# Patient Record
Sex: Female | Born: 1940
Health system: Southern US, Community
[De-identification: ages and names within clinical notes are randomized; demographics above are authoritative.]

## PROBLEM LIST (undated history)

## (undated) DIAGNOSIS — M5030 Other cervical disc degeneration, unspecified cervical region: Secondary | ICD-10-CM

## (undated) DIAGNOSIS — J302 Other seasonal allergic rhinitis: Secondary | ICD-10-CM

## (undated) DIAGNOSIS — S82899A Other fracture of unspecified lower leg, initial encounter for closed fracture: Secondary | ICD-10-CM

## (undated) DIAGNOSIS — I10 Essential (primary) hypertension: Secondary | ICD-10-CM

## (undated) DIAGNOSIS — M1990 Unspecified osteoarthritis, unspecified site: Secondary | ICD-10-CM

## (undated) DIAGNOSIS — G43909 Migraine, unspecified, not intractable, without status migrainosus: Secondary | ICD-10-CM

## (undated) DIAGNOSIS — C4492 Squamous cell carcinoma of skin, unspecified: Secondary | ICD-10-CM

## (undated) DIAGNOSIS — R7303 Prediabetes: Secondary | ICD-10-CM

## (undated) DIAGNOSIS — C801 Malignant (primary) neoplasm, unspecified: Secondary | ICD-10-CM

## (undated) DIAGNOSIS — M4800 Spinal stenosis, site unspecified: Secondary | ICD-10-CM

## (undated) DIAGNOSIS — E039 Hypothyroidism, unspecified: Secondary | ICD-10-CM

## (undated) DIAGNOSIS — T7840XA Allergy, unspecified, initial encounter: Secondary | ICD-10-CM

## (undated) DIAGNOSIS — G629 Polyneuropathy, unspecified: Secondary | ICD-10-CM

## (undated) DIAGNOSIS — K5792 Diverticulitis of intestine, part unspecified, without perforation or abscess without bleeding: Secondary | ICD-10-CM

## (undated) DIAGNOSIS — K3580 Unspecified acute appendicitis: Principal | ICD-10-CM

## (undated) DIAGNOSIS — E7800 Pure hypercholesterolemia, unspecified: Secondary | ICD-10-CM

## (undated) DIAGNOSIS — Z8719 Personal history of other diseases of the digestive system: Secondary | ICD-10-CM

## (undated) HISTORY — DX: Squamous cell carcinoma of skin, unspecified: C44.92

## (undated) HISTORY — DX: Diverticulitis of intestine, part unspecified, without perforation or abscess without bleeding: K57.92

## (undated) HISTORY — PX: TONSILLECTOMY: SUR1361

## (undated) HISTORY — DX: Unspecified osteoarthritis, unspecified site: M19.90

## (undated) HISTORY — DX: Allergy, unspecified, initial encounter: T78.40XA

## (undated) HISTORY — PX: CARPAL TUNNEL RELEASE: SHX101

## (undated) HISTORY — PX: OTHER SURGICAL HISTORY: SHX169

## (undated) HISTORY — PX: LAPAROSCOPIC TUBAL LIGATION: SUR803

## (undated) HISTORY — DX: Other seasonal allergic rhinitis: J30.2

## (undated) HISTORY — DX: Polyneuropathy, unspecified: G62.9

## (undated) HISTORY — PX: TUBAL LIGATION: SHX77

## (undated) HISTORY — DX: Spinal stenosis, site unspecified: M48.00

## (undated) HISTORY — PX: GANGLION CYST EXCISION: SHX1691

## (undated) HISTORY — DX: Other cervical disc degeneration, unspecified cervical region: M50.30

## (undated) HISTORY — DX: Unspecified acute appendicitis: K35.80

## (undated) HISTORY — PX: APPENDECTOMY: SHX54

---

## 1976-04-27 HISTORY — PX: THYROIDECTOMY: SHX17

## 1997-11-22 ENCOUNTER — Ambulatory Visit (HOSPITAL_COMMUNITY): Admission: RE | Admit: 1997-11-22 | Discharge: 1997-11-22 | Payer: Self-pay | Admitting: *Deleted

## 1998-08-30 ENCOUNTER — Ambulatory Visit (HOSPITAL_COMMUNITY): Admission: RE | Admit: 1998-08-30 | Discharge: 1998-08-30 | Payer: Self-pay | Admitting: Neurosurgery

## 1998-09-16 ENCOUNTER — Ambulatory Visit (HOSPITAL_COMMUNITY): Admission: RE | Admit: 1998-09-16 | Discharge: 1998-09-16 | Payer: Self-pay | Admitting: Neurosurgery

## 1998-10-04 ENCOUNTER — Ambulatory Visit (HOSPITAL_COMMUNITY): Admission: RE | Admit: 1998-10-04 | Discharge: 1998-10-04 | Payer: Self-pay | Admitting: Neurosurgery

## 1998-11-05 ENCOUNTER — Other Ambulatory Visit: Admission: RE | Admit: 1998-11-05 | Discharge: 1998-11-05 | Payer: Self-pay | Admitting: Neurosurgery

## 1999-08-19 ENCOUNTER — Other Ambulatory Visit: Admission: RE | Admit: 1999-08-19 | Discharge: 1999-08-19 | Payer: Self-pay | Admitting: Family Medicine

## 2004-04-27 HISTORY — PX: ANKLE FUSION: SHX881

## 2004-05-27 ENCOUNTER — Ambulatory Visit (HOSPITAL_BASED_OUTPATIENT_CLINIC_OR_DEPARTMENT_OTHER): Admission: RE | Admit: 2004-05-27 | Discharge: 2004-05-27 | Payer: Self-pay | Admitting: Orthopedic Surgery

## 2004-10-25 DIAGNOSIS — Z8719 Personal history of other diseases of the digestive system: Secondary | ICD-10-CM

## 2004-10-25 HISTORY — DX: Personal history of other diseases of the digestive system: Z87.19

## 2004-11-27 ENCOUNTER — Encounter: Admission: RE | Admit: 2004-11-27 | Discharge: 2004-11-27 | Payer: Self-pay | Admitting: Gastroenterology

## 2007-10-04 ENCOUNTER — Other Ambulatory Visit: Admission: RE | Admit: 2007-10-04 | Discharge: 2007-10-04 | Payer: Self-pay | Admitting: Family Medicine

## 2010-09-12 NOTE — Op Note (Signed)
NAME:  Margaret Guerrero, Margaret Guerrero                   ACCOUNT NO.:  [id number]   MEDICAL RECORD NO.:  [id number]          PATIENT TYPE:  AMB   LOCATION:  DSC                          FACILITY:  [facility]   PHYSICIAN:  Leonides Grills, M.D.     DATE OF BIRTH:  [birth year]   DATE OF PROCEDURE:  05/27/2004  DATE OF DISCHARGE:                                 OPERATIVE REPORT   PREOPERATIVE DIAGNOSES:  1.  Posttraumatic left ankle arthritis.  2.  Left tight Achilles tendon.  3.  Left tibial spurs.  4.  Complication hardware, ankle.  5.  Hardware removal deep, left ankle.   POSTOPERATIVE DIAGNOSES:  1.  Posttraumatic left ankle arthritis.  2.  Left tight Achilles tendon.  3.  Left tibial spurs.  4.  Complication hardware, ankle.  5.  Hardware removal deep, left ankle.   OPERATION:  1.  Left ankle fusion.  2.  Left local bone graft.  3.  Percutaneous left tendo Achillis lengthening.  4.  Stress x-rays of the left ankle.  5.  Excision of left tibial spurs.   ANESTHESIA:  General endotracheal anesthesia.   SURGEON:  Leonides Grills, M.D.   ASSISTANT:  Lianne Cure, P.A.C.   ESTIMATED BLOOD LOSS:  Minimal.   TOURNIQUET TIME:  Approximately 1-1/2 hours.   COMPLICATIONS:  None.   DISPOSITION:  Stable to the PAR.   INDICATIONS:  This is a 70 year old female who had a bimalleolar ankle  fracture who underwent open reduction, internal fixation and postoperatively  she developed advanced arthritis with varus tilt.  She consented to the  above procedure.  All risks which include infection, neurovessel injury,  nonunion, malunion, heart or irritation heart failure, precipitation  worsening, precipitation of arthritis, neighboring joints are all explained.  Questions were encouraged to answer.   PROCEDURE:  The patient was brought to the operating room and placed in the  supine position after adequate general endotracheal anesthesia was  administered as well as Ancef 1 gm IV piggyback.  The left lower  extremity  is then prepped and draped in a sterile manner over proximally placed  tourniquet; limb was gravity exsanguinated, and tourniquet was elevated to  290 mmHg.  A longitudinal incision over the lateral aspect of the ankle  which was previously made and done.  Dissection was carried down directly to  bone.  The plate and screws were then identified as well as the K-wire.  These were all removed. They were small-fragment screws. After the hardware  was removed we then measured about 6 cm proximal to the tip of the lateral  malleolus.  An osteotomy was then made. A block of bone was then resected.   The syndesmotic ligament was then released.  The fibula was then externally  rotated.  The soft tissue was then removed from the lateral aspect of the  tibia and multiple drill holes were then placed. Cartilage was then removed  from within the ankle joint with curette as well as curved 4-inch osteotome  and synovectomy rongeur.  Once this was all removed and denuded of cartilage  multiple 10-mm drill holes were placed on either side of the joint,  respectively.  The anterior distal osteophyte off the tibia was then removed  with the curved 4-inch osteotome and a rongeur that took quite some time.  This was protecting the soft tissues anteriorly as well. Once this was  completely removed we then performed a percutaneous tendo Achillis  lengthening with 2 medial and 1 lateral hemi-sections of the Achilles  tendon.  This had excellent release of the tight Achilles tendon.   After this was removed, we then dorsiflexed the ankle and posteriorly  translated talus and provisionally fixed this with a 2.0-mm K-wire.  This  was found, under C-arm guidance, in the AP planes to be in excellent  position.   A longitudinal incision was made over the medial aspect of the ankle with  nick-and-spread technique was utilized down to the metaphyseal curvature of  the medial aspect of the tibia.  Then  4.5/3.5-mm drill holes were placed.  Then a 16-mm, thread 6.5-mm cancellous screw was then placed.  This had  excellent compression across the arthrodesis site.   We then removed the 2-0 K-wire and replaced this with a 6.5-mm/16-mm  threaded cancellous screws using 4.5/3.2-mm drill holes respectively.  This  had excellent purchase and maintenance of the correction. We then took a  sagittal saw and cut the fibula in a sagittal plane removing the medial  third of the fibula.  This was used as local bone graft as well as the  osteophytes taken from the distal anterior tibia, as well as from drill bits  as well.  We then opposed the laterally malleolus as a biological plate and  placed two, 33-mm/6.5-mm cancellous screws. This had excellent purchase and  compression as a biological plate.  Ines Bloomer was used to create stress-straining  relieving areas and local bone graft was packed into all these areas as  well.  Final stress x-rays were obtained, AP and lateral mortise views.  There was no gross motion across the arthrodesis site.  Fixation was in a  proper position as well.  The area was copiously irrigated with normal  saline.  Tourniquet was deflated, hemostasis was obtained.  Subcu was closed  with 3-0 Vicryl, skin was closed with 4-0 nylon.  Sterile dressing was  applied.  Modified Jones dressing was applied.  The patient was stable to  PAR.      PB/MEDQ  D:  05/27/2004  T:  05/27/2004  Job:  [id number]

## 2012-04-25 ENCOUNTER — Inpatient Hospital Stay: Admit: 2012-04-25 | Payer: Self-pay | Admitting: Orthopedic Surgery

## 2012-04-25 SURGERY — ARTHROPLASTY, KNEE, TOTAL
Anesthesia: Spinal | Laterality: Right

## 2012-04-25 SURGERY — Surgical Case
Anesthesia: *Unknown

## 2012-08-21 ENCOUNTER — Encounter (HOSPITAL_COMMUNITY): Payer: Self-pay | Admitting: Emergency Medicine

## 2012-08-21 ENCOUNTER — Emergency Department (HOSPITAL_COMMUNITY): Payer: Federal, State, Local not specified - PPO

## 2012-08-21 ENCOUNTER — Encounter (HOSPITAL_COMMUNITY)
Admission: EM | Disposition: A | Discharge status: Home / Self Care | Payer: Self-pay | Source: Home / Self Care | Attending: Emergency Medicine

## 2012-08-21 ENCOUNTER — Observation Stay (HOSPITAL_COMMUNITY)
Admission: EM | Admit: 2012-08-21 | Discharge: 2012-08-22 | Disposition: A | Discharge status: Home / Self Care | Payer: Federal, State, Local not specified - PPO | Attending: Surgery | Admitting: Surgery

## 2012-08-21 ENCOUNTER — Emergency Department (HOSPITAL_COMMUNITY): Payer: Federal, State, Local not specified - PPO | Admitting: Anesthesiology

## 2012-08-21 ENCOUNTER — Encounter (HOSPITAL_COMMUNITY): Payer: Self-pay | Admitting: Anesthesiology

## 2012-08-21 DIAGNOSIS — E7800 Pure hypercholesterolemia, unspecified: Secondary | ICD-10-CM | POA: Insufficient documentation

## 2012-08-21 DIAGNOSIS — I10 Essential (primary) hypertension: Secondary | ICD-10-CM | POA: Insufficient documentation

## 2012-08-21 DIAGNOSIS — K3580 Unspecified acute appendicitis: Secondary | ICD-10-CM

## 2012-08-21 DIAGNOSIS — Z79899 Other long term (current) drug therapy: Secondary | ICD-10-CM | POA: Insufficient documentation

## 2012-08-21 DIAGNOSIS — R1031 Right lower quadrant pain: Secondary | ICD-10-CM | POA: Insufficient documentation

## 2012-08-21 HISTORY — PX: LAPAROSCOPIC APPENDECTOMY: SHX408

## 2012-08-21 HISTORY — DX: Other fracture of unspecified lower leg, initial encounter for closed fracture: S82.899A

## 2012-08-21 HISTORY — DX: Essential (primary) hypertension: I10

## 2012-08-21 HISTORY — DX: Unspecified acute appendicitis: K35.80

## 2012-08-21 HISTORY — DX: Hypothyroidism, unspecified: E03.9

## 2012-08-21 HISTORY — DX: Pure hypercholesterolemia, unspecified: E78.00

## 2012-08-21 LAB — URINALYSIS, ROUTINE W REFLEX MICROSCOPIC
Bilirubin Urine: NEGATIVE
Hgb urine dipstick: NEGATIVE
Nitrite: NEGATIVE
Specific Gravity, Urine: 1.019 (ref 1.005–1.030)
pH: 6.5 (ref 5.0–8.0)

## 2012-08-21 LAB — CBC WITH DIFFERENTIAL/PLATELET
Basophils Absolute: 0 10*3/uL (ref 0.0–0.1)
Basophils Relative: 0 % (ref 0–1)
Eosinophils Absolute: 0 10*3/uL (ref 0.0–0.7)
MCH: 28 pg (ref 26.0–34.0)
MCHC: 33.6 g/dL (ref 30.0–36.0)
Neutrophils Relative %: 88 % — ABNORMAL HIGH (ref 43–77)
Platelets: 215 10*3/uL (ref 150–400)
RBC: 4.9 MIL/uL (ref 3.87–5.11)
RDW: 13.7 % (ref 11.5–15.5)

## 2012-08-21 LAB — COMPREHENSIVE METABOLIC PANEL
ALT: 14 U/L (ref 0–35)
Albumin: 4 g/dL (ref 3.5–5.2)
Alkaline Phosphatase: 68 U/L (ref 39–117)
Potassium: 4.1 mEq/L (ref 3.5–5.1)
Sodium: 134 mEq/L — ABNORMAL LOW (ref 135–145)
Total Protein: 7.7 g/dL (ref 6.0–8.3)

## 2012-08-21 LAB — LIPASE, BLOOD: Lipase: 29 U/L (ref 11–59)

## 2012-08-21 SURGERY — APPENDECTOMY, LAPAROSCOPIC
Anesthesia: General | Site: Abdomen | Wound class: Clean Contaminated

## 2012-08-21 MED ORDER — LACTATED RINGERS IR SOLN
Status: DC | PRN
  Administered 2012-08-21: 1000 mL
  Administered 2012-08-21: 3000 mL

## 2012-08-21 MED ORDER — METOPROLOL TARTRATE 12.5 MG HALF TABLET
12.5000 mg | ORAL_TABLET | Freq: Two times a day (BID) | ORAL | Status: DC | PRN
  Filled 2012-08-21: qty 1

## 2012-08-21 MED ORDER — SODIUM CHLORIDE 0.9 % IV SOLN
INTRAVENOUS | Status: DC
  Administered 2012-08-21: 11:00:00 via INTRAVENOUS

## 2012-08-21 MED ORDER — SUCCINYLCHOLINE CHLORIDE 20 MG/ML IJ SOLN
INTRAMUSCULAR | Status: DC | PRN
  Administered 2012-08-21: 60 mg via INTRAVENOUS

## 2012-08-21 MED ORDER — VITAMIN C 250 MG PO TABS
250.0000 mg | ORAL_TABLET | Freq: Every day | ORAL | Status: DC
  Administered 2012-08-21: 250 mg via ORAL
  Filled 2012-08-21 (×2): qty 1

## 2012-08-21 MED ORDER — HYDROMORPHONE HCL PF 1 MG/ML IJ SOLN: 0.5000 mg | INTRAMUSCULAR | Status: DC | PRN

## 2012-08-21 MED ORDER — GENTAMICIN SULFATE 40 MG/ML IJ SOLN
480.0000 mg | INTRAMUSCULAR | Status: DC
  Administered 2012-08-21: 480 mg via INTRAVENOUS
  Filled 2012-08-21 (×2): qty 12

## 2012-08-21 MED ORDER — CISATRACURIUM BESYLATE (PF) 10 MG/5ML IV SOLN
INTRAVENOUS | Status: DC | PRN
  Administered 2012-08-21: 4 mg via INTRAVENOUS

## 2012-08-21 MED ORDER — ONDANSETRON HCL 4 MG/2ML IJ SOLN: 4.0000 mg | Freq: Four times a day (QID) | INTRAMUSCULAR | Status: DC | PRN

## 2012-08-21 MED ORDER — CYCLOBENZAPRINE HCL 10 MG PO TABS: 10.0000 mg | ORAL_TABLET | Freq: Three times a day (TID) | ORAL | Status: DC | PRN

## 2012-08-21 MED ORDER — CLINDAMYCIN PHOSPHATE 600 MG/50ML IV SOLN
600.0000 mg | Freq: Four times a day (QID) | INTRAVENOUS | Status: DC
  Administered 2012-08-21 – 2012-08-22 (×3): 600 mg via INTRAVENOUS
  Filled 2012-08-21 (×5): qty 50

## 2012-08-21 MED ORDER — SODIUM CHLORIDE 0.9 % IJ SOLN: 3.0000 mL | INTRAMUSCULAR | Status: DC | PRN

## 2012-08-21 MED ORDER — NEOSTIGMINE METHYLSULFATE 1 MG/ML IJ SOLN
INTRAMUSCULAR | Status: DC | PRN
  Administered 2012-08-21: 3.5 mg via INTRAVENOUS

## 2012-08-21 MED ORDER — ONDANSETRON 4 MG PO TBDP: 4.0000 mg | ORAL_TABLET | Freq: Once | ORAL | Status: DC

## 2012-08-21 MED ORDER — HYDROMORPHONE HCL PF 1 MG/ML IJ SOLN
INTRAMUSCULAR | Status: DC | PRN
  Administered 2012-08-21 (×2): .4 mg via INTRAVENOUS

## 2012-08-21 MED ORDER — LACTATED RINGERS IV SOLN
INTRAVENOUS | Status: DC | PRN
  Administered 2012-08-21: 16:00:00 via INTRAVENOUS

## 2012-08-21 MED ORDER — ONDANSETRON HCL 4 MG/2ML IJ SOLN
INTRAMUSCULAR | Status: DC | PRN
  Administered 2012-08-21: 4 mg via INTRAVENOUS

## 2012-08-21 MED ORDER — FENTANYL CITRATE 0.05 MG/ML IJ SOLN
25.0000 ug | INTRAMUSCULAR | Status: DC | PRN
Start: 2012-08-21 — End: 2012-08-21

## 2012-08-21 MED ORDER — HYDROMORPHONE HCL PF 1 MG/ML IJ SOLN
0.5000 mg | Freq: Once | INTRAMUSCULAR | Status: AC
  Administered 2012-08-21: 0.5 mg via INTRAVENOUS
  Filled 2012-08-21: qty 1

## 2012-08-21 MED ORDER — GLYCOPYRROLATE 0.2 MG/ML IJ SOLN
INTRAMUSCULAR | Status: DC | PRN
  Administered 2012-08-21: .5 mg via INTRAVENOUS

## 2012-08-21 MED ORDER — IOHEXOL 300 MG/ML  SOLN
50.0000 mL | Freq: Once | INTRAMUSCULAR | Status: AC | PRN
  Administered 2012-08-21: 50 mL via ORAL

## 2012-08-21 MED ORDER — ACETAMINOPHEN 10 MG/ML IV SOLN
INTRAVENOUS | Status: AC
  Filled 2012-08-21: qty 100

## 2012-08-21 MED ORDER — BUPIVACAINE-EPINEPHRINE 0.25% -1:200000 IJ SOLN
INTRAMUSCULAR | Status: AC
  Filled 2012-08-21: qty 1

## 2012-08-21 MED ORDER — CLINDAMYCIN PHOSPHATE 600 MG/50ML IV SOLN: 600.0000 mg | INTRAVENOUS | Status: DC

## 2012-08-21 MED ORDER — FENTANYL CITRATE 0.05 MG/ML IJ SOLN: 25.0000 ug | INTRAMUSCULAR | Status: DC | PRN

## 2012-08-21 MED ORDER — SACCHAROMYCES BOULARDII 250 MG PO CAPS
250.0000 mg | ORAL_CAPSULE | Freq: Two times a day (BID) | ORAL | Status: DC
  Administered 2012-08-21 – 2012-08-22 (×2): 250 mg via ORAL
  Filled 2012-08-21 (×3): qty 1

## 2012-08-21 MED ORDER — GENTAMICIN SULFATE 40 MG/ML IJ SOLN: 2.0000 mg/kg | Freq: Two times a day (BID) | INTRAVENOUS | Status: DC

## 2012-08-21 MED ORDER — CHLORHEXIDINE GLUCONATE 4 % EX LIQD
1.0000 "application " | Freq: Once | CUTANEOUS | Status: DC
  Filled 2012-08-21: qty 15

## 2012-08-21 MED ORDER — CALCIUM CARBONATE 1250 (500 CA) MG PO TABS
1.0000 | ORAL_TABLET | Freq: Every day | ORAL | Status: DC
  Administered 2012-08-22: 500 mg via ORAL
  Filled 2012-08-21: qty 1

## 2012-08-21 MED ORDER — ONDANSETRON HCL 4 MG PO TABS: 4.0000 mg | ORAL_TABLET | Freq: Four times a day (QID) | ORAL | Status: DC | PRN

## 2012-08-21 MED ORDER — OXYCODONE HCL 5 MG PO TABS
5.0000 mg | ORAL_TABLET | ORAL | Status: DC | PRN
  Administered 2012-08-22: 5 mg via ORAL
  Filled 2012-08-21 (×2): qty 1

## 2012-08-21 MED ORDER — FENTANYL CITRATE 0.05 MG/ML IJ SOLN
INTRAMUSCULAR | Status: DC | PRN
  Administered 2012-08-21: 100 ug via INTRAVENOUS

## 2012-08-21 MED ORDER — PROPOFOL 10 MG/ML IV BOLUS
INTRAVENOUS | Status: DC | PRN
  Administered 2012-08-21: 130 mg via INTRAVENOUS

## 2012-08-21 MED ORDER — ACETAMINOPHEN 325 MG PO TABS: 325.0000 mg | ORAL_TABLET | Freq: Four times a day (QID) | ORAL | Status: DC | PRN

## 2012-08-21 MED ORDER — DEXTROSE IN LACTATED RINGERS 5 % IV SOLN: INTRAVENOUS | Status: DC

## 2012-08-21 MED ORDER — METRONIDAZOLE IN NACL 5-0.79 MG/ML-% IV SOLN: 500.0000 mg | Freq: Once | INTRAVENOUS | Status: DC

## 2012-08-21 MED ORDER — MAGNESIUM HYDROXIDE 400 MG/5ML PO SUSP: 30.0000 mL | Freq: Two times a day (BID) | ORAL | Status: DC | PRN

## 2012-08-21 MED ORDER — SODIUM CHLORIDE 0.9 % IV SOLN: 250.0000 mL | INTRAVENOUS | Status: DC | PRN

## 2012-08-21 MED ORDER — ACETAMINOPHEN 10 MG/ML IV SOLN
INTRAVENOUS | Status: DC | PRN
  Administered 2012-08-21: 1000 mg via INTRAVENOUS

## 2012-08-21 MED ORDER — IOHEXOL 300 MG/ML  SOLN
100.0000 mL | Freq: Once | INTRAMUSCULAR | Status: AC | PRN
  Administered 2012-08-21: 100 mL via INTRAVENOUS

## 2012-08-21 MED ORDER — LACTATED RINGERS IV BOLUS (SEPSIS): 1000.0000 mL | Freq: Three times a day (TID) | INTRAVENOUS | Status: DC | PRN

## 2012-08-21 MED ORDER — LIDOCAINE HCL (CARDIAC) 20 MG/ML IV SOLN
INTRAVENOUS | Status: DC | PRN
  Administered 2012-08-21: 100 mg via INTRAVENOUS

## 2012-08-21 MED ORDER — BISACODYL 10 MG RE SUPP: 10.0000 mg | Freq: Two times a day (BID) | RECTAL | Status: DC | PRN

## 2012-08-21 MED ORDER — ALUM & MAG HYDROXIDE-SIMETH 200-200-20 MG/5ML PO SUSP: 30.0000 mL | Freq: Four times a day (QID) | ORAL | Status: DC | PRN

## 2012-08-21 MED ORDER — GENTAMICIN IN SALINE 1-0.9 MG/ML-% IV SOLN
100.0000 mg | INTRAVENOUS | Status: AC
  Administered 2012-08-21: 100 mg via INTRAVENOUS
  Filled 2012-08-21: qty 100

## 2012-08-21 MED ORDER — DEXTROSE 5 % IV SOLN: 1.0000 g | Freq: Once | INTRAVENOUS | Status: DC

## 2012-08-21 MED ORDER — CLINDAMYCIN PHOSPHATE 600 MG/50ML IV SOLN
600.0000 mg | INTRAVENOUS | Status: AC
  Administered 2012-08-21: 600 mg via INTRAVENOUS
  Filled 2012-08-21: qty 50

## 2012-08-21 MED ORDER — LIP MEDEX EX OINT: 1.0000 "application " | TOPICAL_OINTMENT | Freq: Two times a day (BID) | CUTANEOUS | Status: DC

## 2012-08-21 MED ORDER — KCL IN DEXTROSE-NACL 40-5-0.9 MEQ/L-%-% IV SOLN
INTRAVENOUS | Status: DC
  Administered 2012-08-21: 19:00:00 via INTRAVENOUS
  Filled 2012-08-21 (×4): qty 1000

## 2012-08-21 MED ORDER — BUPIVACAINE-EPINEPHRINE 0.25% -1:200000 IJ SOLN
INTRAMUSCULAR | Status: DC | PRN
  Administered 2012-08-21: 50 mL

## 2012-08-21 MED ORDER — IBUPROFEN 800 MG PO TABS
800.0000 mg | ORAL_TABLET | Freq: Four times a day (QID) | ORAL | Status: DC
  Administered 2012-08-21 – 2012-08-22 (×4): 800 mg via ORAL
  Filled 2012-08-21 (×6): qty 1

## 2012-08-21 MED ORDER — ONDANSETRON HCL 4 MG/2ML IJ SOLN
4.0000 mg | Freq: Once | INTRAMUSCULAR | Status: AC
  Administered 2012-08-21: 4 mg via INTRAVENOUS
  Filled 2012-08-21: qty 2

## 2012-08-21 MED ORDER — HEPARIN SODIUM (PORCINE) 5000 UNIT/ML IJ SOLN
5000.0000 [IU] | Freq: Three times a day (TID) | INTRAMUSCULAR | Status: DC
  Administered 2012-08-22: 5000 [IU] via SUBCUTANEOUS
  Filled 2012-08-21 (×4): qty 1

## 2012-08-21 MED ORDER — MAGIC MOUTHWASH
15.0000 mL | Freq: Four times a day (QID) | ORAL | Status: DC | PRN
  Filled 2012-08-21: qty 15

## 2012-08-21 MED ORDER — ADULT MULTIVITAMIN W/MINERALS CH
1.0000 | ORAL_TABLET | Freq: Every day | ORAL | Status: DC
  Administered 2012-08-22: 1 via ORAL
  Filled 2012-08-21: qty 1

## 2012-08-21 MED ORDER — SODIUM CHLORIDE 0.9 % IJ SOLN: 3.0000 mL | Freq: Two times a day (BID) | INTRAMUSCULAR | Status: DC

## 2012-08-21 MED ORDER — ACETAMINOPHEN 650 MG RE SUPP: 650.0000 mg | Freq: Four times a day (QID) | RECTAL | Status: DC | PRN

## 2012-08-21 MED ORDER — PROMETHAZINE HCL 25 MG/ML IJ SOLN: 6.2500 mg | INTRAMUSCULAR | Status: DC | PRN

## 2012-08-21 MED ORDER — KETOROLAC TROMETHAMINE 30 MG/ML IJ SOLN: 15.0000 mg | Freq: Once | INTRAMUSCULAR | Status: DC | PRN

## 2012-08-21 MED ORDER — PROMETHAZINE HCL 25 MG/ML IJ SOLN: 12.5000 mg | Freq: Four times a day (QID) | INTRAMUSCULAR | Status: DC | PRN

## 2012-08-21 MED ORDER — LEVOTHYROXINE SODIUM 50 MCG PO TABS
50.0000 ug | ORAL_TABLET | Freq: Every day | ORAL | Status: DC
  Administered 2012-08-22: 50 ug via ORAL
  Filled 2012-08-21 (×3): qty 1

## 2012-08-21 MED ORDER — LISINOPRIL 10 MG PO TABS
10.0000 mg | ORAL_TABLET | Freq: Every day | ORAL | Status: DC
  Administered 2012-08-22: 10 mg via ORAL
  Filled 2012-08-21: qty 1

## 2012-08-21 MED ORDER — GENTAMICIN IN SALINE 1-0.9 MG/ML-% IV SOLN: 100.0000 mg | INTRAVENOUS | Status: DC

## 2012-08-21 MED ORDER — DIPHENHYDRAMINE HCL 50 MG/ML IJ SOLN: 12.5000 mg | Freq: Four times a day (QID) | INTRAMUSCULAR | Status: DC | PRN

## 2012-08-21 MED ORDER — KETOROLAC TROMETHAMINE 15 MG/ML IJ SOLN
INTRAMUSCULAR | Status: DC | PRN
  Administered 2012-08-21: 15 mg via INTRAVENOUS

## 2012-08-21 SURGICAL SUPPLY — 42 items
APPLIER CLIP 5 13 M/L LIGAMAX5 (MISCELLANEOUS)
APPLIER CLIP ROT 10 11.4 M/L (STAPLE)
CANISTER SUCTION 2500CC (MISCELLANEOUS) ×2 IMPLANT
CLIP APPLIE 5 13 M/L LIGAMAX5 (MISCELLANEOUS) IMPLANT
CLIP APPLIE ROT 10 11.4 M/L (STAPLE) IMPLANT
CLOTH BEACON ORANGE TIMEOUT ST (SAFETY) ×2 IMPLANT
CUTTER FLEX LINEAR 45M (STAPLE) ×2 IMPLANT
DECANTER SPIKE VIAL GLASS SM (MISCELLANEOUS) ×2 IMPLANT
DRAPE LAPAROSCOPIC ABDOMINAL (DRAPES) ×2 IMPLANT
DRAPE UTILITY XL STRL (DRAPES) ×2 IMPLANT
DRSG TEGADERM 2-3/8X2-3/4 SM (GAUZE/BANDAGES/DRESSINGS) ×2 IMPLANT
DRSG TEGADERM 4X4.75 (GAUZE/BANDAGES/DRESSINGS) ×2 IMPLANT
ELECT REM PT RETURN 9FT ADLT (ELECTROSURGICAL) ×2
ELECTRODE REM PT RTRN 9FT ADLT (ELECTROSURGICAL) ×1 IMPLANT
ENDOLOOP SUT PDS II  0 18 (SUTURE)
ENDOLOOP SUT PDS II 0 18 (SUTURE) IMPLANT
GAUZE SPONGE 2X2 8PLY STRL LF (GAUZE/BANDAGES/DRESSINGS) ×1 IMPLANT
GLOVE BIOGEL PI IND STRL 7.0 (GLOVE) ×1 IMPLANT
GLOVE BIOGEL PI INDICATOR 7.0 (GLOVE) ×1
GLOVE ECLIPSE 8.0 STRL XLNG CF (GLOVE) ×2 IMPLANT
GLOVE INDICATOR 8.0 STRL GRN (GLOVE) ×4 IMPLANT
GOWN STRL NON-REIN LRG LVL3 (GOWN DISPOSABLE) ×2 IMPLANT
GOWN STRL REIN XL XLG (GOWN DISPOSABLE) ×6 IMPLANT
KIT BASIN OR (CUSTOM PROCEDURE TRAY) ×2 IMPLANT
NS IRRIG 1000ML POUR BTL (IV SOLUTION) ×2 IMPLANT
PENCIL BUTTON HOLSTER BLD 10FT (ELECTRODE) ×2 IMPLANT
POUCH SPECIMEN RETRIEVAL 10MM (ENDOMECHANICALS) ×2 IMPLANT
RELOAD 45 VASCULAR/THIN (ENDOMECHANICALS) IMPLANT
RELOAD STAPLE TA45 3.5 REG BLU (ENDOMECHANICALS) ×2 IMPLANT
SCALPEL HARMONIC ACE (MISCELLANEOUS) ×2 IMPLANT
SET IRRIG TUBING LAPAROSCOPIC (IRRIGATION / IRRIGATOR) ×2 IMPLANT
SOLUTION ANTI FOG 6CC (MISCELLANEOUS) ×2 IMPLANT
SPONGE GAUZE 2X2 STER 10/PKG (GAUZE/BANDAGES/DRESSINGS) ×1
SUT MNCRL AB 4-0 PS2 18 (SUTURE) ×2 IMPLANT
TOWEL OR 17X26 10 PK STRL BLUE (TOWEL DISPOSABLE) ×2 IMPLANT
TOWEL OR NON WOVEN STRL DISP B (DISPOSABLE) ×2 IMPLANT
TRAY FOLEY CATH 14FRSI W/METER (CATHETERS) ×2 IMPLANT
TRAY LAP CHOLE (CUSTOM PROCEDURE TRAY) ×2 IMPLANT
TROCAR BLADELESS OPT 5 100 (ENDOMECHANICALS) ×2 IMPLANT
TROCAR XCEL 12X100 BLDLESS (ENDOMECHANICALS) ×2 IMPLANT
TROCAR XCEL BLADELESS 5X75MML (TROCAR) ×2 IMPLANT
TUBING INSUFFLATION 10FT LAP (TUBING) ×2 IMPLANT

## 2012-08-21 NOTE — Anesthesia Preprocedure Evaluation (Signed)
Anesthesia Evaluation  Patient identified by MRN, date of birth, ID band Patient awake    Reviewed: Allergy & Precautions, H&P , NPO status , Patient's Chart, lab work & pertinent test results  Airway Mallampati: II TM Distance: >3 FB Neck ROM: Full    Dental no notable dental hx.    Pulmonary neg pulmonary ROS,  breath sounds clear to auscultation  Pulmonary exam normal       Cardiovascular hypertension, Pt. on medications Rhythm:Regular Rate:Normal     Neuro/Psych negative neurological ROS  negative psych ROS   GI/Hepatic negative GI ROS, Neg liver ROS,   Endo/Other  negative endocrine ROS  Renal/GU negative Renal ROS  negative genitourinary   Musculoskeletal negative musculoskeletal ROS (+)   Abdominal   Peds negative pediatric ROS (+)  Hematology negative hematology ROS (+)   Anesthesia Other Findings   Reproductive/Obstetrics negative OB ROS                           Anesthesia Physical Anesthesia Plan  ASA: II and emergent  Anesthesia Plan: General   Post-op Pain Management:    Induction: Intravenous and Rapid sequence  Airway Management Planned: Oral ETT  Additional Equipment:   Intra-op Plan:   Post-operative Plan: Extubation in OR  Informed Consent: I have reviewed the patients History and Physical, chart, labs and discussed the procedure including the risks, benefits and alternatives for the proposed anesthesia with the patient or authorized representative who has indicated his/her understanding and acceptance.   Dental advisory given  Plan Discussed with: CRNA and Surgeon  Anesthesia Plan Comments:         Anesthesia Quick Evaluation

## 2012-08-21 NOTE — H&P (Addendum)
Margaret Guerrero  1941-03-22 [id number]  CARE TEAM:  PCP: Gaye Alken, MD  Outpatient Care Team: Patient Care Team: Gaye Alken, MD as PCP - General (Family Medicine) Barrie Folk, MD as Consulting Physician (Gastroenterology)  Inpatient Treatment Team: Treatment Team: Attending Provider: Ward Givens, MD; Technician: Warren Lacy, NT; Registered Nurse: Concha Pyo Spainhour, RN; Physician Assistant: Jaci Carrel, PA-C; Consulting Physician: Bishop Limbo, MD  This patient is a 72 y.o.female who presents today for surgical evaluation at the request of Jaci Carrel, [place] PA.   Reason for evaluation:  Acute appendicitis  Pleasant elderly woman with only abdominal surgery a tubal ligation.  Began to have diffuse abdominal pain early this morning.  It became more focal to the right lower quadrant.  Decreased appetite and some nausea but no emesis.  Was concerned.   Hesitant to heat.  Came to the emergency room to be evaluated.  No personal nor family history of GI/colon cancer, inflammatory bowel disease, irritable bowel syndrome, allergy such as Celiac Sprue, dietary/dairy problems, colitis, ulcers nor gastritis.  No recent sick contacts/gastroenteritis.  No travel outside the country.  No changes in diet.  No heartburn or reflux.  No hematemesis.  No BRBPR.  No bad diarrhea.  Normally has a bowel movement every day or so     Past Medical History  Diagnosis Date  . Ankle fracture   . Hypertension   . Hypercholesteremia     Past Surgical History  Procedure Laterality Date  . Thyroidectomy  1978  . Abdominal hysterectomy    . Ankle fusion  2006    History   Social History  . Marital Status: Divorced    Spouse Name: N/A    Number of Children: N/A  . Years of Education: N/A   Occupational History  . Not on file.   Social History Main Topics  . Smoking status: Never Smoker   . Smokeless tobacco: Not on file  . Alcohol Use: Yes  . Drug Use: No  . Sexually  Active: Yes   Other Topics Concern  . Not on file   Social History Narrative  . No narrative on file    History reviewed. No pertinent family history.  Current Facility-Administered Medications  Medication Dose Route Frequency Provider Last Rate Last Dose  . 0.9 %  sodium chloride infusion   Intravenous Continuous Lisette Paz, PA-C      . [START ON 08/22/2012] chlorhexidine (HIBICLENS) 4 % liquid 1 application  1 application Topical Once Ardeth Sportsman, MD      . Melene Muller ON 08/22/2012] clindamycin (CLEOCIN) IVPB 600 mg  600 mg Intravenous On Call to OR Ardeth Sportsman, MD      . fentaNYL (SUBLIMAZE) injection 25-50 mcg  25-50 mcg Intravenous Q1H PRN Ardeth Sportsman, MD      . Melene Muller ON 08/22/2012] gentamicin (GARAMYCIN) IVPB 100 mg  100 mg Intravenous On Call to OR Ardeth Sportsman, MD      . HYDROmorphone (DILAUDID) injection 0.5 mg  0.5 mg Intravenous Once Lisette Paz, PA-C      . ondansetron (ZOFRAN) injection 4 mg  4 mg Intravenous Once Lisette Paz, PA-C      . ondansetron (ZOFRAN) injection 4-8 mg  4-8 mg Intravenous Q6H PRN Ardeth Sportsman, MD       Current Outpatient Prescriptions  Medication Sig Dispense Refill  . calcium carbonate (OS-CAL - DOSED IN MG OF ELEMENTAL CALCIUM) 1250 MG tablet Take 1 tablet  by mouth daily.      . cyclobenzaprine (FLEXERIL) 10 MG tablet Take 10 mg by mouth 3 (three) times daily as needed for muscle spasms.      Marland Kitchen ibuprofen (ADVIL,MOTRIN) 200 MG tablet Take 200 mg by mouth every 6 (six) hours as needed for pain.      Marland Kitchen levothyroxine (SYNTHROID, LEVOTHROID) 50 MCG tablet Take 50 mcg by mouth daily before breakfast.      . lisinopril (PRINIVIL,ZESTRIL) 10 MG tablet Take 10 mg by mouth daily.      . Multiple Vitamin (MULTIVITAMIN WITH MINERALS) TABS Take 1 tablet by mouth daily.      . vitamin C (ASCORBIC ACID) 250 MG tablet Take 250 mg by mouth daily.         Allergies  Allergen Reactions  . Penicillins Rash    ROS: Constitutional:  No fevers,  chills, sweats.  Weight stable Eyes:  No vision changes, No discharge HENT:  No sore throats, nasal drainage Lymph: No neck swelling, No bruising easily Pulmonary:  No cough, productive sputum CV: No orthopnea, PND  Patient walks 20 minutes for about 1/2 miles without difficulty.  No exertional chest/neck/shoulder/arm pain. GI: No personal nor family history of GI/colon cancer, inflammatory bowel disease, irritable bowel syndrome, allergy such as Celiac Sprue, dietary/dairy problems, colitis, ulcers nor gastritis.  No recent sick contacts/gastroenteritis.  No travel outside the country.  No changes in diet. Renal: No UTIs, No hematuria Genital:  No drainage, bleeding, masses Musculoskeletal: No severe joint pain.  Good ROM major joints Skin:  No sores or lesions.  No rashes Heme/Lymph:  No easy bleeding.  No swollen lymph nodes Neuro: No focal weakness/numbness.  No seizures Psych: No suicidal ideation.  No hallucinations  BP 132/74  Pulse 92  Temp(Src) 98.3 F (36.8 C) (Oral)  Resp 16  SpO2 100%  Physical Exam: General: Pt awake/alert/oriented x4 in no major acute distress Eyes: PERRL, normal EOM. Sclera nonicteric Neuro: CN II-XII intact w/o focal sensory/motor deficits. Lymph: No head/neck/groin lymphadenopathy Psych:  No delerium/psychosis/paranoia.  Joking HENT: Normocephalic, Mucus membranes moist.  No thrush Neck: Supple, No tracheal deviation Chest: No pain.  Good respiratory excursion. CV:  Pulses intact.  Regular rhythm Abdomen: Soft, Nondistended.  Tender RLQ over McBurney's point.  No diffuse peritonitis.  No incarcerated hernias. Ext:  SCDs BLE.  No significant edema.  No cyanosis Skin: No petechiae / purpurea.  No major sores Musculoskeletal: No severe joint pain.  Good ROM major joints   Results:   Labs: Results for orders placed during the hospital encounter of 08/21/12 (from the past 48 hour(s))  CBC WITH DIFFERENTIAL     Status: Abnormal   Collection Time     08/21/12 10:26 AM      Result Value Range   WBC 13.2 (*) 4.0 - 10.5 K/uL   RBC 4.90  3.87 - 5.11 MIL/uL   Hemoglobin 13.7  12.0 - 15.0 g/dL   HCT 45.4  09.8 - 11.9 %   MCV 83.3  78.0 - 100.0 fL   MCH 28.0  26.0 - 34.0 pg   MCHC 33.6  30.0 - 36.0 g/dL   RDW 14.7  82.9 - 56.2 %   Platelets 215  150 - 400 K/uL   Neutrophils Relative 88 (*) 43 - 77 %   Neutro Abs 11.7 (*) 1.7 - 7.7 K/uL   Lymphocytes Relative 7 (*) 12 - 46 %   Lymphs Abs 0.9  0.7 - 4.0 K/uL  Monocytes Relative 4  3 - 12 %   Monocytes Absolute 0.6  0.1 - 1.0 K/uL   Eosinophils Relative 0  0 - 5 %   Eosinophils Absolute 0.0  0.0 - 0.7 K/uL   Basophils Relative 0  0 - 1 %   Basophils Absolute 0.0  0.0 - 0.1 K/uL  COMPREHENSIVE METABOLIC PANEL     Status: Abnormal   Collection Time    08/21/12 10:26 AM      Result Value Range   Sodium 134 (*) 135 - 145 mEq/L   Potassium 4.1  3.5 - 5.1 mEq/L   Chloride 98  96 - 112 mEq/L   CO2 27  19 - 32 mEq/L   Glucose, Bld 121 (*) 70 - 99 mg/dL   BUN 15  6 - 23 mg/dL   Creatinine, Ser 4.54  0.50 - 1.10 mg/dL   Calcium 9.6  8.4 - 09.8 mg/dL   Total Protein 7.7  6.0 - 8.3 g/dL   Albumin 4.0  3.5 - 5.2 g/dL   AST 17  0 - 37 U/L   ALT 14  0 - 35 U/L   Alkaline Phosphatase 68  39 - 117 U/L   Total Bilirubin 0.8  0.3 - 1.2 mg/dL   GFR calc non Af Amer 85 (*) >90 mL/min   GFR calc Af Amer >90  >90 mL/min   Comment:            The eGFR has been calculated     using the CKD EPI equation.     This calculation has not been     validated in all clinical     situations.     eGFR's persistently     <90 mL/min signify     possible Chronic Kidney Disease.  LIPASE, BLOOD     Status: None   Collection Time    08/21/12 10:26 AM      Result Value Range   Lipase 29  11 - 59 U/L  URINALYSIS, ROUTINE W REFLEX MICROSCOPIC     Status: None   Collection Time    08/21/12 11:53 AM      Result Value Range   Color, Urine YELLOW  YELLOW   APPearance CLEAR  CLEAR   Specific Gravity, Urine  1.019  1.005 - 1.030   pH 6.5  5.0 - 8.0   Glucose, UA NEGATIVE  NEGATIVE mg/dL   Hgb urine dipstick NEGATIVE  NEGATIVE   Bilirubin Urine NEGATIVE  NEGATIVE   Ketones, ur NEGATIVE  NEGATIVE mg/dL   Protein, ur NEGATIVE  NEGATIVE mg/dL   Urobilinogen, UA 0.2  0.0 - 1.0 mg/dL   Nitrite NEGATIVE  NEGATIVE   Leukocytes, UA NEGATIVE  NEGATIVE   Comment: MICROSCOPIC NOT DONE ON URINES WITH NEGATIVE PROTEIN, BLOOD, LEUKOCYTES, NITRITE, OR GLUCOSE <1000 mg/dL.    Imaging / Studies: Ct Abdomen Pelvis W Contrast  08/21/2012  *RADIOLOGY REPORT*  Clinical Data: Right lower quadrant abdominal pain, elevated white blood cell count  CT ABDOMEN AND PELVIS WITH CONTRAST  Technique:  Multidetector CT imaging of the abdomen and pelvis was performed following the standard protocol during bolus administration of intravenous contrast.  Contrast: 50mL OMNIPAQUE IOHEXOL 300 MG/ML  SOLN, OMNIPAQUE IOHEXOL 300 MG/ML  SOLN  Comparison: None.  Findings:  The appendix is enlarged, measuring approximately 1.4 cm in greatest transverse axial dimension (image 53, series 2) and contains a large appendicolith. These findings are associated with a minimal amount of peri  appendiceal stranding.  No definable/drainable fluid collection.  Ingested enteric contrast extends to the level of the distal small bowel.  No evidence of enteric obstruction.  Scattered colonic diverticulosis without evidence of diverticulitis.  No pneumoperitoneum, pneumatosis or portal venous gas.  Normal hepatic contour.  No discrete hepatic lesions.  Normal gallbladder.  No intra or extra panic biliary ductal dilatation. No ascites.  There is symmetric enhancement and excretion of the bilateral kidneys.  Incidental note is made of an exaggerated horizontal line of the right kidney.  There are indeterminate sub centimeter hypoattenuating lesions within the anterior/inferior aspect of the right kidney (image 23 as well as the superior pole left kidney (image  11) which is too small to accurate characterize O favored to represent renal cysts.  No definite renal stones on the post contrast examination.  There is a minimal amount of symmetric likely age related perinephric stranding.  No urinary obstruction.  Normal appearance of the bilateral adrenal glands.  Punctate calcification within the spleen, the sequela of prior granulomatous infection. There is a small fat-containing cleft within the mid aspect of the pancreatic parenchyma (image 25, series 2; coronal image 33, series five) without discrete nodule. No pancreatic ductal dilatation, pancreatic atrophy or peripancreatic stranding.  Scattered minimal atherosclerotic plaque within normal caliber abdominal aorta.  The major branch vessels of the abdominal aorta appear patent on this nine CT examination.  Incidental note is made of a circumaortic left sided renal vein.  No retroperitoneal, mesenteric, pelvic or inguinal lymphadenopathy.  Pelvic organs are normal for age.  No discrete adnexal lesion.  No free fluid in the pelvis.  Limited visualization of the lower thorax demonstrates minimal bibasilar subpleural linear atelectasis.  There is minimal linear subsegmental atelectasis within the right lower lobe.  Calcified granuloma within the left lower lobe, the sequela of prior minimus infection.  No pleural effusion.  Normal heart size.  No pericardial effusion.  No acute or aggressive osseous abnormalities.  There is minimal (approximately 6 mm) of anterolisthesis of L4 upon L5.  Moderate to severe multilevel lumbar spine degenerative change, worse at L5 - S1 and to a lesser extent, L1 - L2 and L2 - L3, with disc space height loss, end plate irregularity and sclerosis.  Degenerative change of the pubic symphysis.  IMPRESSION:  1.  Findings compatible with acute, uncomplicated appendicitis. No evidence of perforation or drainable fluid collection.  2.  Scattered colonic diverticulosis without evidence of  diverticulitis. 3.  Minimal (approximate 6 mm) anterolisthesis of L4 upon L5 without associated pars defects.  Presumably degenerative in etiology.  4.  Moderate to severe multilevel lumbar spine DDD, worse at L5 - S1.   Original Report Authenticated By: Tacey Ruiz, MD     Medications / Allergies: per chart  Antibiotics: Anti-infectives   Start     Dose/Rate Route Frequency Ordered Stop   08/22/12 0600  gentamicin (GARAMYCIN) IVPB 100 mg    Comments:  Pharmacy may adjust dosing strength, schedule, rate of infusion, etc as needed to optimize therapy Send with patient on call to the OR.  Anesthesia to complete antibiotic administration <24min prior to incision per [facility].   100 mg 200 mL/hr over 30 Minutes Intravenous On call to O.R. 08/21/12 1354 08/23/12 0559   08/22/12 0600  clindamycin (CLEOCIN) IVPB 600 mg    Comments:  Pharmacy may adjust dosing strength, interval, or rate of medication as needed for optimal therapy for the patient Send with patient on call to  the OR.  Anesthesia to complete antibiotic administration <45min prior to incision per [facility].   600 mg 100 mL/hr over 30 Minutes Intravenous On call to O.R. 08/21/12 1354 08/23/12 0559   08/21/12 1345  cefTRIAXone (ROCEPHIN) 1 g in dextrose 5 % 50 mL IVPB  Status:  Discontinued     1 g 100 mL/hr over 30 Minutes Intravenous  Once 08/21/12 1334 08/21/12 1354   08/21/12 1345  metroNIDAZOLE (FLAGYL) IVPB 500 mg  Status:  Discontinued     500 mg 100 mL/hr over 60 Minutes Intravenous  Once 08/21/12 1334 08/21/12 1354      Assessment  Rutherford Nail  72 y.o. female  Day of Surgery  Procedure(s): APPENDECTOMY LAPAROSCOPIC  Problem List:  Principal Problem:   Acute appendicitis   Acute appendicitis  Plan:  -IV ABX -surgery.  Lap appy:  The anatomy & physiology of the digestive tract was discussed.  The pathophysiology of appendicitis was discussed.  Natural history risks without surgery was discussed.   I  feel the risks of no intervention will lead to serious problems that outweigh the operative risks; therefore, I recommended diagnostic laparoscopy with removal of appendix to remove the pathology.  Laparoscopic & open techniques were discussed.   I noted a good likelihood this will help address the problem.    Risks such as bleeding, infection, abscess, leak, reoperation, possible ostomy, hernia, heart attack, death, and other risks were discussed.  Goals of post-operative recovery were discussed as well.  We will work to minimize complications.  Questions were answered.  The patient expresses understanding & wishes to proceed with surgery.  -VTE prophylaxis- SCDs, etc -mobilize as tolerated to help recovery    Ardeth Sportsman, M.D., F.A.C.S. Gastrointestinal and Minimally Invasive Surgery [facility] [address], [address] [place], [place] [address] [phone] Main / Paging   08/21/2012

## 2012-08-21 NOTE — Anesthesia Postprocedure Evaluation (Signed)
  Anesthesia Post-op Note  Patient: Margaret Guerrero  Procedure(s) Performed: Procedure(s): APPENDECTOMY LAPAROSCOPIC (N/A)  Patient Location: PACU  Anesthesia Type:General  Level of Consciousness: awake, alert  and oriented  Airway and Oxygen Therapy: Patient Spontanous Breathing  Post-op Pain: none  Post-op Assessment: Post-op Vital signs reviewed  Post-op Vital Signs: Reviewed and stable  Complications: No apparent anesthesia complications

## 2012-08-21 NOTE — Transfer of Care (Signed)
Immediate Anesthesia Transfer of Care Note  Patient: Margaret Guerrero  Procedure(s) Performed: Procedure(s): APPENDECTOMY LAPAROSCOPIC (N/A)  Patient Location: PACU  Anesthesia Type:General  Level of Consciousness: awake, alert  and oriented  Airway & Oxygen Therapy: Patient Spontanous Breathing and Patient connected to face mask oxygen  Post-op Assessment: Report given to PACU RN and Post -op Vital signs reviewed and stable  Post vital signs: Reviewed and stable  Complications: No apparent anesthesia complications

## 2012-08-21 NOTE — Op Note (Signed)
3:59 PM  PATIENT:  Margaret Guerrero  72 y.o. female  Patient Care Team: Gaye Alken, MD as PCP - General (Family Medicine) Barrie Folk, MD as Consulting Physician (Gastroenterology)  PRE-OPERATIVE DIAGNOSIS:  acute appendicitis  POST-OPERATIVE DIAGNOSIS:  acute appendicitis with suppuration & probable early perforation  PROCEDURE:  Procedure(s): APPENDECTOMY LAPAROSCOPIC  SURGEON:  Surgeon(s): Ardeth Sportsman, MD  ANESTHESIA:   local and general  EBL:  Total I/O In: 1000 [I.V.:1000] Out: 100 [Urine:75; Blood:25]  Delay start of Pharmacological VTE agent (>24hrs) due to surgical blood loss or risk of bleeding:  no  DRAINS: none   SPECIMEN:  Source of Specimen:  Appendix  DISPOSITION OF SPECIMEN:  PATHOLOGY  COUNTS:  YES  PLAN OF CARE: Admit for overnight observation  PATIENT DISPOSITION:  PACU - hemodynamically stable.   INDICATIONS: Patient with concerning symptoms & work up suspicious for appendicitis.  Surgery was recommended:  The anatomy & physiology of the digestive tract was discussed.  The pathophysiology of appendicitis was discussed.  Natural history risks without surgery was discussed.   I feel the risks of no intervention will lead to serious problems that outweigh the operative risks; therefore, I recommended diagnostic laparoscopy with removal of appendix to remove the pathology.  Laparoscopic & open techniques were discussed.   I noted a good likelihood this will help address the problem.    Risks such as bleeding, infection, abscess, leak, reoperation, possible ostomy, hernia, heart attack, death, and other risks were discussed.  Goals of post-operative recovery were discussed as well.  We will work to minimize complications.  Questions were answered.  The patient expresses understanding & wishes to proceed with surgery.  OR FINDINGS: Appendix thickened with phlegmon.  Some ischemia.  Suspicious for perforation.  No discrete abscess.  No diffuse  peritonitis.  Rest of the abdomen within normal limits/underwhelming  DESCRIPTION:   The patient was identified & brought into the operating room. The patient was positioned supine with arms tucked. SCDs were active during the entire case. The patient underwent general anesthesia without any difficulty.  The abdomen was prepped and draped in a sterile fashion. A Surgical Timeout confirmed our plan.  Informed consent was confirmed.  The patient underwent general anaesthesia without difficulty.  The patient was positioned appropriately.  VTE prevention in place.  The patient's abdomen was clipped, prepped, & draped in a sterile fashion.  Surgical timeout confirmed our plan.  The patient was positioned in reverse Trendelenburg.  Abdominal entry with a 5mm laparoscopic port was gained using optical entry technique in the right upper abdomen.  Entry was clean.  I induced carbon dioxide insufflation.  Camera inspection revealed no injury.  Extra ports were carefully placed under direct laparoscopic visualization.  I mobilized the terminal ileum to proximal ascending colon in a lateral to medial fashion.  I took care to avoid injuring any retroperitoneal structures.  I freed the appendix off its attachments to the ascending colon and cecal mesentery, rather inflamed.  I elevated the appendix. I skeletonized the mesoappendix. I was able to free off the base of the appendix which was still viable.  I stapled the appendix off the cecum using a laparoscopic stapler. I took a healthy cuff of viable cecum. I ligated the mesoappendix and assured hemostasis in the mesentery.  I placed the appendix inside an EndoCatch bag and removed out the 12 mm port in the suprapubic region.  I did copious irrigation x 4L. Hemostasis was good in the  mesoappendix, colon mesentery, and retroperitoneum. Staple line was intact on the cecum with no bleeding. I washed out the pelvis, retrohepatic space and right paracolic gutter. I washed  out the left side as well.  Hemostasis is good. There was no perforation or injury. Because the area cleaned up well after irrigation, I did not place a drain.  I aspirated the carbon dioxide. I removed the ports. I closed the 12 mm fascia site using a 0 Vicryl stitch. I closed skin using 4-0 monocryl stitch.  Patient was extubated and sent to the recovery room.  I am about to discuss the operative findings with the patient's family. I suspect the patient is going used in the hospital at least overnight and will need antibiotics for 3 days.

## 2012-08-21 NOTE — Preoperative (Signed)
Beta Blockers   Reason not to administer Beta Blockers:Not Applicable 

## 2012-08-21 NOTE — Anesthesia Procedure Notes (Signed)
Procedure Name: Intubation Date/Time: 08/21/2012 3:08 PM Performed by: Leroy Libman L Patient Re-evaluated:Patient Re-evaluated prior to inductionOxygen Delivery Method: Circle system utilized Preoxygenation: Pre-oxygenation with 100% oxygen Intubation Type: IV induction, Cricoid Pressure applied and Rapid sequence Laryngoscope Size: Miller and 2 Grade View: Grade I Tube type: Oral Tube size: 7.5 mm Number of attempts: 1 Airway Equipment and Method: Stylet Placement Confirmation: ETT inserted through vocal cords under direct vision,  breath sounds checked- equal and bilateral and positive ETCO2 Secured at: 21 cm Tube secured with: Tape Dental Injury: Teeth and Oropharynx as per pre-operative assessment

## 2012-08-21 NOTE — ED Notes (Addendum)
Patient presents with RLQ abdominal pain.  Patient denies any dysuria, N/V/D, and fevers.  Patient's last BM was yesterday and was WNL.

## 2012-08-21 NOTE — ED Provider Notes (Signed)
Patient reports last night at 9:30 PM she started having diffuse lower abdominal pain all the way across her abdomen. She states however now the pain is in her right lower quadrant. She states movement makes the pain worse. She denies nausea or vomiting but does have loss of appetite.  On exam patient is alert and pleasant. She does have tenderness in the right lower quadrant there is no guarding or rebound.  Medical screening examination/treatment/procedure(s) were conducted as a shared visit with non-physician practitioner(s) and myself.  I personally evaluated the patient during the encounter  Devoria Albe, MD, Franz Dell, MD 08/21/12 [phone]

## 2012-08-21 NOTE — ED Provider Notes (Signed)
History     CSN: [id number]  Arrival date & time 08/21/12  4098   First MD Initiated Contact with Patient 08/21/12 1107      Chief Complaint  Patient presents with  . Abdominal Pain    (Consider location/radiation/quality/duration/timing/severity/associated sxs/prior treatment) HPI Comments: Margaret Guerrero is a 72 y.o. female who presents emergency department complaining of right lower quadrant abdominal pain.  Onset of symptoms began last evening and have progressively worsened since.  Patient states that she woke this morning and pain has become severe.  She denies associated symptoms including fevers, night sweats, chills, nausea, vomiting, diarrhea or dysuria.  No known alleviating factors.  Pain is exacerbated by movements or palpation. Patient denies a history of abdominal surgeries.  Patient reports associated anorexia with last meal last evening.  The history is provided by the patient.    Past Medical History  Diagnosis Date  . Ankle fracture   . Hypertension   . Hypercholesteremia     Past Surgical History  Procedure Laterality Date  . Thyroidectomy  1978  . Abdominal hysterectomy    . Ankle fusion  2006    History reviewed. No pertinent family history.  History  Substance Use Topics  . Smoking status: Never Smoker   . Smokeless tobacco: Not on file  . Alcohol Use: Yes    OB History   Grav Para Term Preterm Abortions TAB SAB Ect Mult Living                  Review of Systems  All other systems reviewed and are negative.    Allergies  Penicillins  Home Medications   Current Outpatient Rx  Name  Route  Sig  Dispense  Refill  . calcium carbonate (OS-CAL - DOSED IN MG OF ELEMENTAL CALCIUM) 1250 MG tablet   Oral   Take 1 tablet by mouth daily.         . cyclobenzaprine (FLEXERIL) 10 MG tablet   Oral   Take 10 mg by mouth 3 (three) times daily as needed for muscle spasms.         Marland Kitchen ibuprofen (ADVIL,MOTRIN) 200 MG tablet   Oral   Take 200 mg  by mouth every 6 (six) hours as needed for pain.         Marland Kitchen levothyroxine (SYNTHROID, LEVOTHROID) 50 MCG tablet   Oral   Take 50 mcg by mouth daily before breakfast.         . lisinopril (PRINIVIL,ZESTRIL) 10 MG tablet   Oral   Take 10 mg by mouth daily.         . Multiple Vitamin (MULTIVITAMIN WITH MINERALS) TABS   Oral   Take 1 tablet by mouth daily.         . vitamin C (ASCORBIC ACID) 250 MG tablet   Oral   Take 250 mg by mouth daily.           BP 134/88  Pulse 80  Temp(Src) 98.3 F (36.8 C) (Oral)  Resp 16  SpO2 98%  Physical Exam  Nursing note and vitals reviewed. Constitutional: She is oriented to person, place, and time. She appears well-developed and well-nourished. No distress.  HENT:  Head: Normocephalic and atraumatic.  Eyes: Conjunctivae and EOM are normal.  Neck: Normal range of motion.  Cardiovascular:  Regular rate rhythm.  Intact distal pulses.  No pitting edema.  Pulmonary/Chest: Effort normal.  Lungs clear auscultation bilaterally  Abdominal:  Significant right lower quadrant tenderness.  Positive Rovsing sign.  No abdominal distention.  Musculoskeletal: Normal range of motion.  Neurological: She is alert and oriented to person, place, and time.  Skin: Skin is warm and dry. No rash noted. She is not diaphoretic.  Psychiatric: She has a normal mood and affect. Her behavior is normal.    ED Course  Procedures (including critical care time)  Labs Reviewed  CBC WITH DIFFERENTIAL - Abnormal; Notable for the following:    WBC 13.2 (*)    Neutrophils Relative 88 (*)    Neutro Abs 11.7 (*)    Lymphocytes Relative 7 (*)    All other components within normal limits  COMPREHENSIVE METABOLIC PANEL - Abnormal; Notable for the following:    Sodium 134 (*)    Glucose, Bld 121 (*)    GFR calc non Af Amer 85 (*)    All other components within normal limits  LIPASE, BLOOD  URINALYSIS, ROUTINE W REFLEX MICROSCOPIC   Ct Abdomen Pelvis W  Contrast  08/21/2012  *RADIOLOGY REPORT*  Clinical Data: Right lower quadrant abdominal pain, elevated white blood cell count  CT ABDOMEN AND PELVIS WITH CONTRAST  Technique:  Multidetector CT imaging of the abdomen and pelvis was performed following the standard protocol during bolus administration of intravenous contrast.  Contrast: 50mL OMNIPAQUE IOHEXOL 300 MG/ML  SOLN, OMNIPAQUE IOHEXOL 300 MG/ML  SOLN  Comparison: None.  Findings:  The appendix is enlarged, measuring approximately 1.4 cm in greatest transverse axial dimension (image 53, series 2) and contains a large appendicolith. These findings are associated with a minimal amount of peri appendiceal stranding.  No definable/drainable fluid collection.  Ingested enteric contrast extends to the level of the distal small bowel.  No evidence of enteric obstruction.  Scattered colonic diverticulosis without evidence of diverticulitis.  No pneumoperitoneum, pneumatosis or portal venous gas.  Normal hepatic contour.  No discrete hepatic lesions.  Normal gallbladder.  No intra or extra panic biliary ductal dilatation. No ascites.  There is symmetric enhancement and excretion of the bilateral kidneys.  Incidental note is made of an exaggerated horizontal line of the right kidney.  There are indeterminate sub centimeter hypoattenuating lesions within the anterior/inferior aspect of the right kidney (image 23 as well as the superior pole left kidney (image 11) which is too small to accurate characterize O favored to represent renal cysts.  No definite renal stones on the post contrast examination.  There is a minimal amount of symmetric likely age related perinephric stranding.  No urinary obstruction.  Normal appearance of the bilateral adrenal glands.  Punctate calcification within the spleen, the sequela of prior granulomatous infection. There is a small fat-containing cleft within the mid aspect of the pancreatic parenchyma (image 25, series 2; coronal  image 33, series five) without discrete nodule. No pancreatic ductal dilatation, pancreatic atrophy or peripancreatic stranding.  Scattered minimal atherosclerotic plaque within normal caliber abdominal aorta.  The major branch vessels of the abdominal aorta appear patent on this nine CT examination.  Incidental note is made of a circumaortic left sided renal vein.  No retroperitoneal, mesenteric, pelvic or inguinal lymphadenopathy.  Pelvic organs are normal for age.  No discrete adnexal lesion.  No free fluid in the pelvis.  Limited visualization of the lower thorax demonstrates minimal bibasilar subpleural linear atelectasis.  There is minimal linear subsegmental atelectasis within the right lower lobe.  Calcified granuloma within the left lower lobe, the sequela of prior minimus infection.  No pleural effusion.  Normal heart size.  No pericardial effusion.  No acute or aggressive osseous abnormalities.  There is minimal (approximately 6 mm) of anterolisthesis of L4 upon L5.  Moderate to severe multilevel lumbar spine degenerative change, worse at L5 - S1 and to a lesser extent, L1 - L2 and L2 - L3, with disc space height loss, end plate irregularity and sclerosis.  Degenerative change of the pubic symphysis.  IMPRESSION:  1.  Findings compatible with acute, uncomplicated appendicitis. No evidence of perforation or drainable fluid collection.  2.  Scattered colonic diverticulosis without evidence of diverticulitis. 3.  Minimal (approximate 6 mm) anterolisthesis of L4 upon L5 without associated pars defects.  Presumably degenerative in etiology.  4.  Moderate to severe multilevel lumbar spine DDD, worse at L5 - S1.   Original Report Authenticated By: Tacey Ruiz, MD      1. Acute appendicitis   2. Hypertension       MDM  Patient is a 72 year old female who presented to emergency department with right lower quadrant abdominal pain positive Rovsing sign on exam and normal vital signs.  Concern for  appendicitis.  Labs and imaging ordered for rule out.  Pain managed well in the emergency department.  Disposition pending results.  Labs and imaging reviewed indicating acute appendicitis on CT with a leukocytosis.  IV antibiotics started, Rocephin and Flagyl.  Consult to general surgery as above.  Patient kept n.p.o. and discussed results as well as treatment plan.  Patient seen with attending who agrees with plan.The patient appears reasonably stabilized for admission considering the current resources, flow, and capabilities available in the ED at this time, and I doubt any other [facility] requiring further screening and/or treatment in the ED prior to admission.         Jaci Carrel, [place] 08/21/12 1349

## 2012-08-21 NOTE — ED Notes (Signed)
Patient transported to CT 

## 2012-08-21 NOTE — ED Provider Notes (Signed)
See prior note   Ward Givens, MD 08/21/12 1352

## 2012-08-21 NOTE — Progress Notes (Signed)
ANTIBIOTIC CONSULT NOTE - INITIAL  Pharmacy Consult for [facility] Indication: acute appendicitis, probable early perf, s/p lap appendectomy  Allergies  Allergen Reactions  . Penicillins Rash    Patient Measurements:     Vital Signs: Temp: 99.3 F (37.4 C) (04/27 1708) Temp src: Oral (04/27 1000) BP: 120/76 mmHg (04/27 1708) Pulse Rate: 87 (04/27 1708) Intake/Output from previous day:   Intake/Output from this shift: Total I/O In: 1150 [I.V.:1150] Out: 100 [Urine:75; Blood:25]  Labs:  Recent Labs  08/21/12 1026  WBC 13.2*  HGB 13.7  PLT 215  CREATININE 0.69   CrCl is unknown because there is no height on file for the current visit. No results found for this basename: VANCOTROUGH, VANCOPEAK, VANCORANDOM, GENTTROUGH, GENTPEAK, GENTRANDOM, TOBRATROUGH, TOBRAPEAK, TOBRARND, AMIKACINPEAK, AMIKACINTROU, AMIKACIN,  in the last 72 hours   Microbiology: No results found for this or any previous visit (from the past 720 hour(s)).  Medical History: Past Medical History  Diagnosis Date  . Ankle fracture   . Hypertension   . Hypercholesteremia   . Hypothyroidism     Medications:  Anti-infectives   Start     Dose/Rate Route Frequency Ordered Stop   08/22/12 0600  gentamicin (GARAMYCIN) IVPB 100 mg  Status:  Discontinued    Comments:  Pharmacy may adjust dosing strength, schedule, rate of infusion, etc as needed to optimize therapy Send with patient on call to the OR.  Anesthesia to complete antibiotic administration <20min prior to incision per [facility].   100 mg 200 mL/hr over 30 Minutes Intravenous On call to O.R. 08/21/12 1354 08/21/12 1423   08/22/12 0600  clindamycin (CLEOCIN) IVPB 600 mg  Status:  Discontinued    Comments:  Pharmacy may adjust dosing strength, interval, or rate of medication as needed for optimal therapy for the patient Send with patient on call to the OR.  Anesthesia to complete antibiotic administration <61min prior to incision per [facility].   600 mg 100 mL/hr over 30 Minutes Intravenous On call to O.R. 08/21/12 1354 08/21/12 1424   08/21/12 2200  clindamycin (CLEOCIN) IVPB 600 mg     600 mg 100 mL/hr over 30 Minutes Intravenous Every 6 hours 08/21/12 1718     08/21/12 1730  gentamicin (GARAMYCIN) 2 mg/kg in dextrose 5 % 50 mL IVPB  Status:  Discontinued     2 mg/kg 100 mL/hr over 30 Minutes Intravenous Every 12 hours 08/21/12 1718 08/21/12 1730   08/21/12 1430  gentamicin (GARAMYCIN) IVPB 100 mg    Comments:  Pharmacy may adjust dosing strength, schedule, rate of infusion, etc as needed to optimize therapy Send with patient on call to the OR.  Anesthesia to complete antibiotic administration <42min prior to incision per [facility].   100 mg 200 mL/hr over 30 Minutes Intravenous On call to O.R. 08/21/12 1423 08/21/12 1510   08/21/12 1430  clindamycin (CLEOCIN) IVPB 600 mg    Comments:  Pharmacy may adjust dosing strength, interval, or rate of medication as needed for optimal therapy for the patient Send with patient on call to the OR.  Anesthesia to complete antibiotic administration <18min prior to incision per [facility].   600 mg 100 mL/hr over 30 Minutes Intravenous On call to O.R. 08/21/12 1424 08/21/12 1530   08/21/12 1345  cefTRIAXone (ROCEPHIN) 1 g in dextrose 5 % 50 mL IVPB  Status:  Discontinued     1 g 100 mL/hr over 30 Minutes Intravenous  Once 08/21/12 1334 08/21/12 1354  08/21/12 1345  metroNIDAZOLE (FLAGYL) IVPB 500 mg  Status:  Discontinued     500 mg 100 mL/hr over 60 Minutes Intravenous  Once 08/21/12 1334 08/21/12 1354     Assessment: 72 yo f s/p lap appy x prob perf appx. Natasha Bence ordered post op, pharmacy may adjust  Scr wnl, 0.69  Stated ht/wt: 65", 150lbs  CrCl ~ 31ml/min  WBC 13.2   Goal of Therapy:  Appropriate dose of Gent   Plan:  Gentamicin 480mg  IV q24h (7mg /kg), extended interval dosing 10 hour gent level to ensure correct dosing interval  Gwen Her PharmD   [phone] 08/21/2012 5:37 PM

## 2012-08-22 ENCOUNTER — Encounter (HOSPITAL_COMMUNITY): Payer: Self-pay | Admitting: Surgery

## 2012-08-22 LAB — CBC
HCT: 34.1 % — ABNORMAL LOW (ref 36.0–46.0)
Hemoglobin: 11.5 g/dL — ABNORMAL LOW (ref 12.0–15.0)
MCH: 28.1 pg (ref 26.0–34.0)
MCHC: 33.7 g/dL (ref 30.0–36.0)
MCV: 83.4 fL (ref 78.0–100.0)
RBC: 4.09 MIL/uL (ref 3.87–5.11)

## 2012-08-22 LAB — BASIC METABOLIC PANEL
BUN: 11 mg/dL (ref 6–23)
CO2: 25 mEq/L (ref 19–32)
Chloride: 98 mEq/L (ref 96–112)
GFR calc non Af Amer: 84 mL/min — ABNORMAL LOW (ref >90)
Glucose, Bld: 115 mg/dL — ABNORMAL HIGH (ref 70–99)
Potassium: 3.7 mEq/L (ref 3.5–5.1)
Sodium: 131 mEq/L — ABNORMAL LOW (ref 135–145)

## 2012-08-22 LAB — GENTAMICIN LEVEL, RANDOM: Gentamicin Rm: 4 ug/mL

## 2012-08-22 MED ORDER — METRONIDAZOLE 500 MG PO TABS: 500.0000 mg | ORAL_TABLET | Freq: Three times a day (TID) | ORAL | Status: DC

## 2012-08-22 MED ORDER — CIPROFLOXACIN HCL 500 MG PO TABS: 500.0000 mg | ORAL_TABLET | Freq: Two times a day (BID) | ORAL | Status: DC

## 2012-08-22 MED ORDER — SODIUM CHLORIDE 0.9 % IJ SOLN: 3.0000 mL | Freq: Two times a day (BID) | INTRAMUSCULAR | Status: DC

## 2012-08-22 MED ORDER — SODIUM CHLORIDE 0.9 % IJ SOLN: 3.0000 mL | INTRAMUSCULAR | Status: DC | PRN

## 2012-08-22 MED ORDER — OXYCODONE HCL 5 MG PO TABS: 5.0000 mg | ORAL_TABLET | ORAL | Status: DC | PRN

## 2012-08-22 NOTE — Discharge Summary (Signed)
Physician Discharge Summary  Patient ID: Margaret Guerrero MRN: [id number] DOB/AGE: June 07, 1940 72 y.o.  Admit date: 08/21/2012 Discharge date: 08/22/2012  Admission Diagnoses:  Discharge Diagnoses:  Principal Problem:   Acute appendicitis   Discharged Condition: good  Hospital Course: Pt presented with acute appendicitis.  Resection performed laparoscopically.  Pt felt better am after surgery.  Ok for d/c.  Consults: None  Significant Diagnostic Studies: labs: CBC, Chem  Treatments: surgery: lap appy  Discharge Exam: Blood pressure 110/70, pulse 84, temperature 98.5 F (36.9 C), temperature source Oral, resp. rate 18, height 5\' 5"  (1.651 m), weight 150 lb (68.04 kg), SpO2 97.00%. General appearance: alert, cooperative and no distress Resp: clear to auscultation bilaterally Cardio: regular rate and rhythm GI: soft, appropriately tender, inc: clean, dry , intact  Disposition:   Discharge Orders   Future Orders Complete By Expires     Discharge patient  As directed         Medication List    TAKE these medications       calcium carbonate 1250 MG tablet  Commonly known as:  OS-CAL - dosed in mg of elemental calcium  Take 1 tablet by mouth daily.     ciprofloxacin 500 MG tablet  Commonly known as:  CIPRO  Take 1 tablet (500 mg total) by mouth 2 (two) times daily.     cyclobenzaprine 10 MG tablet  Commonly known as:  FLEXERIL  Take 10 mg by mouth 3 (three) times daily as needed for muscle spasms.     ibuprofen 200 MG tablet  Commonly known as:  ADVIL,MOTRIN  Take 200 mg by mouth every 6 (six) hours as needed for pain.     levothyroxine 50 MCG tablet  Commonly known as:  SYNTHROID, LEVOTHROID  Take 50 mcg by mouth daily before breakfast.     lisinopril 10 MG tablet  Commonly known as:  PRINIVIL,ZESTRIL  Take 10 mg by mouth daily.     metroNIDAZOLE 500 MG tablet  Commonly known as:  FLAGYL  Take 1 tablet (500 mg total) by mouth 3 (three) times daily.     multivitamin with minerals Tabs  Take 1 tablet by mouth daily.     oxyCODONE 5 MG immediate release tablet  Commonly known as:  Oxy IR/ROXICODONE  Take 1-2 tablets (5-10 mg total) by mouth every 4 (four) hours as needed.     vitamin C 250 MG tablet  Commonly known as:  ASCORBIC ACID  Take 250 mg by mouth daily.           Follow-up Information   Follow up with [facility]. Schedule an appointment as soon as possible for a visit in 3 weeks.   Contact information:   [address]   [place] [address] [phone]       Signed: Vanita Panda 08/22/2012, 9:20 AM

## 2012-08-24 ENCOUNTER — Telehealth (INDEPENDENT_AMBULATORY_CARE_PROVIDER_SITE_OTHER): Payer: Self-pay | Admitting: General Surgery

## 2012-08-24 ENCOUNTER — Emergency Department (HOSPITAL_COMMUNITY)
Admission: EM | Admit: 2012-08-24 | Discharge: 2012-08-24 | Disposition: A | Discharge status: Home / Self Care | Payer: Federal, State, Local not specified - PPO | Attending: Emergency Medicine | Admitting: Emergency Medicine

## 2012-08-24 ENCOUNTER — Emergency Department (HOSPITAL_COMMUNITY): Payer: Federal, State, Local not specified - PPO

## 2012-08-24 ENCOUNTER — Encounter (HOSPITAL_COMMUNITY): Payer: Self-pay | Admitting: *Deleted

## 2012-08-24 DIAGNOSIS — Z79899 Other long term (current) drug therapy: Secondary | ICD-10-CM | POA: Insufficient documentation

## 2012-08-24 DIAGNOSIS — Z8639 Personal history of other endocrine, nutritional and metabolic disease: Secondary | ICD-10-CM | POA: Insufficient documentation

## 2012-08-24 DIAGNOSIS — Y838 Other surgical procedures as the cause of abnormal reaction of the patient, or of later complication, without mention of misadventure at the time of the procedure: Secondary | ICD-10-CM | POA: Insufficient documentation

## 2012-08-24 DIAGNOSIS — Z862 Personal history of diseases of the blood and blood-forming organs and certain disorders involving the immune mechanism: Secondary | ICD-10-CM | POA: Insufficient documentation

## 2012-08-24 DIAGNOSIS — E7800 Pure hypercholesterolemia, unspecified: Secondary | ICD-10-CM | POA: Insufficient documentation

## 2012-08-24 DIAGNOSIS — K929 Disease of digestive system, unspecified: Secondary | ICD-10-CM | POA: Insufficient documentation

## 2012-08-24 DIAGNOSIS — R109 Unspecified abdominal pain: Secondary | ICD-10-CM | POA: Insufficient documentation

## 2012-08-24 DIAGNOSIS — Z880 Allergy status to penicillin: Secondary | ICD-10-CM | POA: Insufficient documentation

## 2012-08-24 DIAGNOSIS — Z8739 Personal history of other diseases of the musculoskeletal system and connective tissue: Secondary | ICD-10-CM | POA: Insufficient documentation

## 2012-08-24 DIAGNOSIS — K567 Ileus, unspecified: Secondary | ICD-10-CM

## 2012-08-24 DIAGNOSIS — I10 Essential (primary) hypertension: Secondary | ICD-10-CM | POA: Insufficient documentation

## 2012-08-24 LAB — COMPREHENSIVE METABOLIC PANEL
Alkaline Phosphatase: 94 U/L (ref 39–117)
BUN: 12 mg/dL (ref 6–23)
Chloride: 95 mEq/L — ABNORMAL LOW (ref 96–112)
GFR calc Af Amer: >90 mL/min (ref >90)
Glucose, Bld: 89 mg/dL (ref 70–99)
Potassium: 4.3 mEq/L (ref 3.5–5.1)
Total Bilirubin: 0.5 mg/dL (ref 0.3–1.2)

## 2012-08-24 LAB — URINALYSIS, ROUTINE W REFLEX MICROSCOPIC
Bilirubin Urine: NEGATIVE
Glucose, UA: NEGATIVE mg/dL
Ketones, ur: NEGATIVE mg/dL
Leukocytes, UA: NEGATIVE
Nitrite: NEGATIVE
Protein, ur: NEGATIVE mg/dL

## 2012-08-24 LAB — CBC WITH DIFFERENTIAL/PLATELET
Hemoglobin: 17.3 g/dL — ABNORMAL HIGH (ref 12.0–15.0)
Lymphocytes Relative: 14 % (ref 12–46)
Lymphs Abs: 0.8 10*3/uL (ref 0.7–4.0)
MCH: 28.2 pg (ref 26.0–34.0)
Monocytes Relative: 5 % (ref 3–12)
Neutro Abs: 4.3 10*3/uL (ref 1.7–7.7)
Neutrophils Relative %: 77 % (ref 43–77)
RBC: 6.14 MIL/uL — ABNORMAL HIGH (ref 3.87–5.11)
WBC: 5.5 10*3/uL (ref 4.0–10.5)

## 2012-08-24 MED ORDER — ONDANSETRON HCL 4 MG/2ML IJ SOLN
4.0000 mg | Freq: Once | INTRAMUSCULAR | Status: AC
  Administered 2012-08-24: 4 mg via INTRAVENOUS
  Filled 2012-08-24: qty 2

## 2012-08-24 MED ORDER — MORPHINE SULFATE 4 MG/ML IJ SOLN
4.0000 mg | Freq: Once | INTRAMUSCULAR | Status: AC
  Administered 2012-08-24: 4 mg via INTRAVENOUS
  Filled 2012-08-24: qty 1

## 2012-08-24 MED ORDER — DICYCLOMINE HCL 20 MG PO TABS: 20.0000 mg | ORAL_TABLET | Freq: Two times a day (BID) | ORAL | Status: DC

## 2012-08-24 MED ORDER — IOHEXOL 300 MG/ML  SOLN
80.0000 mL | Freq: Once | INTRAMUSCULAR | Status: AC | PRN
  Administered 2012-08-24: 80 mL via INTRAVENOUS

## 2012-08-24 MED ORDER — DICYCLOMINE HCL 10 MG/ML IM SOLN
20.0000 mg | Freq: Once | INTRAMUSCULAR | Status: AC
  Administered 2012-08-24: 20 mg via INTRAMUSCULAR
  Filled 2012-08-24: qty 4

## 2012-08-24 MED ORDER — IOHEXOL 300 MG/ML  SOLN
50.0000 mL | Freq: Once | INTRAMUSCULAR | Status: AC | PRN
  Administered 2012-08-24: 50 mL via ORAL

## 2012-08-24 MED ORDER — TRAMADOL HCL 50 MG PO TABS
50.0000 mg | ORAL_TABLET | Freq: Once | ORAL | Status: AC
  Administered 2012-08-24: 50 mg via ORAL
  Filled 2012-08-24: qty 1

## 2012-08-24 MED ORDER — POLYETHYLENE GLYCOL 3350 17 G PO PACK: 17.0000 g | PACK | Freq: Every day | ORAL | Status: DC

## 2012-08-24 MED ORDER — SODIUM CHLORIDE 0.9 % IV BOLUS (SEPSIS)
500.0000 mL | Freq: Once | INTRAVENOUS | Status: AC
  Administered 2012-08-24: 500 mL via INTRAVENOUS

## 2012-08-24 MED ORDER — TRAMADOL HCL 50 MG PO TABS: 50.0000 mg | ORAL_TABLET | Freq: Four times a day (QID) | ORAL | Status: DC | PRN

## 2012-08-24 MED ORDER — ONDANSETRON 4 MG PO TBDP: ORAL_TABLET | ORAL | Status: DC

## 2012-08-24 NOTE — Telephone Encounter (Signed)
Patient called and stated that she started feeling bad today, running a fever, vomiting, and she stated that her abdomen is swelling. I told her to go to the ED that she just had surgery back on 08-21-12 for appendicitis, I told her that she will need to go now. She said that she will drive to the hospital herself and I told her that she may need to get someone to drive her to the hospital, or she may need to call 911. She stated that she will be fine driving to the hospital

## 2012-08-24 NOTE — ED Notes (Signed)
Pt reports having appendix removed on Sunday. Started feeling nauseated today and vomited x's 2 today.

## 2012-08-24 NOTE — ED Provider Notes (Addendum)
History     CSN: [id number]  Arrival date & time 08/24/12  1652   First MD Initiated Contact with Patient 08/24/12 1718      Chief Complaint  Patient presents with  . Emesis  . Abdominal Pain    (Consider location/radiation/quality/duration/timing/severity/associated sxs/prior treatment) HPI Pt had appendectomy 4/27 without post op complication. D/c on Monday and felt well. Began having lower abd pain, worse in RLQ today with nausea, anorexia and vomiting x 2.  Past Medical History  Diagnosis Date  . Ankle fracture   . Hypertension   . Hypercholesteremia   . Hypothyroidism     Past Surgical History  Procedure Laterality Date  . Thyroidectomy  1978  . Ankle fusion  2006  . Laparoscopic tubal ligation    . Laparoscopic appendectomy N/A 08/21/2012    Procedure: APPENDECTOMY LAPAROSCOPIC;  Surgeon: Ardeth Sportsman, MD;  Location: [facility];  Service: General;  Laterality: N/A;  . Appendectomy      History reviewed. No pertinent family history.  History  Substance Use Topics  . Smoking status: Never Smoker   . Smokeless tobacco: Not on file  . Alcohol Use: Yes    OB History   Grav Para Term Preterm Abortions TAB SAB Ect Mult Living                  Review of Systems  Constitutional: Negative for fever and chills.  Gastrointestinal: Positive for nausea, vomiting, abdominal pain and constipation. Negative for diarrhea.  Genitourinary: Negative for dysuria.  Skin: Negative for rash and wound.  Neurological: Negative for dizziness, weakness, light-headedness and numbness.  All other systems reviewed and are negative.    Allergies  Ciprofloxacin and Penicillins  Home Medications   Current Outpatient Rx  Name  Route  Sig  Dispense  Refill  . calcium carbonate (OS-CAL - DOSED IN MG OF ELEMENTAL CALCIUM) 1250 MG tablet   Oral   Take 1 tablet by mouth daily.         . cholecalciferol (VITAMIN D) 1000 UNITS tablet   Oral   Take 1,000 Units by mouth daily.          . cyclobenzaprine (FLEXERIL) 10 MG tablet   Oral   Take 10 mg by mouth 3 (three) times daily as needed for muscle spasms.         Marland Kitchen ibuprofen (ADVIL,MOTRIN) 200 MG tablet   Oral   Take 200 mg by mouth every 6 (six) hours as needed for pain.         Marland Kitchen levothyroxine (SYNTHROID, LEVOTHROID) 50 MCG tablet   Oral   Take 50 mcg by mouth daily before breakfast.         . lisinopril (PRINIVIL,ZESTRIL) 10 MG tablet   Oral   Take 10 mg by mouth daily.         . magnesium 30 MG tablet   Oral   Take 30 mg by mouth daily.         . Multiple Vitamin (MULTIVITAMIN WITH MINERALS) TABS   Oral   Take 1 tablet by mouth daily.         Marland Kitchen oxyCODONE (OXY IR/ROXICODONE) 5 MG immediate release tablet   Oral   Take 1-2 tablets (5-10 mg total) by mouth every 4 (four) hours as needed.   30 tablet   0   . vitamin C (ASCORBIC ACID) 250 MG tablet   Oral   Take 250 mg by mouth daily.         Marland Kitchen  dicyclomine (BENTYL) 20 MG tablet   Oral   Take 1 tablet (20 mg total) by mouth 2 (two) times daily.   20 tablet   0   . ondansetron (ZOFRAN ODT) 4 MG disintegrating tablet      4mg  ODT q4 hours prn nausea/vomit   8 tablet   0   . polyethylene glycol (MIRALAX / GLYCOLAX) packet   Oral   Take 17 g by mouth daily.   14 each   0   . traMADol (ULTRAM) 50 MG tablet   Oral   Take 1 tablet (50 mg total) by mouth every 6 (six) hours as needed for pain.   15 tablet   0     BP 155/76  Pulse 91  Temp(Src) 99.2 F (37.3 C) (Oral)  Resp 19  SpO2 99%  Physical Exam  Nursing note and vitals reviewed. Constitutional: She is oriented to person, place, and time. She appears well-developed and well-nourished. No distress.  HENT:  Head: Normocephalic and atraumatic.  Mouth/Throat: Oropharynx is clear and moist.  Eyes: EOM are normal. Pupils are equal, round, and reactive to light.  Neck: Normal range of motion. Neck supple.  Cardiovascular: Normal rate and regular rhythm.    Pulmonary/Chest: Effort normal and breath sounds normal. No respiratory distress. She has no wheezes. She has no rales.  Abdominal: Soft. Bowel sounds are normal. She exhibits no distension and no mass. There is tenderness (RLQ TTP). There is no rebound and no guarding.  Surgical wounds with no signs of infection  Musculoskeletal: Normal range of motion. She exhibits no edema and no tenderness.  Neurological: She is alert and oriented to person, place, and time.  Skin: Skin is warm and dry. No rash noted. No erythema.  Psychiatric: She has a normal mood and affect. Her behavior is normal.    ED Course  Procedures (including critical care time)  Labs Reviewed  CBC WITH DIFFERENTIAL - Abnormal; Notable for the following:    RBC 6.14 (*)    Hemoglobin 17.3 (*)    HCT 50.3 (*)    All other components within normal limits  COMPREHENSIVE METABOLIC PANEL - Abnormal; Notable for the following:    Sodium 134 (*)    Chloride 95 (*)    GFR calc non Af Amer 84 (*)    All other components within normal limits  URINALYSIS, ROUTINE W REFLEX MICROSCOPIC - Abnormal; Notable for the following:    Hgb urine dipstick SMALL (*)    All other components within normal limits  URINE MICROSCOPIC-ADD ON   Ct Abdomen Pelvis W Contrast  08/24/2012  *RADIOLOGY REPORT*  Clinical Data: Recent appendectomy.  Nausea and vomiting.  CT ABDOMEN AND PELVIS WITH CONTRAST  Technique:  Multidetector CT imaging of the abdomen and pelvis was performed following the standard protocol during bolus administration of intravenous contrast.  Contrast: 50mL OMNIPAQUE IOHEXOL 300 MG/ML  SOLN, 80mL OMNIPAQUE IOHEXOL 300 MG/ML  SOLN  Comparison: 08/21/2012.  Findings: Lung Bases: Old granulomatous disease is present in the chest with calcified left hilar and mediastinal lymph nodes. Coronary artery atherosclerosis is present. If office based assessment of coronary risk factors has not been performed, it is now recommended.  Small  bilateral pleural effusions.  Dependent atelectasis.  No focal consolidation.  Calcified granuloma in the left lower lobe.  Liver:  Normal.  Spleen:  Old granulomatous disease.  Gallbladder:  Distended, likely secondary to fasting state.  Common bile duct:  Normal.  Pancreas:  Normal.  Adrenal glands:  Tiny left adrenal nodule is unchanged compared to prior, statistically likely representing a small adenoma.  Kidneys:  Abnormal right renal axis.  Tiny of a left upper pole renal cyst appears similar to prior.  Normal renal enhancement and delayed excretion of contrast.  Both ureters appear within normal limits.  Stomach:  Decompressed.  Small bowel:  Proximal small bowel appears normal.  There is no evidence of small bowel obstruction.  In the anatomic pelvis, small bowel becomes fluid-filled and distended.  Mild stranding is present in the right lower quadrant, likely postoperative.  Stool distends the terminal ileum and extends into the ileocecal valve. Dense contrast from prior CT is present within the colon, outlining stool at the ileocecal valve.  Colon:   Appendectomy.  Large amount of contrast is present within the colon from prior CT.  There is peripheral gas surrounding stool and high density contrast.  The pattern is atypical and it is probably related to ileus and gaseous distention.  The appearance is likely represent pneumatosis based on the configuration.  There are no inflammatory changes of the distal colon.  The descending colon and sigmoid colon are decompressed.  Pelvic Genitourinary:  Small amount of free fluid.  Urinary bladder normal.  Uterus appears normal.  No inguinal adenopathy.  No abscess.  Bones:  Lumbar spondylosis.  No aggressive osseous lesions.  Vasculature: Minimal atherosclerosis, much less than expected for age.  No acute vascular abnormality.  IMPRESSION: 1.  Appendectomy. No abscess. Tiny amount of free fluid in the anatomic pelvis likely postoperative or reactive.  Mild  inflammatory changes of the right lower quadrant compatible with recent operation. 2.  Fluid distended small bowel distally with gaseous distention of the colon.  The overall findings are most compatible with ileus. Obstruction is unlikely. 3.  Old granulomatous disease. 4.  Small bilateral pleural effusions with dependent atelectasis in the lungs.   Original Report Authenticated By: Andreas Newport, M.D.      1. Postoperative ileus       MDM  Discussed with Dr Luisa Hart. Advised D/c home with miralax and call tomorrow to f/u in office. Pt is well appearing. Ambulating in ED. Mild improvement of symptoms with ultram and bentyl. Return precautions given        Loren Racer, MD 08/25/12 1610  Loren Racer, MD 09/06/12 [phone]

## 2012-08-29 ENCOUNTER — Telehealth (INDEPENDENT_AMBULATORY_CARE_PROVIDER_SITE_OTHER): Payer: Self-pay | Admitting: General Surgery

## 2012-08-29 NOTE — Telephone Encounter (Signed)
Called patient to check on her after she was evaluated in the ER for pain status post appendectomy. She is doing well now. No pain other than soreness in the left lower quadrant when she moves to standing. She has had no nausea or vomiting. She is now moving her bowels regularly. She states she is tired, but did go to a wedding this weekend and thinks she is worn out from this. She will contact us with any problems and keep her follow up appt with Dr Michaell Cowing on 09/08/2012.

## 2012-09-07 ENCOUNTER — Ambulatory Visit (INDEPENDENT_AMBULATORY_CARE_PROVIDER_SITE_OTHER): Payer: Federal, State, Local not specified - PPO | Admitting: Surgery

## 2012-09-07 ENCOUNTER — Encounter (INDEPENDENT_AMBULATORY_CARE_PROVIDER_SITE_OTHER): Payer: Self-pay | Admitting: Surgery

## 2012-09-07 VITALS — BP 130/66 | HR 72 | Resp 16 | Ht 65.5 in | Wt 144.0 lb

## 2012-09-07 DIAGNOSIS — M5430 Sciatica, unspecified side: Secondary | ICD-10-CM

## 2012-09-07 DIAGNOSIS — K3580 Unspecified acute appendicitis: Secondary | ICD-10-CM

## 2012-09-07 NOTE — Patient Instructions (Addendum)
LAPAROSCOPIC SURGERY: POST OP INSTRUCTIONS  1. DIET: Follow a light bland diet the first 24 hours after arrival home, such as soup, liquids, crackers, etc.  Be sure to include lots of fluids daily.  Avoid fast food or heavy meals as your are more likely to get nauseated.  Eat a low fat the next few days after surgery.   2. Take your usually prescribed home medications unless otherwise directed. 3. PAIN CONTROL: a. Pain is best controlled by a usual combination of three different methods TOGETHER: i. Ice/Heat ii. Over the counter pain medication iii. Prescription pain medication b. Most patients will experience some swelling and bruising around the incisions.  Ice packs or heating pads (30-60 minutes up to 6 times a day) will help. Use ice for the first few days to help decrease swelling and bruising, then switch to heat to help relax tight/sore spots and speed recovery.  Some people prefer to use ice alone, heat alone, alternating between ice & heat.  Experiment to what works for you.  Swelling and bruising can take several weeks to resolve.   c. It is helpful to take an over-the-counter pain medication regularly for the first few weeks.  Choose one of the following that works best for you: i. Naproxen (Aleve, etc)  Two 236m tabs twice a day ii. Ibuprofen (Advil, etc) Three 2060mtabs four times a day (every meal & bedtime) iii. Acetaminophen (Tylenol, etc) 500-65025mour times a day (every meal & bedtime) d. A  prescription for pain medication (such as oxycodone, hydrocodone, etc) should be given to you upon discharge.  Take your pain medication as prescribed.  i. If you are having problems/concerns with the prescription medicine (does not control pain, nausea, vomiting, rash, itching, etc), please call us [phone] see if we need to switch you to a different pain medicine that will work better for you and/or control your side effect better. ii. If you need a refill on your pain medication,  please contact your pharmacy.  They will contact our office to request authorization. Prescriptions will not be filled after 5 pm or on week-ends. 4. Avoid getting constipated.  Between the surgery and the pain medications, it is common to experience some constipation.  Increasing fluid intake and taking a fiber supplement (such as Metamucil, Citrucel, FiberCon, MiraLax, etc) 1-2 times a day regularly will usually help prevent this problem from occurring.  A mild laxative (prune juice, Milk of Magnesia, MiraLax, etc) should be taken according to package directions if there are no bowel movements after 48 hours.   5. Watch out for diarrhea.  If you have many loose bowel movements, simplify your diet to bland foods & liquids for a few days.  Stop any stool softeners and decrease your fiber supplement.  Switching to mild anti-diarrheal medications (Kayopectate, Pepto Bismol) can help.  If this worsens or does not improve, please call us.[place]. Wash / shower every day.  You may shower over the dressings as they are waterproof.  Continue to shower over incision(s) after the dressing is off. 7. Remove your waterproof bandages 5 days after surgery.  You may leave the incision open to air.  You may replace a dressing/Band-Aid to cover the incision for comfort if you wish.  8. ACTIVITIES as tolerated:   a. You may resume regular (light) daily activities beginning the next day-such as daily self-care, walking, climbing stairs-gradually increasing activities as tolerated.  If you can walk 30 minutes without difficulty, it  is safe to try more intense activity such as jogging, treadmill, bicycling, low-impact aerobics, swimming, etc. b. Save the most intensive and strenuous activity for last such as sit-ups, heavy lifting, contact sports, etc  Refrain from any heavy lifting or straining until you are off narcotics for pain control.   c. DO NOT PUSH THROUGH PAIN.  Let pain be your guide: If it hurts to do something, don't do  it.  Pain is your body warning you to avoid that activity for another week until the pain goes down. d. You may drive when you are no longer taking prescription pain medication, you can comfortably wear a seatbelt, and you can safely maneuver your car and apply brakes. e. You may have sexual intercourse when it is comfortable.  9. FOLLOW UP in our office a. Please call [facility] at [phone] to set up an appointment to see your surgeon in the office for a follow-up appointment approximately 2-3 weeks after your surgery. b. Make sure that you call for this appointment the day you arrive home to insure a convenient appointment time. 10. IF YOU HAVE DISABILITY OR FAMILY LEAVE FORMS, BRING THEM TO THE OFFICE FOR PROCESSING.  DO NOT GIVE THEM TO YOUR DOCTOR.   WHEN TO CALL US [phone]: 1. Poor pain control 2. Reactions / problems with new medications (rash/itching, nausea, etc)  3. Fever over 101.5 F (38.5 C) 4. Inability to urinate 5. Nausea and/or vomiting 6. Worsening swelling or bruising 7. Continued bleeding from incision. 8. Increased pain, redness, or drainage from the incision   The clinic staff is available to answer your questions during regular business hours (8:30am-5pm).  Please don't hesitate to call and ask to speak to one of our nurses for clinical concerns.   If you have a medical emergency, go to the nearest emergency room or call 911.  A surgeon from [facility] is always on call at the hospitals   [facility] [address], [address], [place], [place]  [address] ? MAIN: [phone] ? TOLL FREE: [phone] ?  FAX [phone] [email or web address]  GETTING TO GOOD BOWEL HEALTH. Irregular bowel habits such as constipation and diarrhea can lead to many problems over time.  Having one soft bowel movement a day is the most important way to prevent further problems.  The anorectal canal is designed to handle stretching  and feces to safely manage our ability to get rid of solid waste (feces, poop, stool) out of our body.  BUT, hard constipated stools can act like ripping concrete bricks and diarrhea can be a burning fire to this very sensitive area of our body, causing inflamed hemorrhoids, anal fissures, increasing risk is perirectal abscesses, abdominal pain/bloating, an making irritable bowel worse.     The goal: ONE SOFT BOWEL MOVEMENT A DAY!  To have soft, regular bowel movements:    Drink at least 8 tall glasses of water a day.     Take plenty of fiber.  Fiber is the undigested part of plant food that passes into the colon, acting s "natures broom" to encourage bowel motility and movement.  Fiber can absorb and hold large amounts of water. This results in a larger, bulkier stool, which is soft and easier to pass. Work gradually over several weeks up to 6 servings a day of fiber (25g a day even more if needed) in the form of: o Vegetables -- Root (potatoes, carrots, turnips), leafy green (lettuce, salad greens, celery,   spinach), or cooked high residue (cabbage, broccoli, etc) o Fruit -- Fresh (unpeeled skin & pulp), Dried (prunes, apricots, cherries, etc ),  or stewed ( applesauce)  o Whole grain breads, pasta, etc (whole wheat)  o Bran cereals    Bulking Agents -- This type of water-retaining fiber generally is easily obtained each day by one of the following:  o Psyllium bran -- The psyllium plant is remarkable because its ground seeds can retain so much water. This product is available as Metamucil, Konsyl, Effersyllium, Per Diem Fiber, or the less expensive generic preparation in drug and health food stores. Although labeled a laxative, it really is not a laxative.  o Methylcellulose -- This is another fiber derived from wood which also retains water. It is available as Citrucel. o Polyethylene Glycol - and "artificial" fiber commonly called Miralax or Glycolax.  It is helpful for people with gassy or bloated  feelings with regular fiber o Flax Seed - a less gassy fiber than psyllium   No reading or other relaxing activity while on the toilet. If bowel movements take longer than 5 minutes, you are too constipated   AVOID CONSTIPATION.  High fiber and water intake usually takes care of this.  Sometimes a laxative is needed to stimulate more frequent bowel movements, but    Laxatives are not a good long-term solution as it can wear the colon out. o Osmotics (Milk of Magnesia, Fleets phosphosoda, Magnesium citrate, MiraLax, GoLytely) are safer than  o Stimulants (Senokot, Castor Oil, Dulcolax, Ex Lax)    o Do not take laxatives for more than 7days in a row.    IF SEVERELY CONSTIPATED, try a Bowel Retraining Program: o Do not use laxatives.  o Eat a diet high in roughage, such as bran cereals and leafy vegetables.  o Drink six (6) ounces of prune or apricot juice each morning.  o Eat two (2) large servings of stewed fruit each day.  o Take one (1) heaping tablespoon of a psyllium-based bulking agent twice a day. Use sugar-free sweetener when possible to avoid excessive calories.  o Eat a normal breakfast.  o Set aside 15 minutes after breakfast to sit on the toilet, but do not strain to have a bowel movement.  o If you do not have a bowel movement by the third day, use an enema and repeat the above steps.    Controlling diarrhea o Switch to liquids and simpler foods for a few days to avoid stressing your intestines further. o Avoid dairy products (especially milk & ice cream) for a short time.  The intestines often can lose the ability to digest lactose when stressed. o Avoid foods that cause gassiness or bloating.  Typical foods include beans and other legumes, cabbage, broccoli, and dairy foods.  Every person has some sensitivity to other foods, so listen to our body and avoid those foods that trigger problems for you. o Adding fiber (Citrucel, Metamucil, psyllium, Miralax) gradually can help thicken  stools by absorbing excess fluid and retrain the intestines to act more normally.  Slowly increase the dose over a few weeks.  Too much fiber too soon can backfire and cause cramping & bloating. o Probiotics (such as active yogurt, Align, etc) may help repopulate the intestines and colon with normal bacteria and calm down a sensitive digestive tract.  Most studies show it to be of mild help, though, and such products can be costly. o Medicines:   Bismuth subsalicylate (ex. Kayopectate, [place]) every  30 minutes for up to 6 doses can help control diarrhea.  Avoid if pregnant.   Loperamide (Immodium) can slow down diarrhea.  Start with two tablets (4mg  total) first and then try one tablet every 6 hours.  Avoid if you are having fevers or severe pain.  If you are not better or start feeling worse, stop all medicines and call your doctor for advice o Call your doctor if you are getting worse or not better.  Sometimes further testing (cultures, endoscopy, X-ray studies, bloodwork, etc) may be needed to help diagnose and treat the cause of the diarrhea.  Managing Pain  Pain after surgery or related to activity is often due to strain/injury to muscle, tendon, nerves and/or incisions.  This pain is usually short-term and will improve in a few months.   Many people find it helpful to do the following things TOGETHER to help speed the process of healing and to get back to regular activity more quickly:  1. Avoid heavy physical activity a.  no lifting greater than 20 pounds b. Do not "push through" the pain.  Listen to your body and avoid positions and maneuvers than reproduce the pain c. Walking is okay as tolerated, but go slowly and stop when getting sore.  d. Remember: If it hurts to do it, then don't do it! 2. Take Anti-inflammatory medication  a. Take with food/snack around the clock for 1-2 weeks i. This helps the muscle and nerve tissues become less irritable and calm down faster b. Choose ONE  of the following over-the-counter medications: i. Naproxen 220mg  tabs (ex. Aleve) 1-2 pills twice a day  ii. Ibuprofen 200mg  tabs (ex. Advil, Motrin) 3-4 pills with every meal and just before bedtime iii. Acetaminophen 500mg  tabs (Tylenol) 1-2 pills with every meal and just before bedtime 3. Use a Heating pad or Ice/Cold Pack a. 4-6 times a day b. May use warm bath/hottub  or showers 4. Try Gentle Massage and/or Stretching  a. at the area of pain many times a day b. stop if you feel pain - do not overdo it  Try these steps together to help you body heal faster and avoid making things get worse.  Doing just one of these things may not be enough.    If you are not getting better after two weeks or are noticing you are getting worse, contact our office for further advice; we may need to re-evaluate you & see what other things we can do to help.  Appendicitis Appendicitis is when the appendix is swollen (inflamed). The inflammation can lead to developing a hole (perforation) and a collection of pus (abscess). CAUSES  There is not always an obvious cause of appendicitis. Sometimes it is caused by an obstruction in the appendix. The obstruction can be caused by:  A small, hard, pea-sized ball of stool (fecalith).  Enlarged lymph glands in the appendix. SYMPTOMS   Pain around your belly button (navel) that moves toward your lower right belly (abdomen). The pain can become more severe and sharp as time passes.  Tenderness in the lower right abdomen. Pain gets worse if you cough or make a sudden movement.  Feeling sick to your stomach (nauseous).  Throwing up (vomiting).  Loss of appetite.  Fever.  Constipation.  Diarrhea.  Generally not feeling well. DIAGNOSIS   Physical exam.  Blood tests.  Urine test.  X-rays or a CT scan may confirm the diagnosis. TREATMENT  Once the diagnosis of appendicitis is made, the most  common treatment is to remove the appendix as soon as  possible. This procedure is called appendectomy. In an open appendectomy, a cut (incision) is made in the lower right abdomen and the appendix is removed. In a laparoscopic appendectomy, usually 3 small incisions are made. Long, thin instruments and a camera tube are used to remove the appendix. Most patients go home in 24 to 48 hours after appendectomy. In some situations, the appendix may have already perforated and an abscess may have formed. The abscess may have a "wall" around it as seen on a CT scan. In this case, a drain may be placed into the abscess to remove fluid, and you may be treated with antibiotic medicines that kill germs. The medicine is given through a tube in your vein (IV). Once the abscess has resolved, it may or may not be necessary to have an appendectomy. You may need to stay in the hospital longer than 48 hours. Document Released: 04/13/2005 Document Revised: 10/13/2011 Document Reviewed: 07/09/2009 [facility] Patient Information 2013 [place], [place].

## 2012-09-07 NOTE — Progress Notes (Signed)
Subjective:     Patient ID: Margaret Guerrero, female   DOB: 07-21-1940, 72 y.o.   MRN: [id number]  HPI  Margaret Guerrero  1940-07-08 [id number]  Patient Care Team: Gaye Alken, MD as PCP - General (Family Medicine) Barrie Folk, MD as Consulting Physician (Gastroenterology)  This patient is a 72 y.o.female who presents today for surgical evaluation   POST-OPERATIVE DIAGNOSIS: acute appendicitis with suppuration & probable early perforation  PROCEDURE: Procedure(s):  APPENDECTOMY LAPAROSCOPIC  SURGEON: Surgeon(s):  Ardeth Sportsman, MD  Diagnosis Appendix, Other than Incidental - TRANSMURAL ACUTE APPENDICITIS AND PERIAPPENDICITIS.  The patient comes in today feeling okay.  Not back to normal.  Some loose bowels but improving.  Appetite not normal but gradually returning.  Energy level daily.  Stool.  Does at least a half hour if not an hour a day.  Sore at the lower incision but getting better.  Not bad enough to take any medicine for it.  She went to a wedding reception one week postop.  Wore her out with worsening soreness.  Feeling better this week.  No nausea or vomiting.  No bleeding.  She is hoping to get back to water aerobics soon.  Her hip sciatica not noticeable perioperatively.  However, she feels the right side is coming back.  She wants to get back to doing physical therapy for that as well  Patient Active Problem List   Diagnosis Date Noted  . Acute appendicitis 08/21/2012  . Hypertension     Past Medical History  Diagnosis Date  . Ankle fracture   . Hypertension   . Hypercholesteremia   . Hypothyroidism     Past Surgical History  Procedure Laterality Date  . Thyroidectomy  1978  . Ankle fusion  2006  . Laparoscopic tubal ligation    . Laparoscopic appendectomy N/A 08/21/2012    Procedure: APPENDECTOMY LAPAROSCOPIC;  Surgeon: Ardeth Sportsman, MD;  Location: [facility];  Service: General;  Laterality: N/A;  . Appendectomy      History   Social History  .  Marital Status: Married    Spouse Name: N/A    Number of Children: N/A  . Years of Education: N/A   Occupational History  . Not on file.   Social History Main Topics  . Smoking status: Never Smoker   . Smokeless tobacco: Never Used  . Alcohol Use: Yes  . Drug Use: No  . Sexually Active: Yes   Other Topics Concern  . Not on file   Social History Narrative  . No narrative on file    Family History  Problem Relation Age of Onset  . Diabetes Mother   . Stroke Mother   . Alzheimer's disease Father   . Cancer Maternal Aunt     bone  . Cancer Paternal Aunt     lung    Current Outpatient Prescriptions  Medication Sig Dispense Refill  . calcium carbonate (OS-CAL - DOSED IN MG OF ELEMENTAL CALCIUM) 1250 MG tablet Take 1 tablet by mouth daily.      . cholecalciferol (VITAMIN D) 1000 UNITS tablet Take 1,000 Units by mouth daily.      . cyclobenzaprine (FLEXERIL) 10 MG tablet Take 10 mg by mouth 3 (three) times daily as needed for muscle spasms.      Marland Kitchen dicyclomine (BENTYL) 20 MG tablet Take 1 tablet (20 mg total) by mouth 2 (two) times daily.  20 tablet  0  . ibuprofen (ADVIL,MOTRIN) 200 MG tablet Take  200 mg by mouth every 6 (six) hours as needed for pain.      Marland Kitchen levothyroxine (SYNTHROID, LEVOTHROID) 50 MCG tablet Take 50 mcg by mouth daily before breakfast.      . lisinopril (PRINIVIL,ZESTRIL) 10 MG tablet Take 10 mg by mouth daily.      . magnesium 30 MG tablet Take 30 mg by mouth daily.      . Multiple Vitamin (MULTIVITAMIN WITH MINERALS) TABS Take 1 tablet by mouth daily.      . ondansetron (ZOFRAN ODT) 4 MG disintegrating tablet 4mg  ODT q4 hours prn nausea/vomit  8 tablet  0  . vitamin C (ASCORBIC ACID) 250 MG tablet Take 250 mg by mouth daily.       No current facility-administered medications for this visit.     Allergies  Allergen Reactions  . Ciprofloxacin Itching  . Penicillins Rash    BP 130/66  Pulse 72  Resp 16  Ht 5' 5.5" (1.664 m)  Wt 144 lb (65.318 kg)   BMI 23.59 kg/m2  Ct Abdomen Pelvis W Contrast  08/24/2012   *RADIOLOGY REPORT*  Clinical Data: Recent appendectomy.  Nausea and vomiting.  CT ABDOMEN AND PELVIS WITH CONTRAST  Technique:  Multidetector CT imaging of the abdomen and pelvis was performed following the standard protocol during bolus administration of intravenous contrast.  Contrast: 50mL OMNIPAQUE IOHEXOL 300 MG/ML  SOLN, 80mL OMNIPAQUE IOHEXOL 300 MG/ML  SOLN  Comparison: 08/21/2012.  Findings: Lung Bases: Old granulomatous disease is present in the chest with calcified left hilar and mediastinal lymph nodes. Coronary artery atherosclerosis is present. If office based assessment of coronary risk factors has not been performed, it is now recommended.  Small bilateral pleural effusions.  Dependent atelectasis.  No focal consolidation.  Calcified granuloma in the left lower lobe.  Liver:  Normal.  Spleen:  Old granulomatous disease.  Gallbladder:  Distended, likely secondary to fasting state.  Common bile duct:  Normal.  Pancreas:  Normal.  Adrenal glands:  Tiny left adrenal nodule is unchanged compared to prior, statistically likely representing a small adenoma.  Kidneys:  Abnormal right renal axis.  Tiny of a left upper pole renal cyst appears similar to prior.  Normal renal enhancement and delayed excretion of contrast.  Both ureters appear within normal limits.  Stomach:  Decompressed.  Small bowel:  Proximal small bowel appears normal.  There is no evidence of small bowel obstruction.  In the anatomic pelvis, small bowel becomes fluid-filled and distended.  Mild stranding is present in the right lower quadrant, likely postoperative.  Stool distends the terminal ileum and extends into the ileocecal valve. Dense contrast from prior CT is present within the colon, outlining stool at the ileocecal valve.  Colon:   Appendectomy.  Large amount of contrast is present within the colon from prior CT.  There is peripheral gas surrounding stool and high  density contrast.  The pattern is atypical and it is probably related to ileus and gaseous distention.  The appearance is likely represent pneumatosis based on the configuration.  There are no inflammatory changes of the distal colon.  The descending colon and sigmoid colon are decompressed.  Pelvic Genitourinary:  Small amount of free fluid.  Urinary bladder normal.  Uterus appears normal.  No inguinal adenopathy.  No abscess.  Bones:  Lumbar spondylosis.  No aggressive osseous lesions.  Vasculature: Minimal atherosclerosis, much less than expected for age.  No acute vascular abnormality.  IMPRESSION: 1.  Appendectomy. No abscess. Tiny amount  of free fluid in the anatomic pelvis likely postoperative or reactive.  Mild inflammatory changes of the right lower quadrant compatible with recent operation. 2.  Fluid distended small bowel distally with gaseous distention of the colon.  The overall findings are most compatible with ileus. Obstruction is unlikely. 3.  Old granulomatous disease. 4.  Small bilateral pleural effusions with dependent atelectasis in the lungs.   Original Report Authenticated By: Andreas Newport, M.D.   Ct Abdomen Pelvis W Contrast  08/21/2012   *RADIOLOGY REPORT*  Clinical Data: Right lower quadrant abdominal pain, elevated white blood cell count  CT ABDOMEN AND PELVIS WITH CONTRAST  Technique:  Multidetector CT imaging of the abdomen and pelvis was performed following the standard protocol during bolus administration of intravenous contrast.  Contrast: 50mL OMNIPAQUE IOHEXOL 300 MG/ML  SOLN, OMNIPAQUE IOHEXOL 300 MG/ML  SOLN  Comparison: None.  Findings:  The appendix is enlarged, measuring approximately 1.4 cm in greatest transverse axial dimension (image 53, series 2) and contains a large appendicolith. These findings are associated with a minimal amount of peri appendiceal stranding.  No definable/drainable fluid collection.  Ingested enteric contrast extends to the level of the  distal small bowel.  No evidence of enteric obstruction.  Scattered colonic diverticulosis without evidence of diverticulitis.  No pneumoperitoneum, pneumatosis or portal venous gas.  Normal hepatic contour.  No discrete hepatic lesions.  Normal gallbladder.  No intra or extra panic biliary ductal dilatation. No ascites.  There is symmetric enhancement and excretion of the bilateral kidneys.  Incidental note is made of an exaggerated horizontal line of the right kidney.  There are indeterminate sub centimeter hypoattenuating lesions within the anterior/inferior aspect of the right kidney (image 23 as well as the superior pole left kidney (image 11) which is too small to accurate characterize O favored to represent renal cysts.  No definite renal stones on the post contrast examination.  There is a minimal amount of symmetric likely age related perinephric stranding.  No urinary obstruction.  Normal appearance of the bilateral adrenal glands.  Punctate calcification within the spleen, the sequela of prior granulomatous infection. There is a small fat-containing cleft within the mid aspect of the pancreatic parenchyma (image 25, series 2; coronal image 33, series five) without discrete nodule. No pancreatic ductal dilatation, pancreatic atrophy or peripancreatic stranding.  Scattered minimal atherosclerotic plaque within normal caliber abdominal aorta.  The major branch vessels of the abdominal aorta appear patent on this nine CT examination.  Incidental note is made of a circumaortic left sided renal vein.  No retroperitoneal, mesenteric, pelvic or inguinal lymphadenopathy.  Pelvic organs are normal for age.  No discrete adnexal lesion.  No free fluid in the pelvis.  Limited visualization of the lower thorax demonstrates minimal bibasilar subpleural linear atelectasis.  There is minimal linear subsegmental atelectasis within the right lower lobe.  Calcified granuloma within the left lower lobe, the sequela of prior  minimus infection.  No pleural effusion.  Normal heart size.  No pericardial effusion.  No acute or aggressive osseous abnormalities.  There is minimal (approximately 6 mm) of anterolisthesis of L4 upon L5.  Moderate to severe multilevel lumbar spine degenerative change, worse at L5 - S1 and to a lesser extent, L1 - L2 and L2 - L3, with disc space height loss, end plate irregularity and sclerosis.  Degenerative change of the pubic symphysis.  IMPRESSION:  1.  Findings compatible with acute, uncomplicated appendicitis. No evidence of perforation or drainable fluid collection.  2.  Scattered colonic diverticulosis without evidence of diverticulitis. 3.  Minimal (approximate 6 mm) anterolisthesis of L4 upon L5 without associated pars defects.  Presumably degenerative in etiology.  4.  Moderate to severe multilevel lumbar spine DDD, worse at L5 - S1.   Original Report Authenticated By: Tacey Ruiz, MD     Review of Systems  Constitutional: Negative for fever, chills and diaphoresis.  HENT: Negative for ear pain, sore throat and trouble swallowing.   Eyes: Negative for photophobia and visual disturbance.  Respiratory: Negative for cough and choking.   Cardiovascular: Negative for chest pain and palpitations.  Gastrointestinal: Positive for abdominal pain and diarrhea. Negative for nausea, vomiting, constipation, abdominal distention, anal bleeding and rectal pain.  Endocrine: Negative for cold intolerance, heat intolerance, polyphagia and polyuria.  Genitourinary: Negative for dysuria, urgency, frequency, vaginal bleeding, vaginal discharge, difficulty urinating and pelvic pain.  Musculoskeletal: Positive for myalgias and arthralgias. Negative for joint swelling and gait problem.  Skin: Negative for color change, pallor and rash.  Allergic/Immunologic: Positive for environmental allergies. Negative for food allergies and immunocompromised state.  Neurological: Negative for dizziness, speech difficulty,  weakness and numbness.  Hematological: Negative for adenopathy.  Psychiatric/Behavioral: Negative for confusion and agitation. The patient is not nervous/anxious.        Objective:   Physical Exam  Constitutional: She is oriented to person, place, and time. She appears well-developed and well-nourished. She is active.  Non-toxic appearance. She does not have a sickly appearance. She does not appear ill. No distress.  HENT:  Head: Normocephalic.  Mouth/Throat: Oropharynx is clear and moist. No oropharyngeal exudate.  Eyes: Conjunctivae and EOM are normal. Pupils are equal, round, and reactive to light. No scleral icterus.  Neck: Normal range of motion. No tracheal deviation present.  Cardiovascular: Normal rate and intact distal pulses.   Pulmonary/Chest: Effort normal. No respiratory distress. She exhibits no tenderness.  Abdominal: Soft. She exhibits no distension. There is no tenderness. Hernia confirmed negative in the right inguinal area and confirmed negative in the left inguinal area.  Incisions clean with normal healing ridges.  Mild soreness at left suprapubic incision (extraction site).  No hernias  Genitourinary: No vaginal discharge found.  Musculoskeletal: Normal range of motion. She exhibits no tenderness.  Lymphadenopathy:       Right: No inguinal adenopathy present.       Left: No inguinal adenopathy present.  Neurological: She is alert and oriented to person, place, and time. No cranial nerve deficit. She exhibits normal muscle tone. Coordination normal.  Skin: Skin is warm and dry. No rash noted. She is not diaphoretic.  Psychiatric: She has a normal mood and affect. Her behavior is normal. Judgment normal. Her mood appears not anxious. Cognition and memory are normal. She does not exhibit a depressed mood.  Alert.  Calm       Assessment:     2-1/2 weeks status post laparoscopic appendectomy for appendicitis.  Recovering.     Plan:     Increase activity as  tolerated to regular activity.  Low impact exercise such as walking an hour a day at least ideal.  Okay to start water aerobics in physical therapy as tolerated.  Gradually increase over a few weeks.  Do not push through pain.  Diet as tolerated.  Low fat high fiber diet ideal.  Bowel regimen with 30 g fiber a day and fiber supplement as needed to avoid problems.  Return to clinic as needed.   I offered to see her  again.  She felt comfortable in calling me if she does not get better.  Instructions discussed.  Followup with primary care physician for other health issues as would normally be done.  Questions answered.  The patient expressed understanding and appreciation

## 2014-02-20 ENCOUNTER — Other Ambulatory Visit: Payer: Self-pay | Admitting: Dermatology

## 2014-03-28 ENCOUNTER — Other Ambulatory Visit: Payer: Self-pay | Admitting: Neurological Surgery

## 2014-05-02 ENCOUNTER — Encounter (HOSPITAL_COMMUNITY)
Admission: RE | Admit: 2014-05-02 | Discharge: 2014-05-02 | Disposition: A | Discharge status: Home / Self Care | Payer: Federal, State, Local not specified - PPO | Source: Ambulatory Visit | Attending: Neurological Surgery | Admitting: Neurological Surgery

## 2014-05-02 ENCOUNTER — Ambulatory Visit (HOSPITAL_COMMUNITY)
Admission: RE | Admit: 2014-05-02 | Discharge: 2014-05-02 | Disposition: A | Discharge status: Home / Self Care | Payer: Federal, State, Local not specified - PPO | Source: Ambulatory Visit | Attending: Neurological Surgery | Admitting: Neurological Surgery

## 2014-05-02 ENCOUNTER — Encounter (HOSPITAL_COMMUNITY): Payer: Self-pay

## 2014-05-02 DIAGNOSIS — M4310 Spondylolisthesis, site unspecified: Secondary | ICD-10-CM | POA: Diagnosis not present

## 2014-05-02 DIAGNOSIS — Z01818 Encounter for other preprocedural examination: Secondary | ICD-10-CM | POA: Insufficient documentation

## 2014-05-02 DIAGNOSIS — E780 Pure hypercholesterolemia: Secondary | ICD-10-CM | POA: Diagnosis not present

## 2014-05-02 DIAGNOSIS — M47894 Other spondylosis, thoracic region: Secondary | ICD-10-CM | POA: Insufficient documentation

## 2014-05-02 DIAGNOSIS — J8410 Pulmonary fibrosis, unspecified: Secondary | ICD-10-CM | POA: Diagnosis not present

## 2014-05-02 DIAGNOSIS — Z859 Personal history of malignant neoplasm, unspecified: Secondary | ICD-10-CM | POA: Insufficient documentation

## 2014-05-02 DIAGNOSIS — I77810 Thoracic aortic ectasia: Secondary | ICD-10-CM | POA: Diagnosis not present

## 2014-05-02 DIAGNOSIS — M1990 Unspecified osteoarthritis, unspecified site: Secondary | ICD-10-CM | POA: Insufficient documentation

## 2014-05-02 HISTORY — DX: Unspecified osteoarthritis, unspecified site: M19.90

## 2014-05-02 HISTORY — DX: Malignant (primary) neoplasm, unspecified: C80.1

## 2014-05-02 HISTORY — DX: Personal history of other diseases of the digestive system: Z87.19

## 2014-05-02 LAB — BASIC METABOLIC PANEL
ANION GAP: 9 (ref 5–15)
BUN: 18 mg/dL (ref 6–23)
CALCIUM: 9.6 mg/dL (ref 8.4–10.5)
CO2: 26 mmol/L (ref 19–32)
Chloride: 103 mEq/L (ref 96–112)
Creatinine, Ser: 0.7 mg/dL (ref 0.50–1.10)
GFR calc non Af Amer: 83 mL/min — ABNORMAL LOW (ref >90)
GLUCOSE: 82 mg/dL (ref 70–99)
Potassium: 4.7 mmol/L (ref 3.5–5.1)
SODIUM: 138 mmol/L (ref 135–145)

## 2014-05-02 LAB — PROTIME-INR
INR: 1.12 (ref 0.00–1.49)
Prothrombin Time: 14.5 seconds (ref 11.6–15.2)

## 2014-05-02 LAB — CBC WITH DIFFERENTIAL/PLATELET
Basophils Absolute: 0.1 10*3/uL (ref 0.0–0.1)
Basophils Relative: 1 % (ref 0–1)
Eosinophils Absolute: 0.2 10*3/uL (ref 0.0–0.7)
Eosinophils Relative: 3 % (ref 0–5)
HEMATOCRIT: 40.7 % (ref 36.0–46.0)
HEMOGLOBIN: 13.2 g/dL (ref 12.0–15.0)
LYMPHS PCT: 21 % (ref 12–46)
Lymphs Abs: 1.2 10*3/uL (ref 0.7–4.0)
MCH: 27.7 pg (ref 26.0–34.0)
MCHC: 32.4 g/dL (ref 30.0–36.0)
MCV: 85.5 fL (ref 78.0–100.0)
Monocytes Absolute: 0.4 10*3/uL (ref 0.1–1.0)
Monocytes Relative: 7 % (ref 3–12)
NEUTROS ABS: 4 10*3/uL (ref 1.7–7.7)
NEUTROS PCT: 68 % (ref 43–77)
Platelets: 234 10*3/uL (ref 150–400)
RBC: 4.76 MIL/uL (ref 3.87–5.11)
RDW: 13.4 % (ref 11.5–15.5)
WBC: 5.8 10*3/uL (ref 4.0–10.5)

## 2014-05-02 LAB — TYPE AND SCREEN
ABO/RH(D): B POS
Antibody Screen: NEGATIVE

## 2014-05-02 LAB — SURGICAL PCR SCREEN
MRSA, PCR: NEGATIVE
STAPHYLOCOCCUS AUREUS: POSITIVE — AB

## 2014-05-02 LAB — ABO/RH: ABO/RH(D): B POS

## 2014-05-02 NOTE — Pre-Procedure Instructions (Signed)
Margaret Guerrero  05/02/2014   Your procedure is scheduled on:  Friday, Jan. 15th   Report to [facility] Admitting at  5:30 AM.   Call this number if you have problems the morning of surgery: [phone]   Remember:   Do not eat food or drink liquids after midnight Thursday.   Take these medicines the morning of surgery with A SIP OF WATER: Levothyroxine, Zofran - if needed.             STOP TAKING any aspirin, anti-inflammatories, herbal medications 4-5 days prior to surgery.   Do not wear jewelry, make-up or nail polish.  Do not wear lotions, powders, or perfumes. You may NOT wear deodorant the day of surgery.  Do not shave underarms & legs 48 hours prior to surgery.    Do not bring valuables to the hospital.  [facility] is not responsible for any belongings or valuables.               Contacts, dentures or bridgework may not be worn into surgery.  Leave suitcase in the car. After surgery it may be brought to your room.  For patients admitted to the hospital, discharge time is determined by your treatment team.                 Name and phone number of your driver:    Special Instructions: "Preparing for Surgery" instruction sheet.   Please read over the following fact sheets that you were given: Pain Booklet, Coughing and Deep Breathing, Blood Transfusion Information, MRSA Information and Surgical Site Infection Prevention

## 2014-05-02 NOTE — Progress Notes (Signed)
Pt stated that back 20 yrs ago, she had some chest discomfort, they ran test and it came out to be a hiatal hernia.  No heart discomfort since then, and doesn't see cardio.   DA

## 2014-05-10 MED ORDER — VANCOMYCIN HCL IN DEXTROSE 1-5 GM/200ML-% IV SOLN
1000.0000 mg | INTRAVENOUS | Status: AC
  Administered 2014-05-11: 1000 mg via INTRAVENOUS
  Filled 2014-05-10: qty 200

## 2014-05-10 MED ORDER — DEXAMETHASONE SODIUM PHOSPHATE 10 MG/ML IJ SOLN
10.0000 mg | INTRAMUSCULAR | Status: AC
Start: 2014-05-11 — End: 2014-05-11
  Administered 2014-05-11: 10 mg via INTRAVENOUS
  Filled 2014-05-10: qty 1

## 2014-05-11 ENCOUNTER — Inpatient Hospital Stay (HOSPITAL_COMMUNITY)
Admission: RE | Admit: 2014-05-11 | Discharge: 2014-05-13 | DRG: 460 | Disposition: A | Discharge status: Home / Self Care | Payer: Federal, State, Local not specified - PPO | Source: Ambulatory Visit | Attending: Neurological Surgery | Admitting: Neurological Surgery

## 2014-05-11 ENCOUNTER — Inpatient Hospital Stay (HOSPITAL_COMMUNITY): Payer: Federal, State, Local not specified - PPO

## 2014-05-11 ENCOUNTER — Encounter (HOSPITAL_COMMUNITY): Payer: Self-pay | Admitting: *Deleted

## 2014-05-11 ENCOUNTER — Inpatient Hospital Stay (HOSPITAL_COMMUNITY): Payer: Federal, State, Local not specified - PPO | Admitting: Anesthesiology

## 2014-05-11 ENCOUNTER — Encounter (HOSPITAL_COMMUNITY)
Admission: RE | Disposition: A | Discharge status: Home / Self Care | Payer: Self-pay | Source: Ambulatory Visit | Attending: Neurological Surgery

## 2014-05-11 DIAGNOSIS — Z981 Arthrodesis status: Secondary | ICD-10-CM | POA: Diagnosis not present

## 2014-05-11 DIAGNOSIS — Z85828 Personal history of other malignant neoplasm of skin: Secondary | ICD-10-CM | POA: Diagnosis not present

## 2014-05-11 DIAGNOSIS — M1990 Unspecified osteoarthritis, unspecified site: Secondary | ICD-10-CM | POA: Diagnosis present

## 2014-05-11 DIAGNOSIS — Z888 Allergy status to other drugs, medicaments and biological substances status: Secondary | ICD-10-CM | POA: Diagnosis not present

## 2014-05-11 DIAGNOSIS — M5136 Other intervertebral disc degeneration, lumbar region: Secondary | ICD-10-CM | POA: Diagnosis present

## 2014-05-11 DIAGNOSIS — M4806 Spinal stenosis, lumbar region: Secondary | ICD-10-CM | POA: Diagnosis not present

## 2014-05-11 DIAGNOSIS — Z880 Allergy status to penicillin: Secondary | ICD-10-CM | POA: Diagnosis not present

## 2014-05-11 DIAGNOSIS — R112 Nausea with vomiting, unspecified: Secondary | ICD-10-CM | POA: Diagnosis not present

## 2014-05-11 DIAGNOSIS — E039 Hypothyroidism, unspecified: Secondary | ICD-10-CM | POA: Diagnosis present

## 2014-05-11 DIAGNOSIS — M479 Spondylosis, unspecified: Secondary | ICD-10-CM

## 2014-05-11 DIAGNOSIS — Z79899 Other long term (current) drug therapy: Secondary | ICD-10-CM

## 2014-05-11 DIAGNOSIS — I10 Essential (primary) hypertension: Secondary | ICD-10-CM | POA: Diagnosis present

## 2014-05-11 DIAGNOSIS — Z881 Allergy status to other antibiotic agents status: Secondary | ICD-10-CM

## 2014-05-11 DIAGNOSIS — E780 Pure hypercholesterolemia: Secondary | ICD-10-CM | POA: Diagnosis present

## 2014-05-11 DIAGNOSIS — M4316 Spondylolisthesis, lumbar region: Secondary | ICD-10-CM | POA: Diagnosis present

## 2014-05-11 HISTORY — PX: MAXIMUM ACCESS (MAS)POSTERIOR LUMBAR INTERBODY FUSION (PLIF) 1 LEVEL: SHX6368

## 2014-05-11 SURGERY — FOR MAXIMUM ACCESS (MAS) POSTERIOR LUMBAR INTERBODY FUSION (PLIF) 1 LEVEL
Anesthesia: General | Site: Back

## 2014-05-11 MED ORDER — POTASSIUM CHLORIDE IN NACL 20-0.9 MEQ/L-% IV SOLN
INTRAVENOUS | Status: DC
  Administered 2014-05-11: 14:00:00 via INTRAVENOUS
  Filled 2014-05-11: qty 1000

## 2014-05-11 MED ORDER — ONDANSETRON HCL 4 MG/2ML IJ SOLN
4.0000 mg | INTRAMUSCULAR | Status: DC | PRN
Start: 2014-05-11 — End: 2014-05-13
  Administered 2014-05-11 – 2014-05-12 (×2): 4 mg via INTRAVENOUS
  Filled 2014-05-11 (×2): qty 2

## 2014-05-11 MED ORDER — SODIUM CHLORIDE 0.9 % IJ SOLN
INTRAMUSCULAR | Status: AC
  Filled 2014-05-11: qty 10

## 2014-05-11 MED ORDER — LIDOCAINE HCL (CARDIAC) 20 MG/ML IV SOLN
INTRAVENOUS | Status: DC | PRN
  Administered 2014-05-11: 60 mg via INTRAVENOUS

## 2014-05-11 MED ORDER — HYDROMORPHONE HCL 1 MG/ML IJ SOLN: 0.2500 mg | INTRAMUSCULAR | Status: DC | PRN

## 2014-05-11 MED ORDER — ACETAMINOPHEN 10 MG/ML IV SOLN
1000.0000 mg | Freq: Four times a day (QID) | INTRAVENOUS | Status: DC
  Administered 2014-05-11: 1000 mg via INTRAVENOUS

## 2014-05-11 MED ORDER — MORPHINE SULFATE 2 MG/ML IJ SOLN
1.0000 mg | INTRAMUSCULAR | Status: DC | PRN
  Administered 2014-05-11: 1 mg via INTRAVENOUS
  Filled 2014-05-11: qty 1

## 2014-05-11 MED ORDER — FENTANYL CITRATE 0.05 MG/ML IJ SOLN
INTRAMUSCULAR | Status: AC
  Filled 2014-05-11: qty 5

## 2014-05-11 MED ORDER — HYDROMORPHONE HCL 1 MG/ML IJ SOLN
INTRAMUSCULAR | Status: AC
  Administered 2014-05-11: 0.5 mg via INTRAVENOUS
  Filled 2014-05-11: qty 1

## 2014-05-11 MED ORDER — PHENYLEPHRINE HCL 10 MG/ML IJ SOLN
10.0000 mg | INTRAVENOUS | Status: DC | PRN
  Administered 2014-05-11: 30 ug/min via INTRAVENOUS

## 2014-05-11 MED ORDER — EPHEDRINE SULFATE 50 MG/ML IJ SOLN
INTRAMUSCULAR | Status: DC | PRN
  Administered 2014-05-11: 10 mg via INTRAVENOUS
  Administered 2014-05-11: 15 mg via INTRAVENOUS
  Administered 2014-05-11: 10 mg via INTRAVENOUS
  Administered 2014-05-11: 15 mg via INTRAVENOUS

## 2014-05-11 MED ORDER — ACETAMINOPHEN 325 MG PO TABS: 650.0000 mg | ORAL_TABLET | ORAL | Status: DC | PRN

## 2014-05-11 MED ORDER — ROCURONIUM BROMIDE 50 MG/5ML IV SOLN
INTRAVENOUS | Status: AC
  Filled 2014-05-11: qty 1

## 2014-05-11 MED ORDER — ALBUMIN HUMAN 5 % IV SOLN
INTRAVENOUS | Status: DC | PRN
  Administered 2014-05-11: 09:00:00 via INTRAVENOUS

## 2014-05-11 MED ORDER — LISINOPRIL 10 MG PO TABS
10.0000 mg | ORAL_TABLET | Freq: Every day | ORAL | Status: DC
  Administered 2014-05-11 – 2014-05-13 (×3): 10 mg via ORAL
  Filled 2014-05-11 (×3): qty 1

## 2014-05-11 MED ORDER — 0.9 % SODIUM CHLORIDE (POUR BTL) OPTIME
TOPICAL | Status: DC | PRN
  Administered 2014-05-11: 1000 mL

## 2014-05-11 MED ORDER — SUCCINYLCHOLINE CHLORIDE 20 MG/ML IJ SOLN
INTRAMUSCULAR | Status: AC
  Filled 2014-05-11: qty 1

## 2014-05-11 MED ORDER — PHENOL 1.4 % MT LIQD: 1.0000 | OROMUCOSAL | Status: DC | PRN

## 2014-05-11 MED ORDER — DEXAMETHASONE 4 MG PO TABS
4.0000 mg | ORAL_TABLET | Freq: Four times a day (QID) | ORAL | Status: DC
  Administered 2014-05-11 – 2014-05-13 (×9): 4 mg via ORAL
  Filled 2014-05-11 (×9): qty 1

## 2014-05-11 MED ORDER — HYDROMORPHONE HCL 1 MG/ML IJ SOLN
0.2500 mg | INTRAMUSCULAR | Status: DC | PRN
  Administered 2014-05-11 (×4): 0.5 mg via INTRAVENOUS

## 2014-05-11 MED ORDER — CYCLOBENZAPRINE HCL 10 MG PO TABS
ORAL_TABLET | ORAL | Status: AC
  Administered 2014-05-11: 10 mg via ORAL
  Filled 2014-05-11: qty 1

## 2014-05-11 MED ORDER — DEXAMETHASONE SODIUM PHOSPHATE 4 MG/ML IJ SOLN: 4.0000 mg | Freq: Four times a day (QID) | INTRAMUSCULAR | Status: DC

## 2014-05-11 MED ORDER — GELATIN ABSORBABLE MT POWD
OROMUCOSAL | Status: DC | PRN
  Administered 2014-05-11: 09:00:00 via TOPICAL

## 2014-05-11 MED ORDER — OXYCODONE HCL 5 MG PO TABS
ORAL_TABLET | ORAL | Status: AC
  Administered 2014-05-11: 5 mg via ORAL
  Filled 2014-05-11: qty 1

## 2014-05-11 MED ORDER — CELECOXIB 200 MG PO CAPS
200.0000 mg | ORAL_CAPSULE | Freq: Two times a day (BID) | ORAL | Status: DC
  Administered 2014-05-11 – 2014-05-13 (×5): 200 mg via ORAL
  Filled 2014-05-11 (×5): qty 1

## 2014-05-11 MED ORDER — CYCLOBENZAPRINE HCL 10 MG PO TABS
10.0000 mg | ORAL_TABLET | Freq: Three times a day (TID) | ORAL | Status: DC | PRN
Start: 2014-05-11 — End: 2014-05-13
  Administered 2014-05-11: 10 mg via ORAL

## 2014-05-11 MED ORDER — LEVOTHYROXINE SODIUM 50 MCG PO TABS
50.0000 ug | ORAL_TABLET | Freq: Every day | ORAL | Status: DC
  Administered 2014-05-12 – 2014-05-13 (×2): 50 ug via ORAL
  Filled 2014-05-11 (×2): qty 1

## 2014-05-11 MED ORDER — HYDROMORPHONE HCL 1 MG/ML IJ SOLN
INTRAMUSCULAR | Status: AC
  Filled 2014-05-11: qty 1

## 2014-05-11 MED ORDER — PHENYLEPHRINE 40 MCG/ML (10ML) SYRINGE FOR IV PUSH (FOR BLOOD PRESSURE SUPPORT)
PREFILLED_SYRINGE | INTRAVENOUS | Status: AC
Start: 2014-05-11 — End: 2014-05-11
  Filled 2014-05-11: qty 10

## 2014-05-11 MED ORDER — ACETAMINOPHEN 650 MG RE SUPP: 650.0000 mg | RECTAL | Status: DC | PRN

## 2014-05-11 MED ORDER — SODIUM CHLORIDE 0.9 % IV SOLN: 250.0000 mL | INTRAVENOUS | Status: DC

## 2014-05-11 MED ORDER — DICYCLOMINE HCL 20 MG PO TABS
20.0000 mg | ORAL_TABLET | Freq: Two times a day (BID) | ORAL | Status: DC
  Administered 2014-05-11: 20 mg via ORAL
  Filled 2014-05-11 (×7): qty 1

## 2014-05-11 MED ORDER — SODIUM CHLORIDE 0.9 % IJ SOLN
3.0000 mL | Freq: Two times a day (BID) | INTRAMUSCULAR | Status: DC
  Administered 2014-05-12: 3 mL via INTRAVENOUS

## 2014-05-11 MED ORDER — BUPIVACAINE HCL (PF) 0.25 % IJ SOLN
INTRAMUSCULAR | Status: DC | PRN
  Administered 2014-05-11: 4 mL

## 2014-05-11 MED ORDER — PROPOFOL 10 MG/ML IV BOLUS
INTRAVENOUS | Status: AC
Start: 2014-05-11 — End: 2014-05-11
  Filled 2014-05-11: qty 20

## 2014-05-11 MED ORDER — OXYCODONE-ACETAMINOPHEN 5-325 MG PO TABS
1.0000 | ORAL_TABLET | ORAL | Status: DC | PRN
  Administered 2014-05-11 – 2014-05-12 (×3): 2 via ORAL
  Filled 2014-05-11 (×3): qty 2

## 2014-05-11 MED ORDER — LACTATED RINGERS IV SOLN
INTRAVENOUS | Status: DC | PRN
  Administered 2014-05-11 (×2): via INTRAVENOUS

## 2014-05-11 MED ORDER — OXYCODONE HCL 5 MG/5ML PO SOLN: 5.0000 mg | Freq: Once | ORAL | Status: AC | PRN

## 2014-05-11 MED ORDER — ACETAMINOPHEN 10 MG/ML IV SOLN
INTRAVENOUS | Status: AC
  Administered 2014-05-11: 1000 mg via INTRAVENOUS
  Filled 2014-05-11: qty 100

## 2014-05-11 MED ORDER — SODIUM CHLORIDE 0.9 % IJ SOLN: 3.0000 mL | INTRAMUSCULAR | Status: DC | PRN

## 2014-05-11 MED ORDER — PROPOFOL 10 MG/ML IV BOLUS
INTRAVENOUS | Status: DC | PRN
  Administered 2014-05-11: 160 mg via INTRAVENOUS

## 2014-05-11 MED ORDER — FENTANYL CITRATE 0.05 MG/ML IJ SOLN
INTRAMUSCULAR | Status: DC | PRN
  Administered 2014-05-11 (×2): 100 ug via INTRAVENOUS
  Administered 2014-05-11: 50 ug via INTRAVENOUS

## 2014-05-11 MED ORDER — EPHEDRINE SULFATE 50 MG/ML IJ SOLN
INTRAMUSCULAR | Status: AC
  Filled 2014-05-11: qty 1

## 2014-05-11 MED ORDER — SODIUM CHLORIDE 0.9 % IR SOLN
Status: DC | PRN
  Administered 2014-05-11: 09:00:00

## 2014-05-11 MED ORDER — MIDAZOLAM HCL 5 MG/5ML IJ SOLN
INTRAMUSCULAR | Status: DC | PRN
  Administered 2014-05-11: 2 mg via INTRAVENOUS

## 2014-05-11 MED ORDER — LIDOCAINE HCL (CARDIAC) 20 MG/ML IV SOLN
INTRAVENOUS | Status: AC
  Filled 2014-05-11: qty 5

## 2014-05-11 MED ORDER — MENTHOL 3 MG MT LOZG: 1.0000 | LOZENGE | OROMUCOSAL | Status: DC | PRN

## 2014-05-11 MED ORDER — SODIUM CHLORIDE 0.9 % IV SOLN
1250.0000 mg | INTRAVENOUS | Status: DC
  Administered 2014-05-12 – 2014-05-13 (×2): 1250 mg via INTRAVENOUS
  Filled 2014-05-11 (×4): qty 1250

## 2014-05-11 MED ORDER — PHENYLEPHRINE HCL 10 MG/ML IJ SOLN
INTRAMUSCULAR | Status: DC | PRN
  Administered 2014-05-11 (×3): 80 ug via INTRAVENOUS

## 2014-05-11 MED ORDER — MIDAZOLAM HCL 2 MG/2ML IJ SOLN
INTRAMUSCULAR | Status: AC
Start: 2014-05-11 — End: 2014-05-11
  Filled 2014-05-11: qty 2

## 2014-05-11 MED ORDER — SUCCINYLCHOLINE CHLORIDE 20 MG/ML IJ SOLN
INTRAMUSCULAR | Status: DC | PRN
  Administered 2014-05-11: 50 mg via INTRAVENOUS

## 2014-05-11 MED ORDER — THROMBIN 20000 UNITS EX SOLR
CUTANEOUS | Status: DC | PRN
  Administered 2014-05-11: 09:00:00 via TOPICAL

## 2014-05-11 MED ORDER — OXYCODONE HCL 5 MG PO TABS
5.0000 mg | ORAL_TABLET | Freq: Once | ORAL | Status: AC | PRN
  Administered 2014-05-11: 5 mg via ORAL

## 2014-05-11 SURGICAL SUPPLY — 69 items
BAG DECANTER FOR FLEXI CONT (MISCELLANEOUS) ×3 IMPLANT
BENZOIN TINCTURE PRP APPL 2/3 (GAUZE/BANDAGES/DRESSINGS) ×3 IMPLANT
BLADE CLIPPER SURG (BLADE) IMPLANT
BONE MATRIX OSTEOCEL PRO SM (Bone Implant) ×3 IMPLANT
BUR MATCHSTICK NEURO 3.0 LAGG (BURR) ×3 IMPLANT
CAGE COROENT MP 8X23 (Cage) ×6 IMPLANT
CANISTER SUCT 3000ML (MISCELLANEOUS) ×3 IMPLANT
CLIP NEUROVISION LG (CLIP) ×3 IMPLANT
CLOSURE WOUND 1/2 X4 (GAUZE/BANDAGES/DRESSINGS) ×1
CONT SPEC 4OZ CLIKSEAL STRL BL (MISCELLANEOUS) ×6 IMPLANT
COVER BACK TABLE 24X17X13 BIG (DRAPES) IMPLANT
COVER BACK TABLE 60X90IN (DRAPES) ×3 IMPLANT
DRAPE C-ARM 42X72 X-RAY (DRAPES) ×3 IMPLANT
DRAPE C-ARMOR (DRAPES) ×3 IMPLANT
DRAPE LAPAROTOMY 100X72X124 (DRAPES) ×3 IMPLANT
DRAPE POUCH INSTRU U-SHP 10X18 (DRAPES) ×3 IMPLANT
DRAPE SURG 17X23 STRL (DRAPES) ×3 IMPLANT
DRSG OPSITE 4X5.5 SM (GAUZE/BANDAGES/DRESSINGS) ×3 IMPLANT
DRSG OPSITE POSTOP 4X6 (GAUZE/BANDAGES/DRESSINGS) ×3 IMPLANT
DRSG TELFA 3X8 NADH (GAUZE/BANDAGES/DRESSINGS) IMPLANT
DURAPREP 26ML APPLICATOR (WOUND CARE) ×3 IMPLANT
ELECT REM PT RETURN 9FT ADLT (ELECTROSURGICAL) ×3
ELECTRODE REM PT RTRN 9FT ADLT (ELECTROSURGICAL) ×1 IMPLANT
EVACUATOR 1/8 PVC DRAIN (DRAIN) ×3 IMPLANT
GAUZE SPONGE 4X4 16PLY XRAY LF (GAUZE/BANDAGES/DRESSINGS) IMPLANT
GLOVE BIO SURGEON STRL SZ8 (GLOVE) ×6 IMPLANT
GLOVE BIOGEL M 8.0 STRL (GLOVE) ×3 IMPLANT
GLOVE BIOGEL PI IND STRL 7.5 (GLOVE) ×1 IMPLANT
GLOVE BIOGEL PI IND STRL 8.5 (GLOVE) ×2 IMPLANT
GLOVE BIOGEL PI INDICATOR 7.5 (GLOVE) ×2
GLOVE BIOGEL PI INDICATOR 8.5 (GLOVE) ×4
GLOVE SURG SS PI 7.0 STRL IVOR (GLOVE) ×9 IMPLANT
GOWN STRL REUS W/ TWL LRG LVL3 (GOWN DISPOSABLE) ×2 IMPLANT
GOWN STRL REUS W/ TWL XL LVL3 (GOWN DISPOSABLE) ×2 IMPLANT
GOWN STRL REUS W/TWL 2XL LVL3 (GOWN DISPOSABLE) ×6 IMPLANT
GOWN STRL REUS W/TWL LRG LVL3 (GOWN DISPOSABLE) ×4
GOWN STRL REUS W/TWL XL LVL3 (GOWN DISPOSABLE) ×4
HEMOSTAT POWDER KIT SURGIFOAM (HEMOSTASIS) ×3 IMPLANT
KIT BASIN OR (CUSTOM PROCEDURE TRAY) ×3 IMPLANT
KIT NEEDLE NVM5 EMG ELECT (KITS) ×1 IMPLANT
KIT NEEDLE NVM5 EMG ELECTRODE (KITS) ×2
KIT ROOM TURNOVER OR (KITS) ×3 IMPLANT
MILL MEDIUM DISP (BLADE) ×3 IMPLANT
NEEDLE HYPO 25X1 1.5 SAFETY (NEEDLE) ×3 IMPLANT
NS IRRIG 1000ML POUR BTL (IV SOLUTION) ×3 IMPLANT
PACK LAMINECTOMY NEURO (CUSTOM PROCEDURE TRAY) ×3 IMPLANT
PACK VITOSS BIOACTIVE 10CC (Neuro Prosthesis/Implant) ×3 IMPLANT
PAD ARMBOARD 7.5X6 YLW CONV (MISCELLANEOUS) ×9 IMPLANT
ROD 5.5X40MM (Rod) ×3 IMPLANT
ROD PREBENT 45MM LUMBAR (Rod) ×3 IMPLANT
SCREW LOCK (Screw) ×8 IMPLANT
SCREW LOCK FXNS SPNE MAS PL (Screw) ×4 IMPLANT
SCREW MAS PLIF 5.5X30 (Screw) ×4 IMPLANT
SCREW PAS PLIF 5X30 (Screw) ×2 IMPLANT
SCREW SHANK 5.0X35 (Screw) ×6 IMPLANT
SCREW TULIP 5.5 (Screw) ×6 IMPLANT
SPONGE LAP 4X18 X RAY DECT (DISPOSABLE) IMPLANT
SPONGE SURGIFOAM ABS GEL 100 (HEMOSTASIS) ×3 IMPLANT
STRIP CLOSURE SKIN 1/2X4 (GAUZE/BANDAGES/DRESSINGS) ×2 IMPLANT
SUT VIC AB 0 CT1 18XCR BRD8 (SUTURE) ×2 IMPLANT
SUT VIC AB 0 CT1 8-18 (SUTURE) ×4
SUT VIC AB 2-0 CP2 18 (SUTURE) ×6 IMPLANT
SUT VIC AB 3-0 SH 8-18 (SUTURE) ×6 IMPLANT
SYR 20ML ECCENTRIC (SYRINGE) ×3 IMPLANT
SYR 3ML LL SCALE MARK (SYRINGE) IMPLANT
TOWEL OR 17X24 6PK STRL BLUE (TOWEL DISPOSABLE) ×3 IMPLANT
TOWEL OR 17X26 10 PK STRL BLUE (TOWEL DISPOSABLE) ×3 IMPLANT
TRAY FOLEY CATH 14FRSI W/METER (CATHETERS) ×3 IMPLANT
WATER STERILE IRR 1000ML POUR (IV SOLUTION) ×3 IMPLANT

## 2014-05-11 NOTE — H&P (Signed)
Subjective: Patient is a 74 y.o. female admitted for PLIF. Onset of symptoms was many months ago, progressively worse since that time.  The pain is rated severe and is located at the low back and radiates to legs. The pain is described as aching and occurs constantly. The symptoms have been progressive. Symptoms are exacerbated by activity. MRI or CT showed spondylolisthesis/ stenosis.  Past Medical History  Diagnosis Date  . Ankle fracture   . Hypertension   . Hypercholesteremia   . Hypothyroidism   . Acute appendicitis s/p lap appy 08/21/2012 08/21/2012    Diagnosis Appendix, Other than Incidental - TRANSMURAL ACUTE APPENDICITIS AND PERIAPPENDICITIS.   Marland Kitchen History of hiatal hernia   . Arthritis   . Cancer     skin cancers removed    Past Surgical History  Procedure Laterality Date  . Thyroidectomy  1978  . Ankle fusion  2006  . Laparoscopic tubal ligation    . Laparoscopic appendectomy N/A 08/21/2012    Procedure: APPENDECTOMY LAPAROSCOPIC;  Surgeon: Adin Hector, MD;  Location: [facility];  Service: General;  Laterality: N/A;  . Appendectomy    . Tubal ligation    . Tonsillectomy    . Carpal tunnel release      right wrist  . Ganglion cyst excision      on the left  1964  . Skin cancers      removed    Prior to Admission medications   Medication Sig Start Date End Date Taking? Authorizing Provider  calcium carbonate (OS-CAL - DOSED IN MG OF ELEMENTAL CALCIUM) 1250 MG tablet Take 1 tablet by mouth daily.   Yes Historical Provider, MD  cholecalciferol (VITAMIN D) 1000 UNITS tablet Take 1,000 Units by mouth daily.   Yes Historical Provider, MD  cyclobenzaprine (FLEXERIL) 10 MG tablet Take 10 mg by mouth 3 (three) times daily as needed for muscle spasms.   Yes Historical Provider, MD  glucosamine-chondroitin 500-400 MG tablet Take 1 tablet by mouth daily.   Yes Historical Provider, MD  levothyroxine (SYNTHROID, LEVOTHROID) 50 MCG tablet Take 50 mcg by mouth daily before breakfast.    Yes Historical Provider, MD  lisinopril (PRINIVIL,ZESTRIL) 10 MG tablet Take 10 mg by mouth daily.   Yes Historical Provider, MD  magnesium 30 MG tablet Take 30 mg by mouth daily.   Yes Historical Provider, MD  meloxicam (MOBIC) 7.5 MG tablet Take 7.5 mg by mouth daily.   Yes Historical Provider, MD  Multiple Vitamin (MULTIVITAMIN WITH MINERALS) TABS Take 1 tablet by mouth daily.   Yes Historical Provider, MD  Omega-3 Fatty Acids (FISH OIL PO) Take 1 capsule by mouth 2 (two) times daily.   Yes Historical Provider, MD  Probiotic Product (PROBIOTIC DAILY PO) Take by mouth.   Yes Historical Provider, MD  vitamin C (ASCORBIC ACID) 250 MG tablet Take 250 mg by mouth daily.   Yes Historical Provider, MD  dicyclomine (BENTYL) 20 MG tablet Take 1 tablet (20 mg total) by mouth 2 (two) times daily. Patient not taking: Reported on 05/02/2014 08/24/12   Julianne Rice, MD  ondansetron [facility] ODT) 4 MG disintegrating tablet 4mg  ODT q4 hours prn nausea/vomit Patient not taking: Reported on 05/02/2014 08/24/12   Julianne Rice, MD   Allergies  Allergen Reactions  . Ciprofloxacin Itching  . Isoflurane     Her father had alzheimers and she's read that this drug will promote this disease process  . Penicillins Rash    History  Substance Use Topics  .  Smoking status: Never Smoker   . Smokeless tobacco: Never Used  . Alcohol Use: Yes     Comment: occasionally    Family History  Problem Relation Age of Onset  . Diabetes Mother   . Stroke Mother   . Alzheimer's disease Father   . Cancer Maternal Aunt     bone  . Cancer Paternal Aunt     lung  . 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  Appendectomy as a teenager.  No metastatic disease     Review of Systems  Positive ROS: neg  All other systems have been reviewed and were otherwise negative with the exception of those mentioned in the HPI and as above.  Objective: Vital signs in last 24 hours: Temp:  [98.7 F (37.1  C)] 98.7 F (37.1 C) (01/15 0604) Pulse Rate:  [72] 72 (01/15 0604) Resp:  [20] 20 (01/15 0604) BP: (149)/(72) 149/72 mmHg (01/15 0604) SpO2:  [99 %] 99 % (01/15 0604) Weight:  [149 lb (67.586 kg)] 149 lb (67.586 kg) (01/15 0604)  General Appearance: Alert, cooperative, no distress, appears stated age Head: Normocephalic, without obvious abnormality, atraumatic Eyes: PERRL, conjunctiva/corneas clear, EOM's intact    Neck: Supple, symmetrical, trachea midline Back: Symmetric, no curvature, ROM normal, no CVA tenderness Lungs:  respirations unlabored Heart: Regular rate and rhythm Abdomen: Soft, non-tender Extremities: Extremities normal, atraumatic, no cyanosis or edema Pulses: 2+ and symmetric all extremities Skin: Skin color, texture, turgor normal, no rashes or lesions  NEUROLOGIC:   Mental status: Alert and oriented x4,  no aphasia, good attention span, fund of knowledge, and memory Motor Exam - grossly normal Sensory Exam - grossly normal Reflexes: 1+ Coordination - grossly normal Gait - grossly normal Balance - grossly normal Cranial Nerves: I: smell Not tested  II: visual acuity  OS: nl    OD: nl  II: visual fields Full to confrontation  II: pupils Equal, round, reactive to light  III,VII: ptosis None  III,IV,VI: extraocular muscles  Full ROM  V: mastication Normal  V: facial light touch sensation  Normal  V,VII: corneal reflex  Present  VII: facial muscle function - upper  Normal  VII: facial muscle function - lower Normal  VIII: hearing Not tested  IX: soft palate elevation  Normal  IX,X: gag reflex Present  XI: trapezius strength  5/5  XI: sternocleidomastoid strength 5/5  XI: neck flexion strength  5/5  XII: tongue strength  Normal    Data Review Lab Results  Component Value Date   WBC 5.8 05/02/2014   HGB 13.2 05/02/2014   HCT 40.7 05/02/2014   MCV 85.5 05/02/2014   PLT 234 05/02/2014   Lab Results  Component Value Date   NA 138 05/02/2014   K  4.7 05/02/2014   CL 103 05/02/2014   CO2 26 05/02/2014   BUN 18 05/02/2014   CREATININE 0.70 05/02/2014   GLUCOSE 82 05/02/2014   Lab Results  Component Value Date   INR 1.12 05/02/2014    Assessment/Plan: Patient admitted for laminectomy L3-4, L4-5 with PLIF L4-5. Patient has failed a reasonable attempt at conservative therapy.  I explained the condition and procedure to the patient and answered any questions.  Patient wishes to proceed with procedure as planned. Understands risks/ benefits and typical outcomes of procedure.   Jaley Yan S 05/11/2014 6:27 AM

## 2014-05-11 NOTE — Progress Notes (Signed)
ANTIBIOTIC CONSULT NOTE - INITIAL  Pharmacy Consult for vancomycin Indication: surgical prophylaxis  Allergies  Allergen Reactions  . Ciprofloxacin Itching  . Isoflurane     Her father had alzheimers and she's read that this drug will promote this disease process  . Penicillins Rash    Patient Measurements: Weight: 149 lb (67.586 kg) Adjusted Body Weight:   Vital Signs: Temp: 97.8 F (36.6 C) (01/15 1255) Temp Source: Oral (01/15 0604) BP: 134/57 mmHg (01/15 1255) Pulse Rate: 69 (01/15 1255) Intake/Output from previous day:   Intake/Output from this shift: Total I/O In: 2050 [I.V.:1800; IV Piggyback:250] Out: 605 [Urine:305; Drains:100; Blood:200]  Labs: No results for input(s): WBC, HGB, PLT, LABCREA, CREATININE in the last 72 hours. Estimated Creatinine Clearance: 57.8 mL/min (by C-G formula based on Cr of 0.7). No results for input(s): VANCOTROUGH, VANCOPEAK, VANCORANDOM, GENTTROUGH, GENTPEAK, GENTRANDOM, TOBRATROUGH, TOBRAPEAK, TOBRARND, AMIKACINPEAK, AMIKACINTROU, AMIKACIN in the last 72 hours.   Microbiology: Recent Results (from the past 720 hour(s))  Surgical pcr screen     Status: Abnormal   Collection Time: 05/02/14 10:50 AM  Result Value Ref Range Status   MRSA, PCR NEGATIVE NEGATIVE Final   Staphylococcus aureus POSITIVE (A) NEGATIVE Final    Comment:        The Xpert SA Assay (FDA approved for NASAL specimens in patients over 75 years of age), is one component of a comprehensive surveillance program.  Test performance has been validated by [employer] for patients greater than or equal to 75 year old. It is not intended to diagnose infection nor to guide or monitor treatment.     Medical History: Past Medical History  Diagnosis Date  . Ankle fracture   . Hypertension   . Hypercholesteremia   . Hypothyroidism   . Acute appendicitis s/p lap appy 08/21/2012 08/21/2012    Diagnosis Appendix, Other than Incidental - TRANSMURAL ACUTE APPENDICITIS  AND PERIAPPENDICITIS.   Marland Kitchen History of hiatal hernia   . Arthritis   . Cancer     skin cancers removed    Medications:  Anti-infectives    Start     Dose/Rate Route Frequency Ordered Stop   05/12/14 0730  vancomycin (VANCOCIN) 1,250 mg in sodium chloride 0.9 % 250 mL IVPB     1,250 mg166.7 mL/hr over 90 Minutes Intravenous Every 24 hours 05/11/14 1330     05/11/14 0849  bacitracin 50,000 Units in sodium chloride irrigation 0.9 % 500 mL irrigation  Status:  Discontinued       As needed 05/11/14 0851 05/11/14 1104   05/11/14 0600  vancomycin (VANCOCIN) IVPB 1000 mg/200 mL premix     1,000 mg200 mL/hr over 60 Minutes Intravenous On call to O.R. 05/10/14 1334 05/11/14 0837     Assessment: 74 yof admitted for laminectomy. To continue vancomycin post-op for surgical prophylaxis with hemovac drain in place. Pt is afebrile and pre-op WBC was WNL. Pre-op SCr was also WNL.   Vanc 1/15>>  Goal of Therapy:  Vancomycin trough level 10-15 mcg/ml  Plan:  1. Vancomycin 1250mg  IV Q24H 2. F/u renal fxn, C&S, clinical status and trough at SS 3. BMET in AM  Dasha Kawabata, Rande Lawman 05/11/2014,1:31 PM

## 2014-05-11 NOTE — Anesthesia Postprocedure Evaluation (Signed)
  Anesthesia Post-op Note  Patient: Margaret Guerrero  Procedure(s) Performed: Procedure(s): Maximum Access Surgery Posterior Lumbar Interbody Fusion Lumbar four-five, Laminectomy Lumbar three Lumbar four (N/A)  Patient Location: PACU  Anesthesia Type:General  Level of Consciousness: awake  Airway and Oxygen Therapy: Patient Spontanous Breathing  Post-op Pain: moderate  Post-op Assessment: Post-op Vital signs reviewed, Patient's Cardiovascular Status Stable, Respiratory Function Stable, Patent Airway, No signs of Nausea or vomiting and Pain level controlled  Post-op Vital Signs: Reviewed and stable  Last Vitals:  Filed Vitals:   05/11/14 1320  BP: 123/56  Pulse: 76  Temp: 36.4 C  Resp: 16    Complications: No apparent anesthesia complications

## 2014-05-11 NOTE — Progress Notes (Signed)
Pt arrived to 4N25 via stretcher. Transferred to bed, A/O, VSS. Hemovac, and dressing intact. Pt c/o pain 4/10. Will continue to monitor. Cori Razor, RN

## 2014-05-11 NOTE — Op Note (Signed)
05/11/2014  11:03 AM  PATIENT:  Margaret Guerrero  74 y.o. female  PRE-OPERATIVE DIAGNOSIS:  Lumbar spinal stenosis L3-4 and L4-5 with grade 1 spondylolisthesis L4-5, back and leg pain  POST-OPERATIVE DIAGNOSIS:  Same  PROCEDURE:   1. Decompressive lumbar laminectomy L3-4, L4-5 requiring more work than would be required for a simple exposure of the disk for PLIF in order to adequately decompress the neural elements and address the spinal stenosis, note that the decompression at L3-4 is a level separate from the PLIF level 2. Posterior lumbar interbody fusion L4-5 using PEEK interbody cages packed with morcellized allograft and autograft 3. Posterior fixation L4-5 using cortical pedicle screws.  4. Intertransverse arthrodesis L4-5 using morcellized autograft and allograft.  SURGEON:  Sherley Bounds, MD  ASSISTANTS: Dr. Joya Salm  ANESTHESIA:  General  EBL: 200 ml  Total I/O In: 1250 [I.V.:1000; IV Piggyback:250] Out: 405 [Urine:205; Blood:200]  BLOOD ADMINISTERED:none  DRAINS: Hemovac   INDICATION FOR PROCEDURE: This patient presented with a long history of back and bilateral leg pain. This was not responsive to medical management. She had an MRI which showed severe spinal stenosis at L3-4 and L4-5 with spondylolisthesis at L4-5. Compression instrumented fusion to address her segmental instability and spinal stenosis. Her pain was interfering with her quality of life and activities of daily living. Patient understood the risks, benefits, and alternatives and potential outcomes and wished to proceed.  PROCEDURE DETAILS:  The patient was brought to the operating room. After induction of generalized endotracheal anesthesia the patient was rolled into the prone position on chest rolls and all pressure points were padded. The patient's lumbar region was cleaned and then prepped with DuraPrep and draped in the usual sterile fashion. Anesthesia was injected and then a dorsal midline incision was made  and carried down to the lumbosacral fascia. The fascia was opened and the paraspinous musculature was taken down in a subperiosteal fashion to expose L3-4 and L4-5. A self-retaining retractor was placed. Intraoperative fluoroscopy confirmed my level, and I started with placement of the L4 cortical pedicle screws. The pedicle screw entry zones were identified utilizing surface landmarks and  AP and lateral fluoroscopy. I scored the cortex with the high-speed drill and then used the hand drill and EMG monitoring to drill an upward and outward direction into the pedicle. I then tapped line to line, and the tap was also monitored. I then placed a 5-0 x 30 mm cortical pedicle screw into the pedicles of L4 bilaterally. I then turned my attention to the decompression and the spinous process was removed and complete lumbar laminectomies, hemi- facetectomies, and foraminotomies were performed at L3-4 and L4-5. The patient had significant spinal stenosis and this required more work than would be required for a simple exposure of the disc for posterior lumbar interbody fusion. Much more generous decompression was undertaken in order to adequately decompress the neural elements and address the patient's leg pain. The yellow ligament was removed to expose the underlying dura and nerve roots, and generous foraminotomies were performed to adequately decompress the neural elements. Both the exiting and traversing nerve roots were decompressed on both sides until a coronary dilator passed easily along the nerve roots. Once the decompression was complete, I turned my attention to the posterior lower lumbar interbody fusion. The epidural venous vasculature was coagulated and cut sharply. Disc space was incised and the initial discectomy was performed with pituitary rongeurs. The disc space was distracted with sequential distractors to a height of 8 mm.  We then used a series of scrapers and shavers to prepare the endplates for fusion.  The midline was prepared with Epstein curettes. Once the complete discectomy was finished, we packed an appropriate sized peek interbody cage with local autograft and morcellized allograft, gently retracted the nerve root, and tapped the cage into position at L4-5.  The midline between the cages was packed with morselized autograft and allograft. We then turned our attention to the placement of the lower pedicle screws. The pedicle screw entry zones were identified utilizing surface landmarks and fluoroscopy. I drilled into each pedicle utilizing the hand drill and EMG monitoring, and tapped each pedicle with the appropriate tap. We palpated with a ball probe to assure no break in the cortex. We then placed 5-0 x 30 mm cortical pedicle screws into the pedicles bilaterally at L5. We then decorticated the transverse processes and laid a mixture of morcellized autograft and allograft out over these to perform intertransverse arthrodesis at L4-5 left. We then placed lordotic rods into the multiaxial screw heads of the pedicle screws and locked these in position with the locking caps and anti-torque device. We then checked our construct with AP and lateral fluoroscopy. Irrigated with copious amounts of bacitracin-containing saline solution. Placed a medium Hemovac drain through separate stab incision. Inspected the nerve roots once again to assure adequate decompression, lined to the dura with Gelfoam, and closed the muscle and the fascia with 0 Vicryl. Closed the subcutaneous tissues with 2-0 Vicryl and subcuticular tissues with 3-0 Vicryl. The skin was closed with benzoin and Steri-Strips. Dressing was then applied, the patient was awakened from general anesthesia and transported to the recovery room in stable condition. At the end of the procedure all sponge, needle and instrument counts were correct.   PLAN OF CARE: Admit to inpatient   PATIENT DISPOSITION:  PACU - hemodynamically stable.   Delay start of  Pharmacological VTE agent (>24hrs) due to surgical blood loss or risk of bleeding:  yes

## 2014-05-11 NOTE — Transfer of Care (Signed)
Immediate Anesthesia Transfer of Care Note  Patient: TRENITY PHA  Procedure(s) Performed: Procedure(s): Maximum Access Surgery Posterior Lumbar Interbody Fusion Lumbar four-five, Laminectomy Lumbar three Lumbar four (N/A)  Patient Location: PACU  Anesthesia Type:General  Level of Consciousness: awake, alert , oriented and patient cooperative  Airway & Oxygen Therapy: Patient Spontanous Breathing and Patient connected to nasal cannula oxygen  Post-op Assessment: Report given to PACU RN, Post -op Vital signs reviewed and stable and Patient moving all extremities  Post vital signs: Reviewed and stable  Complications: No apparent anesthesia complications

## 2014-05-11 NOTE — Anesthesia Procedure Notes (Signed)
Procedure Name: Intubation Date/Time: 05/11/2014 7:45 AM Performed by: Julian Reil Pre-anesthesia Checklist: Patient identified, Emergency Drugs available, Suction available and Patient being monitored Patient Re-evaluated:Patient Re-evaluated prior to inductionOxygen Delivery Method: Circle system utilized Preoxygenation: Pre-oxygenation with 100% oxygen Intubation Type: IV induction Ventilation: Mask ventilation without difficulty Laryngoscope Size: Mac and 3 Grade View: Grade II Tube type: Oral Tube size: 7.5 mm Number of attempts: 1 Airway Equipment and Method: Stylet Placement Confirmation: ETT inserted through vocal cords under direct vision,  positive ETCO2 and breath sounds checked- equal and bilateral Secured at: 21 cm Tube secured with: Tape Dental Injury: Teeth and Oropharynx as per pre-operative assessment

## 2014-05-11 NOTE — Anesthesia Preprocedure Evaluation (Signed)
Anesthesia Evaluation  Patient identified by MRN, date of birth, ID band Patient awake    Reviewed: Allergy & Precautions, NPO status , Patient's Chart, lab work & pertinent test results  History of Anesthesia Complications Negative for: history of anesthetic complications  Airway Mallampati: II  TM Distance: >3 FB Neck ROM: Full    Dental  (+) Teeth Intact   Pulmonary neg pulmonary ROS,  breath sounds clear to auscultation        Cardiovascular hypertension, Pt. on medications - angina- Past MI and - CHF Rhythm:Regular     Neuro/Psych Low back pain with both leg symptoms  Neuromuscular disease negative psych ROS   GI/Hepatic Neg liver ROS,   Endo/Other  neg diabetesHypothyroidism   Renal/GU negative Renal ROS     Musculoskeletal  (+) Arthritis -,   Abdominal   Peds  Hematology negative hematology ROS (+)   Anesthesia Other Findings   Reproductive/Obstetrics                             Anesthesia Physical Anesthesia Plan  ASA: II  Anesthesia Plan: General   Post-op Pain Management:    Induction: Intravenous  Airway Management Planned: Oral ETT  Additional Equipment: None  Intra-op Plan:   Post-operative Plan: Extubation in OR  Informed Consent: I have reviewed the patients History and Physical, chart, labs and discussed the procedure including the risks, benefits and alternatives for the proposed anesthesia with the patient or authorized representative who has indicated his/her understanding and acceptance.   Dental advisory given  Plan Discussed with: CRNA and Surgeon  Anesthesia Plan Comments:         Anesthesia Quick Evaluation

## 2014-05-12 LAB — BASIC METABOLIC PANEL
Anion gap: 6 (ref 5–15)
BUN: 13 mg/dL (ref 6–23)
CALCIUM: 9.1 mg/dL (ref 8.4–10.5)
CO2: 26 mmol/L (ref 19–32)
CREATININE: 0.72 mg/dL (ref 0.50–1.10)
Chloride: 105 mEq/L (ref 96–112)
GFR calc Af Amer: >90 mL/min (ref >90)
GFR, EST NON AFRICAN AMERICAN: 83 mL/min — AB (ref >90)
GLUCOSE: 130 mg/dL — AB (ref 70–99)
Potassium: 4.2 mmol/L (ref 3.5–5.1)
Sodium: 137 mmol/L (ref 135–145)

## 2014-05-12 MED ORDER — HYDROCODONE-ACETAMINOPHEN 5-325 MG PO TABS
1.0000 | ORAL_TABLET | ORAL | Status: DC | PRN
  Administered 2014-05-12: 1 via ORAL
  Filled 2014-05-12: qty 1

## 2014-05-12 MED ORDER — DOCUSATE SODIUM 100 MG PO CAPS
100.0000 mg | ORAL_CAPSULE | Freq: Two times a day (BID) | ORAL | Status: DC
  Administered 2014-05-12: 100 mg via ORAL
  Filled 2014-05-12: qty 1

## 2014-05-12 NOTE — Evaluation (Signed)
Physical Therapy Evaluation Patient Details Name: Margaret Guerrero MRN: [id number] DOB: 02/21/1941 Today's Date: 05/12/2014   History of Present Illness  Pt admitted for PLIF L3-5  Clinical Impression  Pt pleasant with education for precautions, mobility, function, brace wear and DME use. Pt stood from bed and BSC with cues for pericare and brace. Pt educated with handout and multimodal cueing. Pt will benefit from acute therapy to maximize mobility, activity tolerance and adherence to precautions to return pt to PLOF.     Follow Up Recommendations No PT follow up    Equipment Recommendations  Rolling walker with 5" wheels    Recommendations for Other Services OT consult     Precautions / Restrictions Precautions Precautions: Back Precaution Booklet Issued: Yes (comment) Required Braces or Orthoses: Spinal Brace Spinal Brace: Applied in sitting position;Lumbar corset      Mobility  Bed Mobility Overal bed mobility: Needs Assistance Bed Mobility: Rolling;Sidelying to Sit Rolling: Supervision Sidelying to sit: Supervision       General bed mobility comments: cues for sequence and precautions  Transfers Overall transfer level: Modified independent   Transfers: Sit to/from Stand Sit to Stand: Supervision         General transfer comment: cues for posture and hand placement  Ambulation/Gait Ambulation/Gait assistance: Modified independent (Device/Increase time) Ambulation Distance (Feet): 300 Feet Assistive device: Rolling walker (2 wheeled) Gait Pattern/deviations: Step-through pattern;Decreased stride length   Gait velocity interpretation: Below normal speed for age/gender    Stairs            Wheelchair Mobility    Modified Rankin (Stroke Patients Only)       Balance                                             Pertinent Vitals/Pain Pain Assessment: 0-10 Pain Score: 2  Pain Location: incision Pain Descriptors / Indicators:  Aching Pain Intervention(s): Repositioned;Premedicated before session    Home Living Family/patient expects to be discharged to:: Private residence Living Arrangements: Spouse/significant other Available Help at Discharge: Family;Available 24 hours/day (x 1 wk, significant other works in [place] on weekends) Type of Home: House Home Access: Level entry     Home Layout: One level Home Equipment: Crutches Additional Comments: elevated toilet    Prior Function Level of Independence: Independent               Hand Dominance        Extremity/Trunk Assessment   Upper Extremity Assessment: Overall WFL for tasks assessed           Lower Extremity Assessment: Overall WFL for tasks assessed      Cervical / Trunk Assessment: Normal  Communication   Communication: No difficulties  Cognition Arousal/Alertness: Awake/alert Behavior During Therapy: WFL for tasks assessed/performed Overall Cognitive Status: Within Functional Limits for tasks assessed                      General Comments      Exercises        Assessment/Plan    PT Assessment Patient needs continued PT services  PT Diagnosis Difficulty walking;Acute pain   PT Problem List Decreased activity tolerance;Decreased mobility;Pain  PT Treatment Interventions Gait training;Functional mobility training;Therapeutic activities;Patient/family education   PT Goals (Current goals can be found in the Care Plan section) Acute Rehab PT Goals Patient Stated  Goal: return to normal life sewing, scrapbooking, socializing PT Goal Formulation: With patient Time For Goal Achievement: 05/19/14 Potential to Achieve Goals: Good    Frequency Min 5X/week   Barriers to discharge        Co-evaluation               End of Session Equipment Utilized During Treatment: Back brace Activity Tolerance: Patient tolerated treatment well Patient left: in chair;with call bell/phone within reach;with chair alarm  set Nurse Communication: Mobility status;Precautions         Time: 0830-0900 PT Time Calculation (min) (ACUTE ONLY): 30 min   Charges:   PT Evaluation $Initial PT Evaluation Tier I: 1 Procedure PT Treatments $Gait Training: 8-22 mins $Therapeutic Activity: 8-22 mins   PT G Codes:        Melford Aase 05/12/2014, 9:06 AM Elwyn Reach, [place]

## 2014-05-12 NOTE — Evaluation (Signed)
Occupational Therapy Evaluation Patient Details Name: Margaret Guerrero MRN: [id number] DOB: Apr 12, 1941 Today's Date: 05/12/2014    History of Present Illness Pt admitted for PLIF L3-5   Clinical Impression   PTA pt lived at home and was independent with ADLs. Pt overall at Supervision level with functional mobility and requires assistance for LB ADLs due to back precautions and hx of knee problems. Pt will benefit from acute OT for practice with AE (reacher and sock aid) to increase independence at d/c.     Follow Up Recommendations  No OT follow up;Supervision - Intermittent    Equipment Recommendations  None recommended by OT    Recommendations for Other Services       Precautions / Restrictions Precautions Precautions: Back Precaution Booklet Issued: Yes (comment) Precaution Comments: Educated pt on 3/3 back precautions and incorporating into ADLs.  Required Braces or Orthoses: Spinal Brace Spinal Brace: Applied in sitting position;Lumbar corset Restrictions Weight Bearing Restrictions: No      Mobility Bed Mobility Overal bed mobility: Needs Assistance Bed Mobility: Rolling;Sidelying to Sit Rolling: Supervision Sidelying to sit: Supervision       General bed mobility comments: Supervision for safety.   Transfers Overall transfer level: Modified independent Equipment used: Rolling walker (2 wheeled) Transfers: Sit to/from Stand Sit to Stand: Supervision         General transfer comment: increased time to stand         ADL Overall ADL's : Needs assistance/impaired Eating/Feeding: Independent;Sitting   Grooming: Supervision/safety;Standing   Upper Body Bathing: Set up;Sitting   Lower Body Bathing: Minimal assistance;Sit to/from stand   Upper Body Dressing : Set up;Sitting   Lower Body Dressing: Minimal assistance;Sit to/from stand Lower Body Dressing Details (indicate cue type and reason): (A) for RLE due to inability to cross ("bad knee").  Toilet  Transfer: Supervision/safety;Ambulation;BSC;RW   Toileting- Clothing Manipulation and Hygiene: Supervision/safety;Sit to/from stand   Tub/ Shower Transfer: Walk-in shower;Supervision/safety   Functional mobility during ADLs: Supervision/safety;Rolling walker General ADL Comments: Pt slightly anxious as her husband will only be home with her for 1-2 weeks before returning to their [place] home for work. Pt with questions regarding driving. Discussed incorporating precautions into ADLs including no sitting >45 minutes. Pt has recaher at home and discussed how to use for LB ADLs. Would benefit from practice with sock aid and reacher.         Perception Perception Perception Tested?: No   Praxis Praxis Praxis tested?: Within functional limits    Pertinent Vitals/Pain Pain Assessment: No/denies pain Pain Score: 2  Pain Location: incision Pain Descriptors / Indicators: Aching Pain Intervention(s): Repositioned;Premedicated before session     Hand Dominance Right   Extremity/Trunk Assessment Upper Extremity Assessment Upper Extremity Assessment: Overall WFL for tasks assessed   Lower Extremity Assessment Lower Extremity Assessment: Overall WFL for tasks assessed   Cervical / Trunk Assessment Cervical / Trunk Assessment: Normal   Communication Communication Communication: No difficulties   Cognition Arousal/Alertness: Awake/alert Behavior During Therapy: Anxious;WFL for tasks assessed/performed Overall Cognitive Status: Within Functional Limits for tasks assessed                                Home Living Family/patient expects to be discharged to:: Private residence Living Arrangements: Spouse/significant other Available Help at Discharge:  (x1 week; husband words in [place] on weekends) Type of Home: House (townhouse) Home Access: Level entry     Home Layout:  One level     Bathroom Shower/Tub: Occupational psychologist: Handicapped height      Home Equipment: Crutches;Grab bars - tub/shower;Adaptive equipment Adaptive Equipment: Long-handled shoe horn;Reacher Additional Comments: elevated toilet      Prior Functioning/Environment Level of Independence: Independent             OT Diagnosis: Generalized weakness;Acute pain   OT Problem List: Decreased strength;Decreased range of motion;Decreased activity tolerance;Impaired balance (sitting and/or standing);Decreased knowledge of use of DME or AE;Decreased knowledge of precautions;Pain   OT Treatment/Interventions: Therapeutic activities;Self-care/ADL training;Energy conservation;DME and/or AE instruction;Patient/family education;Balance training    OT Goals(Current goals can be found in the care plan section) Acute Rehab OT Goals Patient Stated Goal: return to normal life sewing, scrapbooking, socializing OT Goal Formulation: With patient Time For Goal Achievement: 05/26/14 Potential to Achieve Goals: Good  OT Frequency: Min 1X/week    End of Session Equipment Utilized During Treatment: Gait belt;Rolling walker;Back brace  Activity Tolerance: Patient tolerated treatment well Patient left: in chair;with call bell/phone within reach;with chair alarm set   Time: 3419-6222 OT Time Calculation (min): 44 min Charges:  OT General Charges $OT Visit: 1 Procedure OT Evaluation $Initial OT Evaluation Tier I: 1 Procedure OT Treatments $Self Care/Home Management : 38-52 mins G-Codes:    Villa Herb M 14-May-2014, 12:07 PM  Cyndie Chime, OTR/L Occupational Therapist [phone] (pager)

## 2014-05-12 NOTE — Progress Notes (Signed)
Filed Vitals:   05/11/14 1822 05/11/14 2138 05/12/14 0214 05/12/14 0547  BP: 136/60 134/68 139/65 124/62  Pulse: 78 84 83 91  Temp: 97.6 F (36.4 C) 98.2 F (36.8 C) 97.9 F (36.6 C) 98.2 F (36.8 C)  TempSrc: Axillary Oral Oral Oral  Resp: 16 18 16 18   Weight:      SpO2: 100% 100% 99% 100%    BMET  Recent Labs  05/12/14 0540  NA 137  K 4.2  CL 105  CO2 26  GLUCOSE 130*  BUN 13  CREATININE 0.72  CALCIUM 9.1    Patient resting comfortably bed, but vomited dinner last night, and had nausea after taking Percocet this morning. Given antiemetic, and nausea is easing.  Has done well with hydrocodone before, Norco ordered. Continuing IV fluids until nausea resolves. We will have patient eat subsequently, will use Norco, and once tolerating pain medication and by mouth diet, we'll saline lock IV. Encouraged to ambulate actively in the halls. Nursing staff to remove wound drain today.  Plan: Continue IVF, order Norco, monitor progress.  Hosie Spangle, MD 05/12/2014, 8:18 AM

## 2014-05-13 NOTE — Progress Notes (Addendum)
Occupational Therapy Treatment Patient Details Name: Margaret Guerrero MRN: [id number] DOB: 09-16-1940 Today's Date: 05/13/2014    History of present illness Pt admitted for PLIF L3-5   OT comments  Pt moving well. Education provided and feel pt is safe to d/c home from OT standpoint.   Follow Up Recommendations  No OT follow up;Supervision - Intermittent    Equipment Recommendations  Other (comment) (AE)    Recommendations for Other Services      Precautions / Restrictions Precautions Precautions: Back Precaution Booklet Issued: No Precaution Comments: pt able to state 3/3 precautions; reviewed Required Braces or Orthoses: Spinal Brace Spinal Brace: Applied in sitting position;Lumbar corset Restrictions Weight Bearing Restrictions: No       Mobility Bed Mobility Overal bed mobility: Needs Assistance Bed Mobility: Rolling;Sidelying to Sit Rolling: Supervision Sidelying to sit: Modified independent (Device/Increase time)       General bed mobility comments: cues for technique   Transfers Overall transfer level: Needs assistance Equipment used: Rolling walker (2 wheeled) Transfers: Sit to/from Stand Sit to Stand: Supervision         General transfer comment: cues for hand placement/technique        ADL Overall ADL's : Needs assistance/impaired     Grooming: Wash/dry hands;Supervision/safety;Standing           Upper Body Dressing : Set up;Supervision/safety;Sitting;Standing   Lower Body Dressing: Minimal assistance;Sit to/from stand;With adaptive equipment   Toilet Transfer: Supervision/safety;Ambulation;RW;Comfort height toilet;Grab bars   Toileting- Clothing Manipulation and Hygiene: Supervision/safety;Sit to/from stand;Cueing for back precautions       Functional mobility during ADLs: Supervision/safety;Rolling walker General ADL Comments: Educated on safety such as safe shoewear, sitting for LB ADLs, use of bag on walker. Educated on use of cup  for oral care and placement of grooming items to avoid breaking precautions. 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 Educated on LB dressing technique. Reviewed/educated on brace. Discussed incorporating precautions into functional activities. Educated on what pt could use for toilet aide.  Recommended someone be with her for shower transfer.  Educated on positioning of pillows. Pt stood and adjusted back brace (slightly off center so OT assisted while pt was at sink, however feel pt could do this)      Vision                     Perception     Praxis      Cognition  Awake/Alert Behavior During Therapy: [facility] for tasks assessed/performed Overall Cognitive Status: Within Functional Limits for tasks assessed                       Extremity/Trunk Assessment               Exercises     Shoulder Instructions       General Comments      Pertinent Vitals/ Pain       Pain Assessment: 0-10 Pain Score:  (1-2) Pain Location: back Pain Intervention(s): Monitored during session  Home Living                                          Prior Functioning/Environment              Frequency Min 1X/week     Progress Toward Goals  OT Goals(current goals can now be found in  the care plan section)  Progress towards OT goals: Progressing toward goals  Acute Rehab OT Goals Patient Stated Goal: not stated OT Goal Formulation: With patient Time For Goal Achievement: 05/26/14 Potential to Achieve Goals: Good ADL Goals Pt Will Perform Grooming: standing;with set-up Pt Will Perform Lower Body Bathing: with set-up;with supervision;with adaptive equipment;sit to/from stand Pt Will Perform Upper Body Dressing: with set-up;sitting Pt Will Perform Lower Body Dressing: with set-up;with supervision;with adaptive equipment;sit to/from stand Pt Will Transfer to Toilet: with modified independence;ambulating  Plan Discharge plan remains  appropriate    Co-evaluation                 End of Session Equipment Utilized During Treatment: Gait belt;Rolling walker;Back brace   Activity Tolerance Patient tolerated treatment well   Patient Left in chair;with call bell/phone within reach;with nursing/sitter in room   Nurse Communication          Time: 2820-8138 OT Time Calculation (min): 33 min  Charges: OT General Charges $OT Visit: 1 Procedure OT Treatments $Self Care/Home Management : 23-37 mins  Benito Mccreedy OTR/L [phone] 05/13/2014, 11:54 AM

## 2014-05-13 NOTE — Progress Notes (Signed)
Pt d/c to home by car with family. Assessment stable. Prescriptions given. 

## 2014-05-13 NOTE — Progress Notes (Signed)
PT Cancellation Note  Patient Details Name: Margaret Guerrero MRN: [id number] DOB: January 31, 1941   Cancelled Treatment:     Pt deferring PT session due to just receiving lunch.  She reports she has been walking around unit by herself & feels comfortable with d/cing home today.     [name], [place] [phone] 05/13/2014

## 2014-05-13 NOTE — Discharge Instructions (Signed)
Spinal Fusion Care After  These instructions give you information on caring for yourself after your procedure. Your doctor may also give you more specific instructions. Call your doctor if you have any problems or questions after your procedure. HOME CARE  Only take medicines as told by your doctor.  Do not drive if you are taking certain pain medicines (narcotics).  Change your bandage (dressing) as told by your doctor.  Do not get the wound wet. Do not take baths or swim until your doctor says it is okay.  Check the wound often for redness, puffiness (swelling), or leaking fluids.  Ask your doctor what activities you should avoid and for how long.  Walk as much as you can.  Do not sit for long periods of time. Change positions every hour.  Do not lift anything heavier than 5 to 10 pounds (2.25 to 4.5 kilograms) until your doctor says it is safe.  Do not twist or bend for 6 weeks. Try not to pull on things. Do not hold things away from your body.  Ask your doctor what exercises you should do. Exercises can help make the back stronger. GET HELP RIGHT AWAY IF:  Your pain suddenly gets worse.  The wound is red, puffy, bleeding, or leaking fluid.  Your legs or feet are painful, puffy, weak, or lose feeling (numb).  You cannot control when you pee (urinate) or poop (bowel movement).  You have trouble breathing.  You have chest pain.  You have a temperature by mouth above 102 F (38.9 C), not controlled by medicine. MAKE SURE YOU:  Understand these instructions.  Will watch your condition.  Will get help right away if you are not doing well or get worse. Document Released: 08/07/2010 Document Revised: 07/06/2011 Document Reviewed: 08/07/2010 [facility] Patient Information 2015 [place], [place]. This information is not intended to replace advice given to you by your health care provider. Make sure you discuss any questions you have with your health care provider. Spinal  Fusion Spinal fusion is a procedure to make 2 or more of the bones in your spinal column (vertebrae) grow together (fuse). This procedure stops movement between the vertebrae and can relieve pain and prevent deformity.  Spinal fusion is used to treat the following conditions:  Fractures of the spine.  Herniated disk (the spongy material [cartilage] between the vertebrae).  Abnormal curvatures of the spine, such as scoliosis or kyphosis.  A weak or an unstable spine, caused by infections or tumor. RISKS AND COMPLICATIONS Complications associated with spinal fusion are rare, but they can occur. Possible complications include:  Bleeding.  Infection near the incision.  Nerve damage. Signs of nerve damage are back pain, pain in one or both legs, weakness, or numbness.  Spinal fluid leakage.  Blood clot in your leg, which can move to your lungs.  Difficulty controlling urination or bowel movements. BEFORE THE PROCEDURE  A medical evaluation will be done. This will include a physical exam, blood tests, and imaging exams.  You will talk with an anesthesiologist. This is the person who will be in charge of the anesthesia during the procedure. Spinal fusion usually requires that you are asleep during the procedure (general anesthesia).  You will need to stop taking certain medicines, particularly those associated with an increased risk of bleeding. Ask your caregiver about changing or stopping your regular medicines.  If you smoke, you will need to stop at least 2 weeks before the procedure. Smoking can slow down the healing process,  especially fusion of the vertebrae, and increase the risk of complications.  Do not eat or drink anything for at least 8 hours before the procedure. PROCEDURE  A cut (incision) is made over the vertebrae that will be fused. The back muscles are separated from the vertebrae. If you are having this procedure to treat a herniated disk, the disc material pressing  on the nerve root is removed (decompression). The area where the disk is removed is then filled with extra bone. Bone from another part of your body (autogenous bone) or bone from a bone donor (allograft bone) may be used. The extra bone promotes fusion between the vertebrae. Sometimes, specific medicines are added to the fusion area to promote bone healing. In most cases, screws and rods or metal plates will be used to attach the vertebrae to stabilize them while they fuse.  AFTER THE PROCEDURE   You will stay in a recovery area until the anesthesia has worn off. Your blood pressure and pulse will be checked frequently.  You will be given antibiotics to prevent infection.  You may continue to receive fluids through an intravenous (IV) tube while you are still in the hospital.  Pain after surgery is normal. You will be given pain medicine.  You will be taught how to move correctly and how to stand and walk. While in bed, you will be instructed to turn frequently, using a "log rolling" technique, in which the entire body is moved without twisting the back. Document Released: 01/10/2003 Document Revised: 07/06/2011 Document Reviewed: 06/26/2010 [facility] Patient Information 2015 [place], [place]. This information is not intended to replace advice given to you by your health care provider. Make sure you discuss any questions you have with your health care provider.

## 2014-05-13 NOTE — Discharge Summary (Signed)
Physician Discharge Summary  Patient ID: Margaret Guerrero MRN: [id number] DOB/AGE: November 17, 1940 74 y.o.  Admit date: 05/11/2014 Discharge date: 05/13/2014  Admission Diagnoses:lumbar degenerative disc disease  Discharge Diagnoses:  Active Problems:   S/P lumbar spinal fusion   Discharged Condition: no pain  Hospital Course: surgery  Consults: none  Significant Diagnostic Studies: mri  Treatments: lumbar fusion  Discharge Exam: Blood pressure 145/62, pulse 81, temperature 98.3 F (36.8 C), temperature source Oral, resp. rate 18, weight 67.586 kg (149 lb), SpO2 99 %. No weakness  Disposition: 01-Home or Self Care     Medication List    ASK your doctor about these medications        calcium carbonate 1250 MG tablet  Commonly known as:  OS-CAL - dosed in mg of elemental calcium  Take 1 tablet by mouth daily.     cholecalciferol 1000 UNITS tablet  Commonly known as:  VITAMIN D  Take 1,000 Units by mouth daily.     cyclobenzaprine 10 MG tablet  Commonly known as:  FLEXERIL  Take 10 mg by mouth 3 (three) times daily as needed for muscle spasms.     dicyclomine 20 MG tablet  Commonly known as:  BENTYL  Take 1 tablet (20 mg total) by mouth 2 (two) times daily.     FISH OIL PO  Take 1 capsule by mouth 2 (two) times daily.     glucosamine-chondroitin 500-400 MG tablet  Take 1 tablet by mouth daily.     levothyroxine 50 MCG tablet  Commonly known as:  SYNTHROID, LEVOTHROID  Take 50 mcg by mouth daily before breakfast.     lisinopril 10 MG tablet  Commonly known as:  PRINIVIL,ZESTRIL  Take 10 mg by mouth daily.     magnesium 30 MG tablet  Take 30 mg by mouth daily.     meloxicam 7.5 MG tablet  Commonly known as:  MOBIC  Take 7.5 mg by mouth daily.     multivitamin with minerals Tabs tablet  Take 1 tablet by mouth daily.     ondansetron 4 MG disintegrating tablet  Commonly known as:  ZOFRAN ODT  4mg  ODT q4 hours prn nausea/vomit     PROBIOTIC DAILY PO  Take  by mouth.     vitamin C 250 MG tablet  Commonly known as:  ASCORBIC ACID  Take 250 mg by mouth daily.         Signed: Floyce Stakes 05/13/2014, 10:43 AM

## 2014-05-14 NOTE — Progress Notes (Signed)
UR COMPLETED  

## 2014-05-16 ENCOUNTER — Encounter (HOSPITAL_COMMUNITY): Payer: Self-pay | Admitting: Neurological Surgery

## 2014-07-06 ENCOUNTER — Emergency Department (HOSPITAL_COMMUNITY)
Admission: EM | Admit: 2014-07-06 | Discharge: 2014-07-06 | Disposition: A | Discharge status: Home / Self Care | Payer: Federal, State, Local not specified - PPO | Attending: Emergency Medicine | Admitting: Emergency Medicine

## 2014-07-06 ENCOUNTER — Encounter (HOSPITAL_COMMUNITY): Payer: Self-pay | Admitting: Emergency Medicine

## 2014-07-06 DIAGNOSIS — E039 Hypothyroidism, unspecified: Secondary | ICD-10-CM | POA: Insufficient documentation

## 2014-07-06 DIAGNOSIS — K921 Melena: Secondary | ICD-10-CM | POA: Diagnosis present

## 2014-07-06 DIAGNOSIS — R197 Diarrhea, unspecified: Secondary | ICD-10-CM | POA: Diagnosis not present

## 2014-07-06 DIAGNOSIS — M1990 Unspecified osteoarthritis, unspecified site: Secondary | ICD-10-CM | POA: Diagnosis not present

## 2014-07-06 DIAGNOSIS — I10 Essential (primary) hypertension: Secondary | ICD-10-CM | POA: Diagnosis not present

## 2014-07-06 DIAGNOSIS — E780 Pure hypercholesterolemia: Secondary | ICD-10-CM | POA: Insufficient documentation

## 2014-07-06 DIAGNOSIS — Z8781 Personal history of (healed) traumatic fracture: Secondary | ICD-10-CM | POA: Insufficient documentation

## 2014-07-06 DIAGNOSIS — Z85828 Personal history of other malignant neoplasm of skin: Secondary | ICD-10-CM | POA: Diagnosis not present

## 2014-07-06 DIAGNOSIS — Z880 Allergy status to penicillin: Secondary | ICD-10-CM | POA: Insufficient documentation

## 2014-07-06 DIAGNOSIS — Z791 Long term (current) use of non-steroidal anti-inflammatories (NSAID): Secondary | ICD-10-CM | POA: Insufficient documentation

## 2014-07-06 DIAGNOSIS — Z79899 Other long term (current) drug therapy: Secondary | ICD-10-CM | POA: Diagnosis not present

## 2014-07-06 LAB — URINALYSIS, ROUTINE W REFLEX MICROSCOPIC
Bilirubin Urine: NEGATIVE
Glucose, UA: NEGATIVE mg/dL
KETONES UR: NEGATIVE mg/dL
Nitrite: NEGATIVE
Protein, ur: NEGATIVE mg/dL
Specific Gravity, Urine: 1.003 — ABNORMAL LOW (ref 1.005–1.030)
Urobilinogen, UA: 0.2 mg/dL (ref 0.0–1.0)
pH: 6.5 (ref 5.0–8.0)

## 2014-07-06 LAB — CBC WITH DIFFERENTIAL/PLATELET
Basophils Absolute: 0 10*3/uL (ref 0.0–0.1)
Basophils Relative: 0 % (ref 0–1)
Eosinophils Absolute: 0.2 10*3/uL (ref 0.0–0.7)
Eosinophils Relative: 3 % (ref 0–5)
HEMATOCRIT: 38.3 % (ref 36.0–46.0)
Hemoglobin: 12.6 g/dL (ref 12.0–15.0)
LYMPHS ABS: 1 10*3/uL (ref 0.7–4.0)
LYMPHS PCT: 20 % (ref 12–46)
MCH: 27.6 pg (ref 26.0–34.0)
MCHC: 32.9 g/dL (ref 30.0–36.0)
MCV: 84 fL (ref 78.0–100.0)
MONOS PCT: 7 % (ref 3–12)
Monocytes Absolute: 0.4 10*3/uL (ref 0.1–1.0)
NEUTROS ABS: 3.6 10*3/uL (ref 1.7–7.7)
Neutrophils Relative %: 70 % (ref 43–77)
PLATELETS: 239 10*3/uL (ref 150–400)
RBC: 4.56 MIL/uL (ref 3.87–5.11)
RDW: 13.1 % (ref 11.5–15.5)
WBC: 5.2 10*3/uL (ref 4.0–10.5)

## 2014-07-06 LAB — URINE MICROSCOPIC-ADD ON

## 2014-07-06 LAB — COMPREHENSIVE METABOLIC PANEL
ALT: 16 U/L (ref 0–35)
AST: 23 U/L (ref 0–37)
Albumin: 4.1 g/dL (ref 3.5–5.2)
Alkaline Phosphatase: 66 U/L (ref 39–117)
Anion gap: 8 (ref 5–15)
BILIRUBIN TOTAL: 0.7 mg/dL (ref 0.3–1.2)
BUN: 10 mg/dL (ref 6–23)
CO2: 26 mmol/L (ref 19–32)
CREATININE: 0.89 mg/dL (ref 0.50–1.10)
Calcium: 9.4 mg/dL (ref 8.4–10.5)
Chloride: 98 mmol/L (ref 96–112)
GFR calc Af Amer: 72 mL/min — ABNORMAL LOW (ref >90)
GFR, EST NON AFRICAN AMERICAN: 62 mL/min — AB (ref >90)
Glucose, Bld: 204 mg/dL — ABNORMAL HIGH (ref 70–99)
Potassium: 3.6 mmol/L (ref 3.5–5.1)
Sodium: 132 mmol/L — ABNORMAL LOW (ref 135–145)
Total Protein: 7.2 g/dL (ref 6.0–8.3)

## 2014-07-06 LAB — POC OCCULT BLOOD, ED: FECAL OCCULT BLD: POSITIVE — AB

## 2014-07-06 NOTE — ED Notes (Signed)
md at bedside  Pt alert and oriented x4. Respirations even and unlabored, bilateral symmetrical rise and fall of chest. Skin warm and dry. In no acute distress. Denies needs.   

## 2014-07-06 NOTE — ED Notes (Signed)
Pt sent over from [facility], seen for blood in stool with diarrhea. Last month was on antibiotics for dental infections, 2 days ago diarrhea started but woke up today with blood in stool. Pt feels as if she is dehydrated.

## 2014-07-06 NOTE — ED Notes (Signed)
Pt states she will be leaving going on trip for 2weeks, pt given instructions to follow up with her PCP on return or go to ED should s/s return or worsen while she is out of town.

## 2014-07-06 NOTE — Discharge Instructions (Signed)
Bloody Diarrhea °Bloody diarrhea can be caused by many different conditions. Most of the time bloody diarrhea is the result of food poisoning or minor infections. Bloody diarrhea usually improves over 2 to 3 days of rest and fluid replacement. Other conditions that can cause bloody diarrhea include: °· Internal bleeding. °· Infection. °· Diseases of the bowel and colon. °Internal bleeding from an ulcer or bowel disease can be severe and requires hospital care or even surgery. °DIAGNOSIS  °To find out what is wrong your caregiver may check your: °· Stool. °· Blood. °· Results from a test that looks inside the body (endoscopy). °TREATMENT  °· Get plenty of rest. °· Drink enough water and fluids to keep your urine clear or pale yellow. °· Do not smoke. °· Solid foods and dairy products should be avoided until your illness improves. °· As you improve, slowly return to a regular diet with easily-digested foods first. Examples are: °¨ Bananas. °¨ Rice. °¨ Toast. °¨ Crackers. °You should only need these for about 2 days before adding more normal foods to your diet. °· Avoid spicy or fatty foods as well as caffeine and alcohol for several days. °· Medicine to control cramping and diarrhea can relieve symptoms but may prolong some cases of bloody diarrhea. Antibiotics can speed recovery from diarrhea due to some bacterial infections. Call your caregiver if diarrhea does not get better in 3 days. °SEEK MEDICAL CARE IF:  °· You do not improve after 3 days. °· Your diarrhea improves but your stool appears black. °SEEK IMMEDIATE MEDICAL CARE IF:  °· You become extremely weak or faint. °· You become very sweaty. °· You have increased pain or bleeding. °· You develop repeated vomiting. °· You vomit and you see blood or the vomit looks black in color. °· You have a fever. °Document Released: 04/13/2005 Document Revised: 07/06/2011 Document Reviewed: 03/15/2009 °[employer]® Patient Information ©2015 [employer]. This information is  not intended to replace advice given to you by your health care provider. Make sure you discuss any questions you have with your health care provider. ° °

## 2014-07-06 NOTE — ED Notes (Signed)
Per Aniceto Boss in the lab, print requisitions, give sterile cup and requisitions to patient to take home and give instructions to bring back to lab.

## 2014-07-06 NOTE — ED Provider Notes (Signed)
CSN: [id number]     Arrival date & time 07/06/14  1604 History   First MD Initiated Contact with Patient 07/06/14 1737     Chief Complaint  Patient presents with  . Blood In Stools  . sent by PCP      (Consider location/radiation/quality/duration/timing/severity/associated sxs/prior Treatment) HPI Patient has had diarrhea for the past 4 days. Initially she had about 3 episodes on Monday. The following day she had multiple recurrent episodes. Wednesday she was seen by her primary care provider and started on Imodium. There has been some slowing of the bowel movements since starting Imodium. She reports today she had 3 smaller bowel movements and the third one she saw some blood in it. She reports that was tinged with blood, there was no clot present. She has not had any associated fever. She reports she has had some cramping diffuse abdominal pain as been migratory. There is been no vomiting but she has had decreased appetite. The patient was sent from her family 47 office for further evaluation of blood in stool. The patient poor she's also had urinary frequency. She has had several courses of antibiotics within the past 3 months. One for UTI and 1 for dental infection. She states her husband had a diarrheal illness as well about 3 weeks ago. His is since resolved. She denies any lightheadedness or dizziness. No syncope. Past Medical History  Diagnosis Date  . Ankle fracture   . Hypertension   . Hypercholesteremia   . Hypothyroidism   . Acute appendicitis s/p lap appy 08/21/2012 08/21/2012    Diagnosis Appendix, Other than Incidental - TRANSMURAL ACUTE APPENDICITIS AND PERIAPPENDICITIS.   Marland Kitchen History of hiatal hernia   . Arthritis   . Cancer     skin cancers removed   Past Surgical History  Procedure Laterality Date  . Thyroidectomy  1978  . Ankle fusion  2006  . Laparoscopic tubal ligation    . Laparoscopic appendectomy N/A 08/21/2012    Procedure: APPENDECTOMY LAPAROSCOPIC;   Surgeon: Adin Hector, MD;  Location: [facility];  Service: General;  Laterality: N/A;  . Appendectomy    . Tubal ligation    . Tonsillectomy    . Carpal tunnel release      right wrist  . Ganglion cyst excision      on the left  1964  . Skin cancers      removed  . Maximum access (mas)posterior lumbar interbody fusion (plif) 1 level N/A 05/11/2014    Procedure: Maximum Access Surgery Posterior Lumbar Interbody Fusion Lumbar four-five, Laminectomy Lumbar three Lumbar four;  Surgeon: Eustace Moore, MD;  Location: [place];  Service: Neurosurgery;  Laterality: N/A;   Family History  Problem Relation Age of Onset  . Diabetes Mother   . Stroke Mother   . Alzheimer's disease Father   . Cancer Maternal Aunt     bone  . Cancer Paternal Aunt     lung  . 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  Appendectomy as a teenager.  No metastatic disease   History  Substance Use Topics  . Smoking status: Never Smoker   . Smokeless tobacco: Never Used  . Alcohol Use: Yes     Comment: occasionally   OB History    No data available     Review of Systems 10 Systems reviewed and are negative for acute change except as noted in the HPI.    Allergies  Ciprofloxacin;  Isoflurane; and Penicillins  Home Medications   Prior to Admission medications   Medication Sig Start Date End Date Taking? Authorizing Provider  calcium carbonate (OS-CAL - DOSED IN MG OF ELEMENTAL CALCIUM) 1250 MG tablet Take 1 tablet by mouth daily.   Yes Historical Provider, MD  cholecalciferol (VITAMIN D) 1000 UNITS tablet Take 1,000 Units by mouth daily.   Yes Historical Provider, MD  cyclobenzaprine (FLEXERIL) 10 MG tablet Take 10 mg by mouth 3 (three) times daily as needed for muscle spasms.   Yes Historical Provider, MD  glucosamine-chondroitin 500-400 MG tablet Take 1 tablet by mouth daily.   Yes Historical Provider, MD  levothyroxine (SYNTHROID, LEVOTHROID) 50 MCG tablet Take 50 mcg by mouth  daily before breakfast.   Yes Historical Provider, MD  lisinopril (PRINIVIL,ZESTRIL) 10 MG tablet Take 10 mg by mouth daily.   Yes Historical Provider, MD  magnesium 30 MG tablet Take 30 mg by mouth daily.   Yes Historical Provider, MD  meloxicam (MOBIC) 7.5 MG tablet Take 7.5 mg by mouth daily.   Yes Historical Provider, MD  Multiple Vitamin (MULTIVITAMIN WITH MINERALS) TABS Take 1 tablet by mouth daily.   Yes Historical Provider, MD  Omega-3 Fatty Acids (FISH OIL PO) Take 1 capsule by mouth 2 (two) times daily.   Yes Historical Provider, MD  Probiotic Product (PROBIOTIC DAILY PO) Take by mouth.   Yes Historical Provider, MD  vitamin C (ASCORBIC ACID) 250 MG tablet Take 250 mg by mouth daily.   Yes Historical Provider, MD  dicyclomine (BENTYL) 20 MG tablet Take 1 tablet (20 mg total) by mouth 2 (two) times daily. Patient not taking: Reported on 05/02/2014 08/24/12   Julianne Rice, MD  ondansetron (ZOFRAN ODT) 4 MG disintegrating tablet 4mg  ODT q4 hours prn nausea/vomit Patient not taking: Reported on 05/02/2014 08/24/12   Julianne Rice, MD   BP 133/56 mmHg  Pulse 83  Temp(Src) 98.6 F (37 C) (Oral)  Resp 14  SpO2 100% Physical Exam  Constitutional: She is oriented to person, place, and time. She appears well-developed and well-nourished.  HENT:  Head: Normocephalic and atraumatic.  Eyes: EOM are normal. Pupils are equal, round, and reactive to light.  Neck: Neck supple.  Cardiovascular: Normal rate, regular rhythm, normal heart sounds and intact distal pulses.   Pulmonary/Chest: Effort normal and breath sounds normal.  Abdominal: Soft. Bowel sounds are normal. She exhibits no distension. There is no tenderness.  Genitourinary:  On digital rectal examination there is soft formed stool in the vault. It is brown. There is no melena present no visible blood. Normal perianal examination.  Musculoskeletal: Normal range of motion. She exhibits no edema.  Neurological: She is alert and oriented  to person, place, and time. She has normal strength. Coordination normal. GCS eye subscore is 4. GCS verbal subscore is 5. GCS motor subscore is 6.  Skin: Skin is warm, dry and intact.  Psychiatric: She has a normal mood and affect.    ED Course  Procedures (including critical care time) Labs Review Labs Reviewed  COMPREHENSIVE METABOLIC PANEL - Abnormal; Notable for the following:    Sodium 132 (*)    Glucose, Bld 204 (*)    GFR calc non Af Amer 62 (*)    GFR calc Af Amer 72 (*)    All other components within normal limits  POC OCCULT BLOOD, ED - Abnormal; Notable for the following:    Fecal Occult Bld POSITIVE (*)    All other components within normal  limits  STOOL CULTURE  CLOSTRIDIUM DIFFICILE BY PCR  CBC WITH DIFFERENTIAL/PLATELET  URINALYSIS, ROUTINE W REFLEX MICROSCOPIC    Imaging Review No results found.   EKG Interpretation None      MDM   Final diagnoses:  Diarrhea   The patient has had several days of diarrhea and today noted a bloody stool. Her general condition is good she is nontoxic and alert she is not orthostatic and has not been experiencing lightheadedness with position changes. The stool does test heme positive however inspection shows it to be brown in appearance with no melena or visible blood. At this time findings are consistent with a diarrheal illness most likely infectious in nature. The patient has been on antibiotics in the past however at this point in time I do not fill it appeared treatment for C. difficile as indicated. Stool cultures should be obtained and treated if necessary.    Charlesetta Shanks, MD 07/06/14 2116

## 2014-07-06 NOTE — ED Notes (Signed)
Requested pt for a urine sample for urinalysis, states she is unable to provide one at this time. RN advised pt the importance of the urine sample with regards to delay times.

## 2014-07-07 ENCOUNTER — Telehealth (HOSPITAL_BASED_OUTPATIENT_CLINIC_OR_DEPARTMENT_OTHER): Payer: Self-pay | Admitting: Emergency Medicine

## 2014-07-07 ENCOUNTER — Other Ambulatory Visit (HOSPITAL_COMMUNITY)
Admission: RE | Admit: 2014-07-07 | Discharge: 2014-07-07 | Disposition: A | Discharge status: Home / Self Care | Payer: Federal, State, Local not specified - PPO | Source: Ambulatory Visit | Attending: Emergency Medicine | Admitting: Emergency Medicine

## 2014-07-07 DIAGNOSIS — K921 Melena: Secondary | ICD-10-CM | POA: Insufficient documentation

## 2014-07-07 LAB — CLOSTRIDIUM DIFFICILE BY PCR: Toxigenic C. Difficile by PCR: POSITIVE — AB

## 2014-07-07 NOTE — Telephone Encounter (Signed)
Positive c-diff. Chart sent to EDP for review

## 2014-07-11 LAB — STOOL CULTURE: SPECIAL REQUESTS: NORMAL

## 2014-07-16 NOTE — ED Notes (Signed)
Pt aware of lab results. Has followed up with PCP and been treated. Feeling much better now

## 2015-03-28 DIAGNOSIS — Z1151 Encounter for screening for human papillomavirus (HPV): Secondary | ICD-10-CM | POA: Diagnosis not present

## 2015-03-28 DIAGNOSIS — Z01419 Encounter for gynecological examination (general) (routine) without abnormal findings: Secondary | ICD-10-CM | POA: Diagnosis not present

## 2015-05-01 DIAGNOSIS — L821 Other seborrheic keratosis: Secondary | ICD-10-CM | POA: Diagnosis not present

## 2015-05-01 DIAGNOSIS — I788 Other diseases of capillaries: Secondary | ICD-10-CM | POA: Diagnosis not present

## 2015-05-01 DIAGNOSIS — Z85828 Personal history of other malignant neoplasm of skin: Secondary | ICD-10-CM | POA: Diagnosis not present

## 2015-05-01 DIAGNOSIS — L570 Actinic keratosis: Secondary | ICD-10-CM | POA: Diagnosis not present

## 2015-05-02 DIAGNOSIS — S61401A Unspecified open wound of right hand, initial encounter: Secondary | ICD-10-CM | POA: Diagnosis not present

## 2015-05-02 DIAGNOSIS — Z1231 Encounter for screening mammogram for malignant neoplasm of breast: Secondary | ICD-10-CM | POA: Diagnosis not present

## 2015-05-02 DIAGNOSIS — M810 Age-related osteoporosis without current pathological fracture: Secondary | ICD-10-CM | POA: Diagnosis not present

## 2015-07-08 DIAGNOSIS — S0502XA Injury of conjunctiva and corneal abrasion without foreign body, left eye, initial encounter: Secondary | ICD-10-CM | POA: Diagnosis not present

## 2015-07-08 DIAGNOSIS — H1045 Other chronic allergic conjunctivitis: Secondary | ICD-10-CM | POA: Diagnosis not present

## 2015-07-11 DIAGNOSIS — H1045 Other chronic allergic conjunctivitis: Secondary | ICD-10-CM | POA: Diagnosis not present

## 2015-07-11 DIAGNOSIS — R7301 Impaired fasting glucose: Secondary | ICD-10-CM | POA: Diagnosis not present

## 2015-07-11 DIAGNOSIS — I10 Essential (primary) hypertension: Secondary | ICD-10-CM | POA: Diagnosis not present

## 2015-07-11 DIAGNOSIS — E7800 Pure hypercholesterolemia, unspecified: Secondary | ICD-10-CM | POA: Diagnosis not present

## 2015-07-11 DIAGNOSIS — R7309 Other abnormal glucose: Secondary | ICD-10-CM | POA: Diagnosis not present

## 2015-07-11 DIAGNOSIS — G4762 Sleep related leg cramps: Secondary | ICD-10-CM | POA: Diagnosis not present

## 2015-07-11 DIAGNOSIS — R7303 Prediabetes: Secondary | ICD-10-CM | POA: Diagnosis not present

## 2015-07-11 DIAGNOSIS — Z820 Family history of epilepsy and other diseases of the nervous system: Secondary | ICD-10-CM | POA: Diagnosis not present

## 2015-07-11 DIAGNOSIS — E039 Hypothyroidism, unspecified: Secondary | ICD-10-CM | POA: Diagnosis not present

## 2015-07-12 DIAGNOSIS — H1013 Acute atopic conjunctivitis, bilateral: Secondary | ICD-10-CM | POA: Diagnosis not present

## 2015-08-15 DIAGNOSIS — M531 Cervicobrachial syndrome: Secondary | ICD-10-CM | POA: Diagnosis not present

## 2015-08-15 DIAGNOSIS — M9902 Segmental and somatic dysfunction of thoracic region: Secondary | ICD-10-CM | POA: Diagnosis not present

## 2015-08-15 DIAGNOSIS — M9905 Segmental and somatic dysfunction of pelvic region: Secondary | ICD-10-CM | POA: Diagnosis not present

## 2015-08-15 DIAGNOSIS — M9901 Segmental and somatic dysfunction of cervical region: Secondary | ICD-10-CM | POA: Diagnosis not present

## 2015-08-15 DIAGNOSIS — M5032 Other cervical disc degeneration, mid-cervical region, unspecified level: Secondary | ICD-10-CM | POA: Diagnosis not present

## 2015-08-19 DIAGNOSIS — M9905 Segmental and somatic dysfunction of pelvic region: Secondary | ICD-10-CM | POA: Diagnosis not present

## 2015-08-19 DIAGNOSIS — M5032 Other cervical disc degeneration, mid-cervical region, unspecified level: Secondary | ICD-10-CM | POA: Diagnosis not present

## 2015-08-19 DIAGNOSIS — M9902 Segmental and somatic dysfunction of thoracic region: Secondary | ICD-10-CM | POA: Diagnosis not present

## 2015-08-19 DIAGNOSIS — M9901 Segmental and somatic dysfunction of cervical region: Secondary | ICD-10-CM | POA: Diagnosis not present

## 2015-08-19 DIAGNOSIS — M531 Cervicobrachial syndrome: Secondary | ICD-10-CM | POA: Diagnosis not present

## 2015-08-27 DIAGNOSIS — W19XXXA Unspecified fall, initial encounter: Secondary | ICD-10-CM | POA: Diagnosis not present

## 2015-08-27 DIAGNOSIS — S2231XA Fracture of one rib, right side, initial encounter for closed fracture: Secondary | ICD-10-CM | POA: Diagnosis not present

## 2015-08-27 DIAGNOSIS — R0781 Pleurodynia: Secondary | ICD-10-CM | POA: Diagnosis not present

## 2015-09-01 ENCOUNTER — Encounter (HOSPITAL_COMMUNITY): Payer: Self-pay | Admitting: *Deleted

## 2015-09-01 ENCOUNTER — Emergency Department (HOSPITAL_COMMUNITY)
Admission: EM | Admit: 2015-09-01 | Discharge: 2015-09-01 | Disposition: A | Discharge status: Home / Self Care | Payer: Medicare Other | Attending: Emergency Medicine | Admitting: Emergency Medicine

## 2015-09-01 DIAGNOSIS — W010XXA Fall on same level from slipping, tripping and stumbling without subsequent striking against object, initial encounter: Secondary | ICD-10-CM | POA: Diagnosis not present

## 2015-09-01 DIAGNOSIS — S2231XA Fracture of one rib, right side, initial encounter for closed fracture: Secondary | ICD-10-CM | POA: Diagnosis not present

## 2015-09-01 DIAGNOSIS — Y939 Activity, unspecified: Secondary | ICD-10-CM | POA: Diagnosis not present

## 2015-09-01 DIAGNOSIS — Y999 Unspecified external cause status: Secondary | ICD-10-CM | POA: Insufficient documentation

## 2015-09-01 DIAGNOSIS — Y9289 Other specified places as the place of occurrence of the external cause: Secondary | ICD-10-CM | POA: Diagnosis not present

## 2015-09-01 DIAGNOSIS — S2241XA Multiple fractures of ribs, right side, initial encounter for closed fracture: Secondary | ICD-10-CM | POA: Diagnosis not present

## 2015-09-01 DIAGNOSIS — S299XXA Unspecified injury of thorax, initial encounter: Secondary | ICD-10-CM | POA: Diagnosis present

## 2015-09-01 DIAGNOSIS — I10 Essential (primary) hypertension: Secondary | ICD-10-CM | POA: Insufficient documentation

## 2015-09-01 MED ORDER — KETOROLAC TROMETHAMINE 15 MG/ML IJ SOLN
15.0000 mg | Freq: Once | INTRAMUSCULAR | Status: AC
  Administered 2015-09-01: 15 mg via INTRAMUSCULAR
  Filled 2015-09-01: qty 1

## 2015-09-01 MED ORDER — HYDROMORPHONE HCL 1 MG/ML IJ SOLN
0.7500 mg | Freq: Once | INTRAMUSCULAR | Status: AC
  Administered 2015-09-01: 0.75 mg via INTRAMUSCULAR
  Filled 2015-09-01: qty 1

## 2015-09-01 MED ORDER — OXYCODONE-ACETAMINOPHEN 5-325 MG PO TABS: 1.0000 | ORAL_TABLET | ORAL | Status: DC | PRN

## 2015-09-01 NOTE — ED Notes (Signed)
Pt complains of increased pain in her right side after fracturing 2 ribs last Tuesday. Pt states it is difficult to breath due to the pain. Pt states she last took vicodin at 1PM today.

## 2015-09-01 NOTE — Discharge Instructions (Signed)

## 2015-09-01 NOTE — ED Notes (Signed)
Pt given an incentive spirometer and instructions for use.  Pt demonstrated correct usage and expressed understanding of frequency of use.

## 2015-09-01 NOTE — ED Provider Notes (Signed)
CSN: [id number]     Arrival date & time 09/01/15  1720 History   First MD Initiated Contact with Patient 09/01/15 1731     Chief Complaint  Patient presents with  . Rib Injury     (Consider location/radiation/quality/duration/timing/severity/associated sxs/prior Treatment) HPI   75 year old female with right-sided chest pain. She had mechanical fall on Tuesday. She tripped over uneven ground and fell onto her right side. She was evaluated at urgent care facility reports having imaging significant for 2 right-sided rib fractures. Pain has increased within the past two days. No new injuries. Pain worse with movement, breathing.   Past Medical History  Diagnosis Date  . Hypertension    History reviewed. No pertinent past surgical history. No family history on file. Social History  Substance Use Topics  . Smoking status: Never Smoker   . Smokeless tobacco: None  . Alcohol Use: Yes   OB History    No data available     Review of Systems  All systems reviewed and negative, other than as noted in HPI.   Allergies  Review of patient's allergies indicates not on file.  Home Medications   Prior to Admission medications   Not on File   BP 159/79 mmHg  Pulse 82  Temp(Src) 97.7 F (36.5 C) (Oral)  Resp 18  SpO2 97% Physical Exam  Constitutional: She appears well-developed and well-nourished. No distress.  HENT:  Head: Normocephalic and atraumatic.  Eyes: Conjunctivae are normal. Right eye exhibits no discharge. Left eye exhibits no discharge.  Neck: Neck supple.  Cardiovascular: Normal rate, regular rhythm and normal heart sounds.  Exam reveals no gallop and no friction rub.   No murmur heard. Pulmonary/Chest: Effort normal and breath sounds normal. No respiratory distress. She exhibits tenderness.  R lateral CW tenderness. No crepitus. No underlying skin changes. Lungs clear b/l.   Abdominal: Soft. She exhibits no distension. There is no tenderness.  Musculoskeletal:  She exhibits no edema or tenderness.  Neurological: She is alert.  Skin: Skin is warm and dry.  Psychiatric: She has a normal mood and affect. Her behavior is normal. Thought content normal.  Nursing note and vitals reviewed.   ED Course  Procedures (including critical care time) Labs Review Labs Reviewed - No data to display  Imaging Review No results found. I have personally reviewed and evaluated these images and lab results as part of my medical decision-making.   EKG Interpretation None      MDM   Final diagnoses:  Rib fractures, right, closed, initial encounter    74yF with persistent R sided CP. Lungs clear. No respiratory distress. Primarily pain control issues. Will change meds. Return precautions dicussed. Incentive spirometer.     Virgel Manifold, MD 09/01/15 [phone]

## 2015-09-02 ENCOUNTER — Encounter (HOSPITAL_COMMUNITY): Payer: Self-pay | Admitting: Emergency Medicine

## 2015-10-07 DIAGNOSIS — I10 Essential (primary) hypertension: Secondary | ICD-10-CM | POA: Diagnosis not present

## 2015-10-07 DIAGNOSIS — S80812A Abrasion, left lower leg, initial encounter: Secondary | ICD-10-CM | POA: Diagnosis not present

## 2015-10-07 DIAGNOSIS — L089 Local infection of the skin and subcutaneous tissue, unspecified: Secondary | ICD-10-CM | POA: Diagnosis not present

## 2015-10-12 DIAGNOSIS — L03116 Cellulitis of left lower limb: Secondary | ICD-10-CM | POA: Diagnosis not present

## 2015-10-17 DIAGNOSIS — S80812A Abrasion, left lower leg, initial encounter: Secondary | ICD-10-CM | POA: Diagnosis not present

## 2015-10-17 DIAGNOSIS — L089 Local infection of the skin and subcutaneous tissue, unspecified: Secondary | ICD-10-CM | POA: Diagnosis not present

## 2015-10-17 DIAGNOSIS — L97921 Non-pressure chronic ulcer of unspecified part of left lower leg limited to breakdown of skin: Secondary | ICD-10-CM | POA: Diagnosis not present

## 2015-10-24 DIAGNOSIS — L089 Local infection of the skin and subcutaneous tissue, unspecified: Secondary | ICD-10-CM | POA: Diagnosis not present

## 2015-10-24 DIAGNOSIS — L97921 Non-pressure chronic ulcer of unspecified part of left lower leg limited to breakdown of skin: Secondary | ICD-10-CM | POA: Diagnosis not present

## 2015-11-01 DIAGNOSIS — L97921 Non-pressure chronic ulcer of unspecified part of left lower leg limited to breakdown of skin: Secondary | ICD-10-CM | POA: Diagnosis not present

## 2015-11-06 DIAGNOSIS — L821 Other seborrheic keratosis: Secondary | ICD-10-CM | POA: Diagnosis not present

## 2015-11-06 DIAGNOSIS — D1801 Hemangioma of skin and subcutaneous tissue: Secondary | ICD-10-CM | POA: Diagnosis not present

## 2015-11-06 DIAGNOSIS — Z85828 Personal history of other malignant neoplasm of skin: Secondary | ICD-10-CM | POA: Diagnosis not present

## 2015-11-06 DIAGNOSIS — L570 Actinic keratosis: Secondary | ICD-10-CM | POA: Diagnosis not present

## 2015-11-06 DIAGNOSIS — L817 Pigmented purpuric dermatosis: Secondary | ICD-10-CM | POA: Diagnosis not present

## 2016-01-06 DIAGNOSIS — S81811S Laceration without foreign body, right lower leg, sequela: Secondary | ICD-10-CM | POA: Diagnosis not present

## 2016-01-16 DIAGNOSIS — E039 Hypothyroidism, unspecified: Secondary | ICD-10-CM | POA: Diagnosis not present

## 2016-01-16 DIAGNOSIS — R7309 Other abnormal glucose: Secondary | ICD-10-CM | POA: Diagnosis not present

## 2016-01-16 DIAGNOSIS — E7800 Pure hypercholesterolemia, unspecified: Secondary | ICD-10-CM | POA: Diagnosis not present

## 2016-01-16 DIAGNOSIS — S81811S Laceration without foreign body, right lower leg, sequela: Secondary | ICD-10-CM | POA: Diagnosis not present

## 2016-01-16 DIAGNOSIS — I10 Essential (primary) hypertension: Secondary | ICD-10-CM | POA: Diagnosis not present

## 2016-01-21 DIAGNOSIS — R7309 Other abnormal glucose: Secondary | ICD-10-CM | POA: Diagnosis not present

## 2016-01-21 DIAGNOSIS — E7800 Pure hypercholesterolemia, unspecified: Secondary | ICD-10-CM | POA: Diagnosis not present

## 2016-01-21 DIAGNOSIS — E039 Hypothyroidism, unspecified: Secondary | ICD-10-CM | POA: Diagnosis not present

## 2016-01-21 DIAGNOSIS — S81811S Laceration without foreign body, right lower leg, sequela: Secondary | ICD-10-CM | POA: Diagnosis not present

## 2016-01-21 DIAGNOSIS — I10 Essential (primary) hypertension: Secondary | ICD-10-CM | POA: Diagnosis not present

## 2016-01-23 DIAGNOSIS — E7800 Pure hypercholesterolemia, unspecified: Secondary | ICD-10-CM | POA: Diagnosis not present

## 2016-01-23 DIAGNOSIS — M62838 Other muscle spasm: Secondary | ICD-10-CM | POA: Diagnosis not present

## 2016-01-23 DIAGNOSIS — R7309 Other abnormal glucose: Secondary | ICD-10-CM | POA: Diagnosis not present

## 2016-01-23 DIAGNOSIS — E039 Hypothyroidism, unspecified: Secondary | ICD-10-CM | POA: Diagnosis not present

## 2016-01-23 DIAGNOSIS — I10 Essential (primary) hypertension: Secondary | ICD-10-CM | POA: Diagnosis not present

## 2016-03-27 DIAGNOSIS — M19072 Primary osteoarthritis, left ankle and foot: Secondary | ICD-10-CM | POA: Diagnosis not present

## 2016-03-27 DIAGNOSIS — Z981 Arthrodesis status: Secondary | ICD-10-CM | POA: Diagnosis not present

## 2016-05-26 DIAGNOSIS — R7303 Prediabetes: Secondary | ICD-10-CM | POA: Diagnosis not present

## 2016-05-26 DIAGNOSIS — I10 Essential (primary) hypertension: Secondary | ICD-10-CM | POA: Diagnosis not present

## 2016-05-26 DIAGNOSIS — E039 Hypothyroidism, unspecified: Secondary | ICD-10-CM | POA: Diagnosis not present

## 2016-05-26 DIAGNOSIS — M62838 Other muscle spasm: Secondary | ICD-10-CM | POA: Diagnosis not present

## 2016-05-26 DIAGNOSIS — E7800 Pure hypercholesterolemia, unspecified: Secondary | ICD-10-CM | POA: Diagnosis not present

## 2016-05-27 DIAGNOSIS — L570 Actinic keratosis: Secondary | ICD-10-CM | POA: Diagnosis not present

## 2016-05-27 DIAGNOSIS — D1801 Hemangioma of skin and subcutaneous tissue: Secondary | ICD-10-CM | POA: Diagnosis not present

## 2016-05-27 DIAGNOSIS — L718 Other rosacea: Secondary | ICD-10-CM | POA: Diagnosis not present

## 2016-05-27 DIAGNOSIS — Z85828 Personal history of other malignant neoplasm of skin: Secondary | ICD-10-CM | POA: Diagnosis not present

## 2016-05-27 DIAGNOSIS — L821 Other seborrheic keratosis: Secondary | ICD-10-CM | POA: Diagnosis not present

## 2016-05-28 DIAGNOSIS — I10 Essential (primary) hypertension: Secondary | ICD-10-CM | POA: Diagnosis not present

## 2016-05-28 DIAGNOSIS — M62838 Other muscle spasm: Secondary | ICD-10-CM | POA: Diagnosis not present

## 2016-05-28 DIAGNOSIS — E039 Hypothyroidism, unspecified: Secondary | ICD-10-CM | POA: Diagnosis not present

## 2016-05-28 DIAGNOSIS — R7309 Other abnormal glucose: Secondary | ICD-10-CM | POA: Diagnosis not present

## 2016-05-28 DIAGNOSIS — E7800 Pure hypercholesterolemia, unspecified: Secondary | ICD-10-CM | POA: Diagnosis not present

## 2016-07-28 DIAGNOSIS — S81811A Laceration without foreign body, right lower leg, initial encounter: Secondary | ICD-10-CM | POA: Diagnosis not present

## 2016-08-05 DIAGNOSIS — Z1231 Encounter for screening mammogram for malignant neoplasm of breast: Secondary | ICD-10-CM | POA: Diagnosis not present

## 2016-08-06 DIAGNOSIS — M25551 Pain in right hip: Secondary | ICD-10-CM | POA: Diagnosis not present

## 2016-08-06 DIAGNOSIS — M25561 Pain in right knee: Secondary | ICD-10-CM | POA: Diagnosis not present

## 2016-08-06 DIAGNOSIS — M7061 Trochanteric bursitis, right hip: Secondary | ICD-10-CM | POA: Diagnosis not present

## 2016-09-02 DIAGNOSIS — M25511 Pain in right shoulder: Secondary | ICD-10-CM | POA: Diagnosis not present

## 2016-09-07 DIAGNOSIS — M1711 Unilateral primary osteoarthritis, right knee: Secondary | ICD-10-CM | POA: Diagnosis not present

## 2016-09-07 DIAGNOSIS — M25561 Pain in right knee: Secondary | ICD-10-CM | POA: Diagnosis not present

## 2016-09-17 DIAGNOSIS — M2041 Other hammer toe(s) (acquired), right foot: Secondary | ICD-10-CM | POA: Diagnosis not present

## 2016-10-05 DIAGNOSIS — R7303 Prediabetes: Secondary | ICD-10-CM | POA: Diagnosis not present

## 2016-10-05 DIAGNOSIS — M25561 Pain in right knee: Secondary | ICD-10-CM | POA: Diagnosis not present

## 2016-10-05 DIAGNOSIS — I10 Essential (primary) hypertension: Secondary | ICD-10-CM | POA: Diagnosis not present

## 2016-10-05 DIAGNOSIS — E039 Hypothyroidism, unspecified: Secondary | ICD-10-CM | POA: Diagnosis not present

## 2016-10-05 DIAGNOSIS — M62838 Other muscle spasm: Secondary | ICD-10-CM | POA: Diagnosis not present

## 2016-10-05 DIAGNOSIS — M1711 Unilateral primary osteoarthritis, right knee: Secondary | ICD-10-CM | POA: Diagnosis not present

## 2016-10-05 DIAGNOSIS — E7800 Pure hypercholesterolemia, unspecified: Secondary | ICD-10-CM | POA: Diagnosis not present

## 2016-10-13 DIAGNOSIS — I10 Essential (primary) hypertension: Secondary | ICD-10-CM | POA: Diagnosis not present

## 2016-10-13 DIAGNOSIS — E039 Hypothyroidism, unspecified: Secondary | ICD-10-CM | POA: Diagnosis not present

## 2016-10-13 DIAGNOSIS — E7800 Pure hypercholesterolemia, unspecified: Secondary | ICD-10-CM | POA: Diagnosis not present

## 2016-10-13 DIAGNOSIS — M62838 Other muscle spasm: Secondary | ICD-10-CM | POA: Diagnosis not present

## 2016-10-13 DIAGNOSIS — R7309 Other abnormal glucose: Secondary | ICD-10-CM | POA: Diagnosis not present

## 2016-10-14 DIAGNOSIS — M1711 Unilateral primary osteoarthritis, right knee: Secondary | ICD-10-CM | POA: Diagnosis not present

## 2016-10-22 DIAGNOSIS — M1711 Unilateral primary osteoarthritis, right knee: Secondary | ICD-10-CM | POA: Diagnosis not present

## 2016-11-03 ENCOUNTER — Emergency Department (HOSPITAL_COMMUNITY)
Admission: EM | Admit: 2016-11-03 | Discharge: 2016-11-03 | Disposition: A | Discharge status: Home / Self Care | Payer: Medicare Other | Attending: Emergency Medicine | Admitting: Emergency Medicine

## 2016-11-03 ENCOUNTER — Encounter (HOSPITAL_COMMUNITY): Payer: Self-pay

## 2016-11-03 DIAGNOSIS — S81812A Laceration without foreign body, left lower leg, initial encounter: Secondary | ICD-10-CM | POA: Diagnosis not present

## 2016-11-03 DIAGNOSIS — Y9301 Activity, walking, marching and hiking: Secondary | ICD-10-CM | POA: Insufficient documentation

## 2016-11-03 DIAGNOSIS — S8991XA Unspecified injury of right lower leg, initial encounter: Secondary | ICD-10-CM | POA: Diagnosis present

## 2016-11-03 DIAGNOSIS — E039 Hypothyroidism, unspecified: Secondary | ICD-10-CM | POA: Diagnosis not present

## 2016-11-03 DIAGNOSIS — Z23 Encounter for immunization: Secondary | ICD-10-CM | POA: Insufficient documentation

## 2016-11-03 DIAGNOSIS — Z791 Long term (current) use of non-steroidal anti-inflammatories (NSAID): Secondary | ICD-10-CM | POA: Insufficient documentation

## 2016-11-03 DIAGNOSIS — S81811A Laceration without foreign body, right lower leg, initial encounter: Secondary | ICD-10-CM | POA: Diagnosis not present

## 2016-11-03 DIAGNOSIS — I10 Essential (primary) hypertension: Secondary | ICD-10-CM | POA: Diagnosis not present

## 2016-11-03 DIAGNOSIS — Z85828 Personal history of other malignant neoplasm of skin: Secondary | ICD-10-CM | POA: Insufficient documentation

## 2016-11-03 DIAGNOSIS — Z79899 Other long term (current) drug therapy: Secondary | ICD-10-CM | POA: Insufficient documentation

## 2016-11-03 DIAGNOSIS — Y92007 Garden or yard of unspecified non-institutional (private) residence as the place of occurrence of the external cause: Secondary | ICD-10-CM | POA: Diagnosis not present

## 2016-11-03 DIAGNOSIS — W228XXA Striking against or struck by other objects, initial encounter: Secondary | ICD-10-CM | POA: Insufficient documentation

## 2016-11-03 DIAGNOSIS — Y999 Unspecified external cause status: Secondary | ICD-10-CM | POA: Insufficient documentation

## 2016-11-03 MED ORDER — TETANUS-DIPHTH-ACELL PERTUSSIS 5-2.5-18.5 LF-MCG/0.5 IM SUSP
0.5000 mL | Freq: Once | INTRAMUSCULAR | Status: AC
  Administered 2016-11-03: 0.5 mL via INTRAMUSCULAR
  Filled 2016-11-03: qty 0.5

## 2016-11-03 NOTE — ED Provider Notes (Signed)
[facility] DEPT Provider Note   CSN: [id number] Arrival date & time: 11/03/16  2123     History   Chief Complaint Chief Complaint  Patient presents with  . Extremity Laceration    HPI Margaret Guerrero is a 76 y.o. female.  HPI Margaret Guerrero is a 76 y.o. female with history of arthritis, hypertension, presents to emergency department complaining of skin tear. Patient states that she was outside walking through the grass, with her daughter, when something hit her leg. She sustained a skin tear. Bleeding controlled with pressure. Patient states she washed it immediately and came here for repair. She states last time she developed similar skin tear she did not seek medical attention and it resulted in cellulitis. She is unsure of her tetanus. She denies any other complaints.   Past Medical History:  Diagnosis Date  . Acute appendicitis s/p lap appy 08/21/2012 08/21/2012   Diagnosis Appendix, Other than Incidental - TRANSMURAL ACUTE APPENDICITIS AND PERIAPPENDICITIS.   Marland Kitchen Ankle fracture   . Arthritis   . Cancer ([place])    skin cancers removed  . History of hiatal hernia   . Hypercholesteremia   . Hypertension   . Hypothyroidism     Patient Active Problem List   Diagnosis Date Noted  . S/P lumbar spinal fusion 05/11/2014  . Sciatic pain 09/07/2012  . Acute appendicitis s/p lap appy 08/21/2012 08/21/2012  . Hypertension     Past Surgical History:  Procedure Laterality Date  . ANKLE FUSION  2006  . APPENDECTOMY    . CARPAL TUNNEL RELEASE     right wrist  . GANGLION CYST EXCISION     on the left  1964  . LAPAROSCOPIC APPENDECTOMY N/A 08/21/2012   Procedure: APPENDECTOMY LAPAROSCOPIC;  Surgeon: Adin Hector, MD;  Location: [facility];  Service: General;  Laterality: N/A;  . LAPAROSCOPIC TUBAL LIGATION    . MAXIMUM ACCESS (MAS)POSTERIOR LUMBAR INTERBODY FUSION (PLIF) 1 LEVEL N/A 05/11/2014   Procedure: Maximum Access Surgery Posterior Lumbar Interbody Fusion Lumbar four-five,  Laminectomy Lumbar three Lumbar four;  Surgeon: Eustace Moore, MD;  Location: [facility];  Service: Neurosurgery;  Laterality: N/A;  . skin cancers     removed  . THYROIDECTOMY  1978  . TONSILLECTOMY    . TUBAL LIGATION      OB History    Gravida Para Term Preterm AB Living   0 0 0 0 0     SAB TAB Ectopic Multiple Live Births   0 0 0           Home Medications    Prior to Admission medications   Medication Sig Start Date End Date Taking? Authorizing Provider  calcium carbonate (OS-CAL - DOSED IN MG OF ELEMENTAL CALCIUM) 1250 MG tablet Take 1 tablet by mouth daily.    [provider]  cholecalciferol (VITAMIN D) 1000 UNITS tablet Take 1,000 Units by mouth daily.    [provider]  cyclobenzaprine (FLEXERIL) 10 MG tablet Take 10 mg by mouth 3 (three) times daily as needed for muscle spasms.    [provider]  dicyclomine (BENTYL) 20 MG tablet Take 1 tablet (20 mg total) by mouth 2 (two) times daily. Patient not taking: Reported on 05/02/2014 08/24/12   Julianne Rice, MD  glucosamine-chondroitin 500-400 MG tablet Take 1 tablet by mouth daily.    [provider]  levothyroxine (SYNTHROID, LEVOTHROID) 50 MCG tablet Take 50 mcg by mouth daily before breakfast.    [provider]  lisinopril (PRINIVIL,ZESTRIL) 10 MG tablet Take 10 mg by mouth daily.    [provider]  magnesium 30 MG tablet Take 30 mg by mouth daily.    [provider]  meloxicam (MOBIC) 7.5 MG tablet Take 7.5 mg by mouth daily.    [provider]  Multiple Vitamin (MULTIVITAMIN WITH MINERALS) TABS Take 1 tablet by mouth daily.    [provider]  Omega-3 Fatty Acids (FISH OIL PO) Take 1 capsule by mouth 2 (two) times daily.    [provider]  ondansetron (ZOFRAN ODT) 4 MG disintegrating tablet 4mg  ODT q4 hours prn nausea/vomit Patient not taking: Reported on 05/02/2014 08/24/12   Julianne Rice, MD  oxyCODONE-acetaminophen  (PERCOCET/ROXICET) 5-325 MG tablet Take 1-2 tablets by mouth every 4 (four) hours as needed for severe pain. 09/01/15   Virgel Manifold, MD  Probiotic Product (PROBIOTIC DAILY PO) Take by mouth.    [provider]  vitamin C (ASCORBIC ACID) 250 MG tablet Take 250 mg by mouth daily.    [provider]    Family History Family History  Problem Relation Age of Onset  . Diabetes Mother   . Stroke Mother   . Alzheimer's disease Father   . Cancer Maternal Aunt        bone  . Cancer Paternal Aunt        lung  . 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  Appendectomy as a teenager.  No metastatic disease    Social History Social History  Substance Use Topics  . Smoking status: Never Smoker  . Smokeless tobacco: Never Used  . Alcohol use Yes     Comment: occasionally     Allergies   Ciprofloxacin; Isoflurane; and Penicillins   Review of Systems Review of Systems  Musculoskeletal: Positive for arthralgias.  Skin: Positive for wound.  Neurological: Negative for weakness and numbness.  All other systems reviewed and are negative.    Physical Exam Updated Vital Signs BP (!) 143/68 (BP Location: Left Arm)   Pulse 77   Temp 98.3 F (36.8 C) (Oral)   Resp 18   Ht 5\' 4"  (1.626 m)   Wt 68 kg (150 lb)   SpO2 100%   BMI 25.75 kg/m   Physical Exam  Constitutional: She is oriented to person, place, and time. She appears well-developed and well-nourished. No distress.  Eyes: Conjunctivae are normal.  Neck: Neck supple.  Neurological: She is alert and oriented to person, place, and time.  Skin: Skin is warm and dry.  4 x 6 cm skin tear to the right distal lateral shin. Hemostatic. Superficial.  Nursing note and vitals reviewed.    ED Treatments / Results  Labs (all labs ordered are listed, but only abnormal results are displayed) Labs Reviewed - No data to display  EKG  EKG Interpretation None       Radiology No  results found.  Procedures Procedures (including critical care time)  Medications Ordered in ED Medications - No data to display   Initial Impression / Assessment and Plan / ED Course  I have reviewed the triage vital signs and the nursing notes.  Pertinent labs & imaging results that were available during my care of the patient were reviewed by me and considered in my medical decision making (see chart for details).     Patient with skin tear to the right lower leg. Skin tear is superficial.  Dead skin trimmed back, laceration irrigated copiously with safe cleanse and saline. Nonstick gauze applied topically with a dressing. Home with careful wound care, follow up as needed. Advised to apply antibacterial ointment twice a day. Tetanus updated. Home with outpatient follow-up.  Vitals:   11/03/16 2142  BP: (!) 143/68  Pulse: 77  Resp: 18  Temp: 98.3 F (36.8 C)  TempSrc: Oral  SpO2: 100%  Weight: 68 kg (150 lb)  Height: 5\' 4"  (1.626 m)     Final Clinical Impressions(s) / ED Diagnoses   Final diagnoses:  Skin tear of right lower leg without complication, initial encounter    New Prescriptions New Prescriptions   No medications on file     Jeannett Senior, Hershal Coria 11/03/16 2247    Julianne Rice, MD 11/03/16 2309

## 2016-11-03 NOTE — ED Triage Notes (Signed)
Pt has a skin tear on her leg from a stump, bleeding controlled at present

## 2016-11-03 NOTE — Discharge Instructions (Signed)
Clean your wound twice a day. Apply nonstick dressing and gauze. Allow it to air out at times to dry. He can also apply bacitracin ointment when changing the dressing. Watch for any signs of infection. Follow-up with family doctor as needed.

## 2016-11-10 DIAGNOSIS — L03115 Cellulitis of right lower limb: Secondary | ICD-10-CM | POA: Diagnosis not present

## 2016-11-10 DIAGNOSIS — S80811A Abrasion, right lower leg, initial encounter: Secondary | ICD-10-CM | POA: Diagnosis not present

## 2016-11-13 DIAGNOSIS — M25561 Pain in right knee: Secondary | ICD-10-CM | POA: Diagnosis not present

## 2016-11-13 DIAGNOSIS — L03115 Cellulitis of right lower limb: Secondary | ICD-10-CM | POA: Diagnosis not present

## 2016-11-13 DIAGNOSIS — S80811D Abrasion, right lower leg, subsequent encounter: Secondary | ICD-10-CM | POA: Diagnosis not present

## 2016-11-13 DIAGNOSIS — L0390 Cellulitis, unspecified: Secondary | ICD-10-CM | POA: Diagnosis not present

## 2016-11-13 DIAGNOSIS — M1711 Unilateral primary osteoarthritis, right knee: Secondary | ICD-10-CM | POA: Diagnosis not present

## 2016-11-19 DIAGNOSIS — L718 Other rosacea: Secondary | ICD-10-CM | POA: Diagnosis not present

## 2016-11-19 DIAGNOSIS — Z85828 Personal history of other malignant neoplasm of skin: Secondary | ICD-10-CM | POA: Diagnosis not present

## 2016-11-19 DIAGNOSIS — L905 Scar conditions and fibrosis of skin: Secondary | ICD-10-CM | POA: Diagnosis not present

## 2016-11-19 DIAGNOSIS — D1801 Hemangioma of skin and subcutaneous tissue: Secondary | ICD-10-CM | POA: Diagnosis not present

## 2016-11-19 DIAGNOSIS — L821 Other seborrheic keratosis: Secondary | ICD-10-CM | POA: Diagnosis not present

## 2016-11-19 DIAGNOSIS — L570 Actinic keratosis: Secondary | ICD-10-CM | POA: Diagnosis not present

## 2016-11-19 DIAGNOSIS — L03115 Cellulitis of right lower limb: Secondary | ICD-10-CM | POA: Diagnosis not present

## 2016-11-27 DIAGNOSIS — M1711 Unilateral primary osteoarthritis, right knee: Secondary | ICD-10-CM | POA: Diagnosis not present

## 2016-12-03 NOTE — Progress Notes (Signed)
Please place orders in EPIC as patient is being scheduled for a pre-op appointment! Thank you! 

## 2016-12-12 NOTE — H&P (Signed)
TOTAL KNEE ADMISSION H&P  Patient is being admitted for right total knee arthroplasty.  Subjective:  Chief Complaint: Right knee primary OA / pain  HPI: Margaret Guerrero, 76 y.o. female, has a history of pain and functional disability in the right knee due to arthritis and has failed non-surgical conservative treatments for greater than 12 weeks to include NSAID's and/or analgesics, corticosteriod injections, viscosupplementation injections, use of assistive devices and activity modification.  Onset of symptoms was gradual, starting >10 years ago with gradually worsening course since that time. The patient noted prior procedures on the knee to include  arthroscopy on the right knee(s).  Patient currently rates pain in the right knee(s) at 7 out of 10 with activity. Patient has night pain, worsening of pain with activity and weight bearing, pain that interferes with activities of daily living, pain with passive range of motion, crepitus and joint swelling.  Patient has evidence of periarticular osteophytes and joint space narrowing by imaging studies. There is no active infection.  Risks, benefits and expectations were discussed with the patient.  Risks including but not limited to the risk of anesthesia, blood clots, nerve damage, blood vessel damage, failure of the prosthesis, infection and up to and including death.  Patient understand the risks, benefits and expectations and wishes to proceed with surgery.   PCP: Patient, No Pcp Per  D/C Plans:       Home  Post-op Meds:       No Rx given  Tranexamic Acid:      To be given - IV   Decadron:      Is to be given  FYI:     ASA  Norco  DME:   Rx given for - RW and 3-n-1  PT:   Rx given for OPPT   Patient Active Problem List   Diagnosis Date Noted  . S/P lumbar spinal fusion 05/11/2014  . Sciatic pain 09/07/2012  . Acute appendicitis s/p lap appy 08/21/2012 08/21/2012  . Hypertension    Past Medical History:  Diagnosis Date  . Acute  appendicitis s/p lap appy 08/21/2012 08/21/2012   Diagnosis Appendix, Other than Incidental - TRANSMURAL ACUTE APPENDICITIS AND PERIAPPENDICITIS.   Marland Kitchen Ankle fracture   . Arthritis   . Cancer ([place])    skin cancers removed  . History of hiatal hernia   . Hypercholesteremia   . Hypertension   . Hypothyroidism     Past Surgical History:  Procedure Laterality Date  . ANKLE FUSION  2006  . APPENDECTOMY    . CARPAL TUNNEL RELEASE     right wrist  . GANGLION CYST EXCISION     on the left  1964  . LAPAROSCOPIC APPENDECTOMY N/A 08/21/2012   Procedure: APPENDECTOMY LAPAROSCOPIC;  Surgeon: Adin Hector, MD;  Location: [facility];  Service: General;  Laterality: N/A;  . LAPAROSCOPIC TUBAL LIGATION    . MAXIMUM ACCESS (MAS)POSTERIOR LUMBAR INTERBODY FUSION (PLIF) 1 LEVEL N/A 05/11/2014   Procedure: Maximum Access Surgery Posterior Lumbar Interbody Fusion Lumbar four-five, Laminectomy Lumbar three Lumbar four;  Surgeon: Eustace Moore, MD;  Location: [facility];  Service: Neurosurgery;  Laterality: N/A;  . skin cancers     removed  . THYROIDECTOMY  1978  . TONSILLECTOMY    . TUBAL LIGATION      No prescriptions prior to admission.   Allergies  Allergen Reactions  . Ciprofloxacin Itching  . Isoflurane     Her father had alzheimers and she's read that this drug  will promote this disease process  . Penicillins Rash    Social History  Substance Use Topics  . Smoking status: Never Smoker  . Smokeless tobacco: Never Used  . Alcohol use Yes     Comment: occasionally    Family History  Problem Relation Age of Onset  . Diabetes Mother   . Stroke Mother   . Alzheimer's disease Father   . Cancer Maternal Aunt        bone  . Cancer Paternal Aunt        lung  . 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  Appendectomy as a teenager.  No metastatic disease     Review of Systems  Constitutional: Negative.   HENT: Negative.   Eyes: Negative.   Respiratory: Negative.    Cardiovascular: Negative.   Gastrointestinal: Negative.   Genitourinary: Positive for frequency.  Musculoskeletal: Positive for back pain and joint pain.  Skin: Negative.   Neurological: Negative.   Endo/Heme/Allergies: Positive for environmental allergies.  Psychiatric/Behavioral: Negative.     Objective:  Physical Exam  Constitutional: She is oriented to person, place, and time. She appears well-developed.  HENT:  Head: Normocephalic.  Eyes: Pupils are equal, round, and reactive to light.  Neck: Neck supple. No JVD present. No tracheal deviation present. No thyromegaly present.  Cardiovascular: Normal rate, regular rhythm and intact distal pulses.   Respiratory: Effort normal and breath sounds normal. No respiratory distress. She has no wheezes.  GI: Soft. There is no tenderness. There is no guarding.  Musculoskeletal:       Right knee: She exhibits decreased range of motion, swelling and bony tenderness. She exhibits no ecchymosis, no deformity, no laceration and no erythema. Tenderness found.  Lymphadenopathy:    She has no cervical adenopathy.  Neurological: She is alert and oriented to person, place, and time.  Skin: Skin is warm and dry.  Psychiatric: She has a normal mood and affect.      Labs:  Estimated body mass index is 25.75 kg/m as calculated from the following:   Height as of 11/03/16: 5\' 4"  (1.626 m).   Weight as of 11/03/16: 68 kg (150 lb).   Imaging Review Plain radiographs demonstrate severe degenerative joint disease of the right knee(s). The overall alignment is neutral. The bone quality appears to be good for age and reported activity level.  Assessment/Plan:  End stage arthritis, right knee   The patient history, physical examination, clinical judgment of the provider and imaging studies are consistent with end stage degenerative joint disease of the right knee(s) and total knee arthroplasty is deemed medically necessary. The treatment options  including medical management, injection therapy arthroscopy and arthroplasty were discussed at length. The risks and benefits of total knee arthroplasty were presented and reviewed. The risks due to aseptic loosening, infection, stiffness, patella tracking problems, thromboembolic complications and other imponderables were discussed. The patient acknowledged the explanation, agreed to proceed with the plan and consent was signed. Patient is being admitted for inpatient treatment for surgery, pain control, PT, OT, prophylactic antibiotics, VTE prophylaxis, progressive ambulation and ADL's and discharge planning. The patient is planning to be discharged home.    [place] Jeanell Mangan   PA-C  12/12/2016, 11:39 AM

## 2016-12-17 DIAGNOSIS — T148XXA Other injury of unspecified body region, initial encounter: Secondary | ICD-10-CM | POA: Diagnosis not present

## 2016-12-17 DIAGNOSIS — R7303 Prediabetes: Secondary | ICD-10-CM | POA: Diagnosis not present

## 2016-12-17 DIAGNOSIS — E7800 Pure hypercholesterolemia, unspecified: Secondary | ICD-10-CM | POA: Diagnosis not present

## 2016-12-17 DIAGNOSIS — E039 Hypothyroidism, unspecified: Secondary | ICD-10-CM | POA: Diagnosis not present

## 2016-12-17 DIAGNOSIS — L03115 Cellulitis of right lower limb: Secondary | ICD-10-CM | POA: Diagnosis not present

## 2016-12-17 DIAGNOSIS — I10 Essential (primary) hypertension: Secondary | ICD-10-CM | POA: Diagnosis not present

## 2016-12-17 DIAGNOSIS — S80811A Abrasion, right lower leg, initial encounter: Secondary | ICD-10-CM | POA: Diagnosis not present

## 2016-12-17 DIAGNOSIS — Z0000 Encounter for general adult medical examination without abnormal findings: Secondary | ICD-10-CM | POA: Diagnosis not present

## 2016-12-21 ENCOUNTER — Other Ambulatory Visit (HOSPITAL_COMMUNITY): Payer: Self-pay | Admitting: Emergency Medicine

## 2016-12-21 NOTE — Progress Notes (Signed)
Surgical clearance Dr Jacelyn Grip 12-17-16 on chart

## 2016-12-21 NOTE — Patient Instructions (Signed)
Margaret Guerrero  12/21/2016   Your procedure is scheduled on: 12-29-16  Report to [facility]  Entrance    Report to admitting at [facility]   Call this number if you have problems the morning of surgery [phone]   Remember: ONLY 1 PERSON MAY GO WITH YOU TO SHORT STAY TO GET  READY MORNING OF YOUR SURGERY.  Do not eat food or drink liquids :After Midnight.     Take these medicines the morning of surgery with A SIP OF WATER: levothyroxine(synthroid)                                You may not have any metal on your body including hair pins and              piercings  Do not wear jewelry, make-up, lotions, powders or perfumes, deodorant             Do not wear nail polish.  Do not shave  48 hours prior to surgery.        Do not bring valuables to the hospital. [place].  Contacts, dentures or bridgework may not be worn into surgery.  Leave suitcase in the car. After surgery it may be brought to your room.               Please read over the following fact sheets you were given: _____________________________________________________________________    [facility] - Preparing for Surgery Before surgery, you can play an important role.  Because skin is not sterile, your skin needs to be as free of germs as possible.  You can reduce the number of germs on your skin by washing with CHG (chlorahexidine gluconate) soap before surgery.  CHG is an antiseptic cleaner which kills germs and bonds with the skin to continue killing germs even after washing. Please DO NOT use if you have an allergy to CHG or antibacterial soaps.  If your skin becomes reddened/irritated stop using the CHG and inform your nurse when you arrive at Short Stay. Do not shave (including legs and underarms) for at least 48 hours prior to the first CHG shower.  You may shave your face/neck. Please follow these instructions carefully:  1.  Shower with  CHG Soap the night before surgery and the  morning of Surgery.  2.  If you choose to wash your hair, wash your hair first as usual with your  normal  shampoo.  3.  After you shampoo, rinse your hair and body thoroughly to remove the  shampoo.                           4.  Use CHG as you would any other liquid soap.  You can apply chg directly  to the skin and wash                       Gently with a scrungie or clean washcloth.  5.  Apply the CHG Soap to your body ONLY FROM THE NECK DOWN.   Do not use on face/ open  Wound or open sores. Avoid contact with eyes, ears mouth and genitals (private parts).                       Wash face,  Genitals (private parts) with your normal soap.             6.  Wash thoroughly, paying special attention to the area where your surgery  will be performed.  7.  Thoroughly rinse your body with warm water from the neck down.  8.  DO NOT shower/wash with your normal soap after using and rinsing off  the CHG Soap.                9.  Pat yourself dry with a clean towel.            10.  Wear clean pajamas.            11.  Place clean sheets on your bed the night of your first shower and do not  sleep with pets. Day of Surgery : Do not apply any lotions/deodorants the morning of surgery.  Please wear clean clothes to the hospital/surgery center.  FAILURE TO FOLLOW THESE INSTRUCTIONS MAY RESULT IN THE CANCELLATION OF YOUR SURGERY PATIENT SIGNATURE_________________________________  NURSE SIGNATURE__________________________________  ________________________________________________________________________   Adam Phenix  An incentive spirometer is a tool that can help keep your lungs clear and active. This tool measures how well you are filling your lungs with each breath. Taking long deep breaths may help reverse or decrease the chance of developing breathing (pulmonary) problems (especially infection) following:  A long period of  time when you are unable to move or be active. BEFORE THE PROCEDURE   If the spirometer includes an indicator to show your best effort, your nurse or respiratory therapist will set it to a desired goal.  If possible, sit up straight or lean slightly forward. Try not to slouch.  Hold the incentive spirometer in an upright position. INSTRUCTIONS FOR USE  1. Sit on the edge of your bed if possible, or sit up as far as you can in bed or on a chair. 2. Hold the incentive spirometer in an upright position. 3. Breathe out normally. 4. Place the mouthpiece in your mouth and seal your lips tightly around it. 5. Breathe in slowly and as deeply as possible, raising the piston or the ball toward the top of the column. 6. Hold your breath for 3-5 seconds or for as long as possible. Allow the piston or ball to fall to the bottom of the column. 7. Remove the mouthpiece from your mouth and breathe out normally. 8. Rest for a few seconds and repeat Steps 1 through 7 at least 10 times every 1-2 hours when you are awake. Take your time and take a few normal breaths between deep breaths. 9. The spirometer may include an indicator to show your best effort. Use the indicator as a goal to work toward during each repetition. 10. After each set of 10 deep breaths, practice coughing to be sure your lungs are clear. If you have an incision (the cut made at the time of surgery), support your incision when coughing by placing a pillow or rolled up towels firmly against it. Once you are able to get out of bed, walk around indoors and cough well. You may stop using the incentive spirometer when instructed by your caregiver.  RISKS AND COMPLICATIONS  Take your time so you do not get  dizzy or light-headed.  If you are in pain, you may need to take or ask for pain medication before doing incentive spirometry. It is harder to take a deep breath if you are having pain. AFTER USE  Rest and breathe slowly and easily.  It can  be helpful to keep track of a log of your progress. Your caregiver can provide you with a simple table to help with this. If you are using the spirometer at home, follow these instructions: [place] IF:   You are having difficultly using the spirometer.  You have trouble using the spirometer as often as instructed.  Your pain medication is not giving enough relief while using the spirometer.  You develop fever of 100.5 F (38.1 C) or higher. SEEK IMMEDIATE MEDICAL CARE IF:   You cough up bloody sputum that had not been present before.  You develop fever of 102 F (38.9 C) or greater.  You develop worsening pain at or near the incision site. MAKE SURE YOU:   Understand these instructions.  Will watch your condition.  Will get help right away if you are not doing well or get worse. Document Released: 08/24/2006 Document Revised: 07/06/2011 Document Reviewed: 10/25/2006 ExitCare Patient Information 2014 ExitCare, [place].   ________________________________________________________________________  WHAT IS A BLOOD TRANSFUSION? Blood Transfusion Information  A transfusion is the replacement of blood or some of its parts. Blood is made up of multiple cells which provide different functions.  Red blood cells carry oxygen and are used for blood loss replacement.  White blood cells fight against infection.  Platelets control bleeding.  Plasma helps clot blood.  Other blood products are available for specialized needs, such as hemophilia or other clotting disorders. BEFORE THE TRANSFUSION  Who gives blood for transfusions?   Healthy volunteers who are fully evaluated to make sure their blood is safe. This is blood bank blood. Transfusion therapy is the safest it has ever been in the practice of medicine. Before blood is taken from a donor, a complete history is taken to make sure that person has no history of diseases nor engages in risky social behavior (examples are  intravenous drug use or sexual activity with multiple partners). The donor's travel history is screened to minimize risk of transmitting infections, such as malaria. The donated blood is tested for signs of infectious diseases, such as HIV and hepatitis. The blood is then tested to be sure it is compatible with you in order to minimize the chance of a transfusion reaction. If you or a relative donates blood, this is often done in anticipation of surgery and is not appropriate for emergency situations. It takes many days to process the donated blood. RISKS AND COMPLICATIONS Although transfusion therapy is very safe and saves many lives, the main dangers of transfusion include:   Getting an infectious disease.  Developing a transfusion reaction. This is an allergic reaction to something in the blood you were given. Every precaution is taken to prevent this. The decision to have a blood transfusion has been considered carefully by your caregiver before blood is given. Blood is not given unless the benefits outweigh the risks. AFTER THE TRANSFUSION  Right after receiving a blood transfusion, you will usually feel much better and more energetic. This is especially true if your red blood cells have gotten low (anemic). The transfusion raises the level of the red blood cells which carry oxygen, and this usually causes an energy increase.  The nurse administering the transfusion will  monitor you carefully for complications. HOME CARE INSTRUCTIONS  No special instructions are needed after a transfusion. You may find your energy is better. Speak with your caregiver about any limitations on activity for underlying diseases you may have. SEEK MEDICAL CARE IF:   Your condition is not improving after your transfusion.  You develop redness or irritation at the intravenous (IV) site. SEEK IMMEDIATE MEDICAL CARE IF:  Any of the following symptoms occur over the next 12 hours:  Shaking chills.  You have a  temperature by mouth above 102 F (38.9 C), not controlled by medicine.  Chest, back, or muscle pain.  People around you feel you are not acting correctly or are confused.  Shortness of breath or difficulty breathing.  Dizziness and fainting.  You get a rash or develop hives.  You have a decrease in urine output.  Your urine turns a dark color or changes to pink, red, or brown. Any of the following symptoms occur over the next 10 days:  You have a temperature by mouth above 102 F (38.9 C), not controlled by medicine.  Shortness of breath.  Weakness after normal activity.  The white part of the eye turns yellow (jaundice).  You have a decrease in the amount of urine or are urinating less often.  Your urine turns a dark color or changes to pink, red, or brown. Document Released: 04/10/2000 Document Revised: 07/06/2011 Document Reviewed: 11/28/2007 [facility] Patient Information 2014 [place], [place].  _______________________________________________________________________

## 2016-12-22 ENCOUNTER — Encounter (INDEPENDENT_AMBULATORY_CARE_PROVIDER_SITE_OTHER): Payer: Self-pay

## 2016-12-22 ENCOUNTER — Encounter (HOSPITAL_COMMUNITY)
Admission: RE | Admit: 2016-12-22 | Discharge: 2016-12-22 | Disposition: A | Discharge status: Home / Self Care | Payer: Medicare Other | Source: Ambulatory Visit | Attending: Orthopedic Surgery | Admitting: Orthopedic Surgery

## 2016-12-22 ENCOUNTER — Encounter (HOSPITAL_COMMUNITY): Payer: Self-pay

## 2016-12-22 DIAGNOSIS — I10 Essential (primary) hypertension: Secondary | ICD-10-CM | POA: Diagnosis not present

## 2016-12-22 DIAGNOSIS — Z01818 Encounter for other preprocedural examination: Secondary | ICD-10-CM | POA: Diagnosis not present

## 2016-12-22 DIAGNOSIS — Z0183 Encounter for blood typing: Secondary | ICD-10-CM | POA: Diagnosis not present

## 2016-12-22 DIAGNOSIS — M1711 Unilateral primary osteoarthritis, right knee: Secondary | ICD-10-CM | POA: Diagnosis not present

## 2016-12-22 DIAGNOSIS — Z01812 Encounter for preprocedural laboratory examination: Secondary | ICD-10-CM | POA: Insufficient documentation

## 2016-12-22 HISTORY — DX: Migraine, unspecified, not intractable, without status migrainosus: G43.909

## 2016-12-22 HISTORY — DX: Prediabetes: R73.03

## 2016-12-22 LAB — CBC
HCT: 39.4 % (ref 36.0–46.0)
HEMOGLOBIN: 13.3 g/dL (ref 12.0–15.0)
MCH: 28.6 pg (ref 26.0–34.0)
MCHC: 33.8 g/dL (ref 30.0–36.0)
MCV: 84.7 fL (ref 78.0–100.0)
Platelets: 253 10*3/uL (ref 150–400)
RBC: 4.65 MIL/uL (ref 3.87–5.11)
RDW: 13.7 % (ref 11.5–15.5)
WBC: 7 10*3/uL (ref 4.0–10.5)

## 2016-12-22 LAB — BASIC METABOLIC PANEL
ANION GAP: 8 (ref 5–15)
BUN: 24 mg/dL — ABNORMAL HIGH (ref 6–20)
CO2: 29 mmol/L (ref 22–32)
Calcium: 10 mg/dL (ref 8.9–10.3)
Chloride: 100 mmol/L — ABNORMAL LOW (ref 101–111)
Creatinine, Ser: 0.98 mg/dL (ref 0.44–1.00)
GFR calc Af Amer: >60 mL/min (ref >60)
GFR calc non Af Amer: 55 mL/min — ABNORMAL LOW (ref >60)
GLUCOSE: 93 mg/dL (ref 65–99)
Potassium: 4.4 mmol/L (ref 3.5–5.1)
Sodium: 137 mmol/L (ref 135–145)

## 2016-12-22 LAB — ABO/RH: ABO/RH(D): B POS

## 2016-12-22 LAB — SURGICAL PCR SCREEN
MRSA, PCR: NEGATIVE
STAPHYLOCOCCUS AUREUS: NEGATIVE

## 2016-12-22 LAB — HEMOGLOBIN A1C
HEMOGLOBIN A1C: 5.7 % — AB (ref 4.8–5.6)
Mean Plasma Glucose: 116.89 mg/dL

## 2016-12-23 NOTE — Progress Notes (Signed)
RN called and spoke to Margaret Guerrero to inform of surgery time change. She is now aware to arrive at 0600 to admitting for  Check in and that surgery will start at [place] .

## 2016-12-29 ENCOUNTER — Inpatient Hospital Stay (HOSPITAL_COMMUNITY): Payer: Medicare Other | Admitting: Anesthesiology

## 2016-12-29 ENCOUNTER — Inpatient Hospital Stay (HOSPITAL_COMMUNITY)
Admission: RE | Admit: 2016-12-29 | Discharge: 2016-12-31 | DRG: 470 | Disposition: A | Discharge status: Home / Self Care | Payer: Medicare Other | Source: Ambulatory Visit | Attending: Orthopedic Surgery | Admitting: Orthopedic Surgery

## 2016-12-29 ENCOUNTER — Encounter (HOSPITAL_COMMUNITY)
Admission: RE | Disposition: A | Discharge status: Home / Self Care | Payer: Self-pay | Source: Ambulatory Visit | Attending: Orthopedic Surgery

## 2016-12-29 ENCOUNTER — Encounter (HOSPITAL_COMMUNITY): Payer: Self-pay | Admitting: Anesthesiology

## 2016-12-29 DIAGNOSIS — E039 Hypothyroidism, unspecified: Secondary | ICD-10-CM | POA: Diagnosis present

## 2016-12-29 DIAGNOSIS — Z981 Arthrodesis status: Secondary | ICD-10-CM | POA: Diagnosis not present

## 2016-12-29 DIAGNOSIS — Z6825 Body mass index (BMI) 25.0-25.9, adult: Secondary | ICD-10-CM

## 2016-12-29 DIAGNOSIS — Z96651 Presence of right artificial knee joint: Secondary | ICD-10-CM

## 2016-12-29 DIAGNOSIS — Z883 Allergy status to other anti-infective agents status: Secondary | ICD-10-CM | POA: Diagnosis not present

## 2016-12-29 DIAGNOSIS — M1711 Unilateral primary osteoarthritis, right knee: Principal | ICD-10-CM | POA: Diagnosis present

## 2016-12-29 DIAGNOSIS — Z881 Allergy status to other antibiotic agents status: Secondary | ICD-10-CM

## 2016-12-29 DIAGNOSIS — Z880 Allergy status to penicillin: Secondary | ICD-10-CM | POA: Diagnosis not present

## 2016-12-29 DIAGNOSIS — I10 Essential (primary) hypertension: Secondary | ICD-10-CM | POA: Diagnosis present

## 2016-12-29 DIAGNOSIS — E663 Overweight: Secondary | ICD-10-CM | POA: Diagnosis not present

## 2016-12-29 DIAGNOSIS — K219 Gastro-esophageal reflux disease without esophagitis: Secondary | ICD-10-CM | POA: Diagnosis not present

## 2016-12-29 DIAGNOSIS — G8918 Other acute postprocedural pain: Secondary | ICD-10-CM | POA: Diagnosis not present

## 2016-12-29 DIAGNOSIS — Z96659 Presence of unspecified artificial knee joint: Secondary | ICD-10-CM

## 2016-12-29 HISTORY — PX: TOTAL KNEE ARTHROPLASTY: SHX125

## 2016-12-29 LAB — TYPE AND SCREEN
ABO/RH(D): B POS
Antibody Screen: NEGATIVE

## 2016-12-29 SURGERY — ARTHROPLASTY, KNEE, TOTAL
Anesthesia: Spinal | Site: Knee | Laterality: Right

## 2016-12-29 MED ORDER — FENTANYL CITRATE (PF) 100 MCG/2ML IJ SOLN
25.0000 ug | INTRAMUSCULAR | Status: DC | PRN
  Filled 2016-12-29 (×2): qty 2

## 2016-12-29 MED ORDER — FENTANYL CITRATE (PF) 100 MCG/2ML IJ SOLN
INTRAMUSCULAR | Status: AC
  Administered 2016-12-29: 100 ug via INTRAVENOUS
  Filled 2016-12-29: qty 2

## 2016-12-29 MED ORDER — STERILE WATER FOR IRRIGATION IR SOLN
Status: DC | PRN
  Administered 2016-12-29: 2000 mL

## 2016-12-29 MED ORDER — SODIUM CHLORIDE 0.9 % IR SOLN
Status: DC | PRN
  Administered 2016-12-29: 1000 mL

## 2016-12-29 MED ORDER — METHOCARBAMOL 500 MG PO TABS: 500.0000 mg | ORAL_TABLET | Freq: Four times a day (QID) | ORAL | 0 refills | Status: DC | PRN

## 2016-12-29 MED ORDER — TRANEXAMIC ACID 1000 MG/10ML IV SOLN
1000.0000 mg | INTRAVENOUS | Status: AC
  Administered 2016-12-29: 1000 mg via INTRAVENOUS
  Filled 2016-12-29: qty 1100

## 2016-12-29 MED ORDER — ONDANSETRON HCL 4 MG/2ML IJ SOLN
INTRAMUSCULAR | Status: AC
  Filled 2016-12-29: qty 2

## 2016-12-29 MED ORDER — PROPOFOL 10 MG/ML IV BOLUS
INTRAVENOUS | Status: DC | PRN
  Administered 2016-12-29: 30 mg via INTRAVENOUS

## 2016-12-29 MED ORDER — ASPIRIN 81 MG PO CHEW: 81.0000 mg | CHEWABLE_TABLET | Freq: Two times a day (BID) | ORAL | 0 refills | Status: AC

## 2016-12-29 MED ORDER — SODIUM CHLORIDE 0.9 % IV SOLN
INTRAVENOUS | Status: DC
  Administered 2016-12-29: 13:00:00 via INTRAVENOUS

## 2016-12-29 MED ORDER — SODIUM CHLORIDE 0.9 % IJ SOLN
INTRAMUSCULAR | Status: DC | PRN
  Administered 2016-12-29: 30 mL

## 2016-12-29 MED ORDER — CEFAZOLIN SODIUM-DEXTROSE 2-4 GM/100ML-% IV SOLN
INTRAVENOUS | Status: AC
Start: 2016-12-29 — End: 2016-12-29
  Filled 2016-12-29: qty 100

## 2016-12-29 MED ORDER — FENTANYL CITRATE (PF) 100 MCG/2ML IJ SOLN
INTRAMUSCULAR | Status: AC
  Filled 2016-12-29: qty 2

## 2016-12-29 MED ORDER — 0.9 % SODIUM CHLORIDE (POUR BTL) OPTIME
TOPICAL | Status: DC | PRN
  Administered 2016-12-29: 1000 mL

## 2016-12-29 MED ORDER — LEVOTHYROXINE SODIUM 50 MCG PO TABS
50.0000 ug | ORAL_TABLET | Freq: Every day | ORAL | Status: DC
  Administered 2016-12-30 – 2016-12-31 (×2): 50 ug via ORAL
  Filled 2016-12-29 (×2): qty 1

## 2016-12-29 MED ORDER — FENTANYL CITRATE (PF) 100 MCG/2ML IJ SOLN
50.0000 ug | INTRAMUSCULAR | Status: DC | PRN
  Administered 2016-12-29: 100 ug via INTRAVENOUS

## 2016-12-29 MED ORDER — DEXAMETHASONE SODIUM PHOSPHATE 10 MG/ML IJ SOLN
10.0000 mg | Freq: Once | INTRAMUSCULAR | Status: AC
  Administered 2016-12-30: 10 mg via INTRAVENOUS
  Filled 2016-12-29: qty 1

## 2016-12-29 MED ORDER — ONDANSETRON HCL 4 MG PO TABS
4.0000 mg | ORAL_TABLET | Freq: Four times a day (QID) | ORAL | Status: DC | PRN
  Administered 2016-12-29 – 2016-12-31 (×3): 4 mg via ORAL
  Filled 2016-12-29 (×2): qty 1

## 2016-12-29 MED ORDER — POLYETHYLENE GLYCOL 3350 17 G PO PACK: 17.0000 g | PACK | Freq: Two times a day (BID) | ORAL | 0 refills | Status: DC

## 2016-12-29 MED ORDER — MAGNESIUM CITRATE PO SOLN: 1.0000 | Freq: Once | ORAL | Status: DC | PRN

## 2016-12-29 MED ORDER — ALUM & MAG HYDROXIDE-SIMETH 200-200-20 MG/5ML PO SUSP: 15.0000 mL | ORAL | Status: DC | PRN

## 2016-12-29 MED ORDER — DIPHENHYDRAMINE HCL 25 MG PO CAPS
25.0000 mg | ORAL_CAPSULE | Freq: Four times a day (QID) | ORAL | Status: DC | PRN
  Administered 2016-12-30 (×2): 25 mg via ORAL
  Filled 2016-12-29 (×2): qty 1

## 2016-12-29 MED ORDER — ROPIVACAINE HCL 7.5 MG/ML IJ SOLN
INTRAMUSCULAR | Status: DC | PRN
  Administered 2016-12-29: 20 mL via PERINEURAL

## 2016-12-29 MED ORDER — PHENOL 1.4 % MT LIQD: 1.0000 | OROMUCOSAL | Status: DC | PRN

## 2016-12-29 MED ORDER — MEPERIDINE HCL 50 MG/ML IJ SOLN: 6.2500 mg | INTRAMUSCULAR | Status: DC | PRN

## 2016-12-29 MED ORDER — DEXAMETHASONE SODIUM PHOSPHATE 10 MG/ML IJ SOLN
INTRAMUSCULAR | Status: AC
  Filled 2016-12-29: qty 1

## 2016-12-29 MED ORDER — HYDROCODONE-ACETAMINOPHEN 7.5-325 MG PO TABS
1.0000 | ORAL_TABLET | ORAL | Status: DC
  Administered 2016-12-29 – 2016-12-30 (×8): 2 via ORAL
  Administered 2016-12-30 – 2016-12-31 (×2): 1 via ORAL
  Administered 2016-12-31: 07:00:00 2 via ORAL
  Administered 2016-12-31: 1 via ORAL
  Filled 2016-12-29: qty 1
  Filled 2016-12-29: qty 2
  Filled 2016-12-29: qty 1
  Filled 2016-12-29 (×9): qty 2

## 2016-12-29 MED ORDER — MIDAZOLAM HCL 2 MG/2ML IJ SOLN: 0.5000 mg | Freq: Once | INTRAMUSCULAR | Status: DC | PRN

## 2016-12-29 MED ORDER — METOCLOPRAMIDE HCL 5 MG PO TABS: 5.0000 mg | ORAL_TABLET | Freq: Three times a day (TID) | ORAL | Status: DC | PRN

## 2016-12-29 MED ORDER — METOCLOPRAMIDE HCL 5 MG/ML IJ SOLN
5.0000 mg | Freq: Three times a day (TID) | INTRAMUSCULAR | Status: DC | PRN
  Administered 2016-12-29: 21:00:00 5 mg via INTRAVENOUS
  Filled 2016-12-29: qty 2

## 2016-12-29 MED ORDER — DOCUSATE SODIUM 100 MG PO CAPS: 100.0000 mg | ORAL_CAPSULE | Freq: Two times a day (BID) | ORAL | 0 refills | Status: DC

## 2016-12-29 MED ORDER — HYDROCODONE-ACETAMINOPHEN 7.5-325 MG PO TABS: 1.0000 | ORAL_TABLET | ORAL | 0 refills | Status: DC | PRN

## 2016-12-29 MED ORDER — POLYETHYLENE GLYCOL 3350 17 G PO PACK
17.0000 g | PACK | Freq: Two times a day (BID) | ORAL | Status: DC
  Administered 2016-12-29 – 2016-12-31 (×4): 17 g via ORAL
  Filled 2016-12-29 (×3): qty 1

## 2016-12-29 MED ORDER — BISACODYL 10 MG RE SUPP: 10.0000 mg | Freq: Every day | RECTAL | Status: DC | PRN

## 2016-12-29 MED ORDER — MIDAZOLAM HCL 2 MG/2ML IJ SOLN
INTRAMUSCULAR | Status: AC
  Filled 2016-12-29: qty 2

## 2016-12-29 MED ORDER — MIDAZOLAM HCL 2 MG/2ML IJ SOLN: 1.0000 mg | INTRAMUSCULAR | Status: DC | PRN

## 2016-12-29 MED ORDER — PHENYLEPHRINE HCL 10 MG/ML IJ SOLN
INTRAMUSCULAR | Status: AC
  Filled 2016-12-29: qty 1

## 2016-12-29 MED ORDER — SODIUM CHLORIDE 0.9 % IV SOLN
1000.0000 mg | Freq: Once | INTRAVENOUS | Status: AC
  Administered 2016-12-29: 13:00:00 1000 mg via INTRAVENOUS
  Filled 2016-12-29: qty 1100

## 2016-12-29 MED ORDER — METHOCARBAMOL 1000 MG/10ML IJ SOLN
500.0000 mg | Freq: Four times a day (QID) | INTRAVENOUS | Status: DC | PRN
  Administered 2016-12-29: 500 mg via INTRAVENOUS
  Filled 2016-12-29: qty 550

## 2016-12-29 MED ORDER — DOCUSATE SODIUM 100 MG PO CAPS
100.0000 mg | ORAL_CAPSULE | Freq: Two times a day (BID) | ORAL | Status: DC
  Administered 2016-12-29 – 2016-12-30 (×3): 100 mg via ORAL
  Filled 2016-12-29 (×4): qty 1

## 2016-12-29 MED ORDER — KETOROLAC TROMETHAMINE 30 MG/ML IJ SOLN
INTRAMUSCULAR | Status: AC
  Filled 2016-12-29: qty 1

## 2016-12-29 MED ORDER — BUPIVACAINE-EPINEPHRINE (PF) 0.25% -1:200000 IJ SOLN
INTRAMUSCULAR | Status: AC
  Filled 2016-12-29: qty 30

## 2016-12-29 MED ORDER — CHLORHEXIDINE GLUCONATE 4 % EX LIQD: 60.0000 mL | Freq: Once | CUTANEOUS | Status: DC

## 2016-12-29 MED ORDER — LACTATED RINGERS IV SOLN: INTRAVENOUS | Status: DC | PRN

## 2016-12-29 MED ORDER — PROMETHAZINE HCL 25 MG/ML IJ SOLN
6.2500 mg | INTRAMUSCULAR | Status: DC | PRN
Start: 2016-12-29 — End: 2016-12-29

## 2016-12-29 MED ORDER — CELECOXIB 200 MG PO CAPS
200.0000 mg | ORAL_CAPSULE | Freq: Two times a day (BID) | ORAL | Status: DC
  Administered 2016-12-29 – 2016-12-31 (×4): 200 mg via ORAL
  Filled 2016-12-29 (×4): qty 1

## 2016-12-29 MED ORDER — DEXAMETHASONE SODIUM PHOSPHATE 10 MG/ML IJ SOLN
10.0000 mg | Freq: Once | INTRAMUSCULAR | Status: AC
  Administered 2016-12-29: 10 mg via INTRAVENOUS

## 2016-12-29 MED ORDER — KETOROLAC TROMETHAMINE 30 MG/ML IJ SOLN
INTRAMUSCULAR | Status: DC | PRN
  Administered 2016-12-29: 30 mg

## 2016-12-29 MED ORDER — HYDROMORPHONE HCL-NACL 0.5-0.9 MG/ML-% IV SOSY: 0.2500 mg | PREFILLED_SYRINGE | INTRAVENOUS | Status: DC | PRN

## 2016-12-29 MED ORDER — ASPIRIN 81 MG PO CHEW
81.0000 mg | CHEWABLE_TABLET | Freq: Two times a day (BID) | ORAL | Status: DC
  Administered 2016-12-29 – 2016-12-31 (×4): 81 mg via ORAL
  Filled 2016-12-29 (×4): qty 1

## 2016-12-29 MED ORDER — PROPOFOL 500 MG/50ML IV EMUL
INTRAVENOUS | Status: DC | PRN
  Administered 2016-12-29: 75 ug/kg/min via INTRAVENOUS

## 2016-12-29 MED ORDER — FERROUS SULFATE 325 (65 FE) MG PO TABS
325.0000 mg | ORAL_TABLET | Freq: Three times a day (TID) | ORAL | Status: DC
  Administered 2016-12-29 – 2016-12-31 (×4): 325 mg via ORAL
  Filled 2016-12-29 (×4): qty 1

## 2016-12-29 MED ORDER — ONDANSETRON HCL 4 MG/2ML IJ SOLN
4.0000 mg | Freq: Four times a day (QID) | INTRAMUSCULAR | Status: DC | PRN
  Filled 2016-12-29: qty 2

## 2016-12-29 MED ORDER — CEFAZOLIN SODIUM-DEXTROSE 2-4 GM/100ML-% IV SOLN
2.0000 g | INTRAVENOUS | Status: AC
  Administered 2016-12-29: 2 g via INTRAVENOUS

## 2016-12-29 MED ORDER — ONDANSETRON HCL 4 MG/2ML IJ SOLN
INTRAMUSCULAR | Status: DC | PRN
  Administered 2016-12-29: 4 mg via INTRAVENOUS

## 2016-12-29 MED ORDER — PHENYLEPHRINE HCL 10 MG/ML IJ SOLN
INTRAMUSCULAR | Status: DC | PRN
  Administered 2016-12-29: 40 ug/min via INTRAVENOUS

## 2016-12-29 MED ORDER — METHOCARBAMOL 500 MG PO TABS
500.0000 mg | ORAL_TABLET | Freq: Four times a day (QID) | ORAL | Status: DC | PRN
  Administered 2016-12-29 – 2016-12-30 (×3): 500 mg via ORAL
  Filled 2016-12-29 (×4): qty 1

## 2016-12-29 MED ORDER — MENTHOL 3 MG MT LOZG: 1.0000 | LOZENGE | OROMUCOSAL | Status: DC | PRN

## 2016-12-29 MED ORDER — CEFAZOLIN SODIUM-DEXTROSE 2-4 GM/100ML-% IV SOLN
2.0000 g | Freq: Four times a day (QID) | INTRAVENOUS | Status: AC
  Administered 2016-12-29 (×2): 2 g via INTRAVENOUS
  Filled 2016-12-29: qty 100

## 2016-12-29 MED ORDER — FERROUS SULFATE 325 (65 FE) MG PO TABS: 325.0000 mg | ORAL_TABLET | Freq: Three times a day (TID) | ORAL | 3 refills | Status: DC

## 2016-12-29 MED ORDER — SODIUM CHLORIDE 0.9 % IJ SOLN
INTRAMUSCULAR | Status: AC
  Filled 2016-12-29: qty 50

## 2016-12-29 MED ORDER — BUPIVACAINE-EPINEPHRINE (PF) 0.25% -1:200000 IJ SOLN
INTRAMUSCULAR | Status: DC | PRN
  Administered 2016-12-29: 30 mL

## 2016-12-29 MED ORDER — BUPIVACAINE IN DEXTROSE 0.75-8.25 % IT SOLN
INTRATHECAL | Status: DC | PRN
  Administered 2016-12-29: 1.6 mL via INTRATHECAL

## 2016-12-29 MED ORDER — PROPOFOL 10 MG/ML IV BOLUS
INTRAVENOUS | Status: AC
  Filled 2016-12-29: qty 60

## 2016-12-29 MED ORDER — LACTATED RINGERS IV SOLN
INTRAVENOUS | Status: DC
  Administered 2016-12-29: 10:00:00 via INTRAVENOUS
  Administered 2016-12-29: 1000 mL via INTRAVENOUS

## 2016-12-29 SURGICAL SUPPLY — 47 items
BAG DECANTER FOR FLEXI CONT (MISCELLANEOUS) IMPLANT
BAG ZIPLOCK 12X15 (MISCELLANEOUS) IMPLANT
BANDAGE ACE 6X5 VEL STRL LF (GAUZE/BANDAGES/DRESSINGS) ×3 IMPLANT
BLADE SAW SGTL 11.0X1.19X90.0M (BLADE) ×3 IMPLANT
BLADE SAW SGTL 13.0X1.19X90.0M (BLADE) ×3 IMPLANT
BOWL SMART MIX CTS (DISPOSABLE) ×3 IMPLANT
CAPT KNEE TOTAL 3 ATTUNE ×3 IMPLANT
CEMENT HV SMART SET (Cement) ×6 IMPLANT
COVER SURGICAL LIGHT HANDLE (MISCELLANEOUS) ×3 IMPLANT
CUFF TOURN SGL QUICK 34 (TOURNIQUET CUFF) ×2
CUFF TRNQT CYL 34X4X40X1 (TOURNIQUET CUFF) ×1 IMPLANT
DECANTER SPIKE VIAL GLASS SM (MISCELLANEOUS) ×3 IMPLANT
DERMABOND ADVANCED (GAUZE/BANDAGES/DRESSINGS) ×2
DERMABOND ADVANCED .7 DNX12 (GAUZE/BANDAGES/DRESSINGS) ×1 IMPLANT
DRAPE U-SHAPE 47X51 STRL (DRAPES) ×3 IMPLANT
DRESSING AQUACEL AG SP 3.5X10 (GAUZE/BANDAGES/DRESSINGS) ×1 IMPLANT
DRSG AQUACEL AG SP 3.5X10 (GAUZE/BANDAGES/DRESSINGS) ×3
DURAPREP 26ML APPLICATOR (WOUND CARE) ×6 IMPLANT
ELECT REM PT RETURN 15FT ADLT (MISCELLANEOUS) ×3 IMPLANT
GLOVE BIOGEL M 7.0 STRL (GLOVE) IMPLANT
GLOVE BIOGEL M STRL SZ7.5 (GLOVE) ×3 IMPLANT
GLOVE BIOGEL PI IND STRL 7.5 (GLOVE) ×1 IMPLANT
GLOVE BIOGEL PI IND STRL 8.5 (GLOVE) IMPLANT
GLOVE BIOGEL PI INDICATOR 7.5 (GLOVE) ×2
GLOVE BIOGEL PI INDICATOR 8.5 (GLOVE)
GLOVE ECLIPSE 8.0 STRL XLNG CF (GLOVE) IMPLANT
GLOVE ORTHO TXT STRL SZ7.5 (GLOVE) ×6 IMPLANT
GOWN STRL REUS W/TWL LRG LVL3 (GOWN DISPOSABLE) ×3 IMPLANT
GOWN STRL REUS W/TWL XL LVL3 (GOWN DISPOSABLE) ×3 IMPLANT
HANDPIECE INTERPULSE COAX TIP (DISPOSABLE) ×2
MANIFOLD NEPTUNE II (INSTRUMENTS) ×3 IMPLANT
PACK TOTAL KNEE CUSTOM (KITS) ×3 IMPLANT
POSITIONER SURGICAL ARM (MISCELLANEOUS) ×3 IMPLANT
SET HNDPC FAN SPRY TIP SCT (DISPOSABLE) ×1 IMPLANT
SET PAD KNEE POSITIONER (MISCELLANEOUS) ×3 IMPLANT
SUT MNCRL AB 4-0 PS2 18 (SUTURE) ×3 IMPLANT
SUT STRATAFIX 0 PDS 27 VIOLET (SUTURE) ×3
SUT VIC AB 1 CT1 36 (SUTURE) ×3 IMPLANT
SUT VIC AB 2-0 CT1 27 (SUTURE) ×6
SUT VIC AB 2-0 CT1 TAPERPNT 27 (SUTURE) ×3 IMPLANT
SUTURE STRATFX 0 PDS 27 VIOLET (SUTURE) ×1 IMPLANT
SYR 50ML LL SCALE MARK (SYRINGE) IMPLANT
TRAY FOLEY CATH 14FRSI W/METER (CATHETERS) ×3 IMPLANT
TRAY FOLEY W/METER SILVER 16FR (SET/KITS/TRAYS/PACK) IMPLANT
WATER STERILE IRR 1500ML POUR (IV SOLUTION) ×3 IMPLANT
WRAP KNEE MAXI GEL POST OP (GAUZE/BANDAGES/DRESSINGS) ×3 IMPLANT
YANKAUER SUCT BULB TIP 10FT TU (MISCELLANEOUS) ×6 IMPLANT

## 2016-12-29 NOTE — Anesthesia Postprocedure Evaluation (Signed)
Anesthesia Post Note  Patient: Margaret Guerrero  Procedure(s) Performed: Procedure(s) (LRB): RIGHT TOTAL KNEE ARTHROPLASTY (Right)     Patient location during evaluation: PACU Anesthesia Type: Spinal Level of consciousness: awake and alert, patient cooperative and oriented Pain management: pain level controlled Vital Signs Assessment: post-procedure vital signs reviewed and stable Respiratory status: spontaneous breathing, nonlabored ventilation, respiratory function stable and patient connected to nasal cannula oxygen Cardiovascular status: stable and blood pressure returned to baseline Postop Assessment: spinal receding and no signs of nausea or vomiting Anesthetic complications: no    Last Vitals:  Vitals:   12/29/16 1234 12/29/16 1336  BP: (!) 144/91 (!) 157/73  Pulse: 68 67  Resp: 16 16  Temp: 36.5 C (!) 36.4 C  SpO2: 100% 100%    Last Pain:  Vitals:   12/29/16 1336  TempSrc: Axillary  PainSc:                  Villa Burgin,E. Aalani Aikens

## 2016-12-29 NOTE — Transfer of Care (Signed)
Immediate Anesthesia Transfer of Care Note  Patient: Margaret Guerrero  Procedure(s) Performed: Procedure(s) with comments: RIGHT TOTAL KNEE ARTHROPLASTY (Right) - 70 mins with regional block  Patient Location: PACU  Anesthesia Type:Spinal  Level of Consciousness:  sedated, patient cooperative and responds to stimulation  Airway & Oxygen Therapy:Patient Spontanous Breathing and Patient connected to face mask oxgen  Post-op Assessment:  Report given to PACU RN and Post -op Vital signs reviewed and stable  Post vital signs:  Reviewed and stable  Last Vitals:  Vitals:   12/29/16 0830 12/29/16 1030  BP: 140/86   Pulse: 70   Resp: 14 14  Temp:    SpO2: 00%     Complications: No apparent anesthesia complications

## 2016-12-29 NOTE — Evaluation (Signed)
Physical Therapy Evaluation Patient Details Name: Margaret Guerrero MRN: [id number] DOB: 01/04/1941 Today's Date: 12/29/2016   History of Present Illness  76 yo female s/p R TKA 12/29/16. Hx of lumbar fusion 2016.   Clinical Impression  On eval POD 0, pt required Min assist for mobility. She walked ~25 feet with a RW. Pain rated 3/10 with activity. Will follow and progress activity as tolerated.     Follow Up Recommendations DC plan and follow up therapy as arranged by surgeon (OP)    Equipment Recommendations  None recommended by PT    Recommendations for Other Services       Precautions / Restrictions Precautions Precautions: Fall;Knee Restrictions Weight Bearing Restrictions: No RLE Weight Bearing: Weight bearing as tolerated      Mobility  Bed Mobility Overal bed mobility: Needs Assistance Bed Mobility: Supine to Sit     Supine to sit: Min guard;HOB elevated     General bed mobility comments: close guard for safety  Transfers Overall transfer level: Needs assistance Equipment used: Rolling walker (2 wheeled) Transfers: Sit to/from Stand Sit to Stand: Min assist         General transfer comment: Assist to rise, stabilize, control descent. VCs safety, technique, hand/LE placement.   Ambulation/Gait Ambulation/Gait assistance: Min assist Ambulation Distance (Feet): 25 Feet Assistive device: Rolling walker (2 wheeled) Gait Pattern/deviations: Step-to pattern;Antalgic     General Gait Details: VCs safety, technique, sequence, step length. Slow gait speed.   Stairs            Wheelchair Mobility    Modified Rankin (Stroke Patients Only)       Balance                                             Pertinent Vitals/Pain Pain Assessment: 0-10 Pain Score: 3  Pain Location: R knee Pain Descriptors / Indicators: Aching;Sore Pain Intervention(s): Monitored during session;Ice applied    Home Living Family/patient expects to be  discharged to:: Private residence Living Arrangements: Spouse/significant other Available Help at Discharge: Family Type of Home: House Home Access: Level entry     Home Layout: [place]: Grab bars - tub/shower;Grab bars - toilet;Walker - 2 wheels;Cane - single point      Prior Function                 Hand Dominance        Extremity/Trunk Assessment   Upper Extremity Assessment Upper Extremity Assessment: Defer to OT evaluation    Lower Extremity Assessment Lower Extremity Assessment: Generalized weakness (s/p R TKA)    Cervical / Trunk Assessment Cervical / Trunk Assessment: Normal  Communication      Cognition Arousal/Alertness: Awake/alert Behavior During Therapy: WFL for tasks assessed/performed Overall Cognitive Status: Within Functional Limits for tasks assessed                                        General Comments      Exercises     Assessment/Plan    PT Assessment Patient needs continued PT services  PT Problem List Decreased strength;Decreased mobility;Decreased range of motion;Decreased activity tolerance;Decreased balance;Decreased knowledge of use of DME;Pain       PT Treatment Interventions DME instruction;Therapeutic activities;Gait training;Therapeutic exercise;Patient/family education;Functional mobility training;Balance training  PT Goals (Current goals can be found in the Care Plan section)  Acute Rehab PT Goals Patient Stated Goal: regain independence PT Goal Formulation: With patient Time For Goal Achievement: 01/12/17 Potential to Achieve Goals: Good    Frequency 7X/week   Barriers to discharge        Co-evaluation               AM-PAC PT "6 Clicks" Daily Activity  Outcome Measure Difficulty turning over in bed (including adjusting bedclothes, sheets and blankets)?: A Little Difficulty moving from lying on back to sitting on the side of the bed? : A Little Difficulty sitting  down on and standing up from a chair with arms (e.g., wheelchair, bedside commode, etc,.)?: Unable Help needed moving to and from a bed to chair (including a wheelchair)?: A Little Help needed walking in hospital room?: A Little Help needed climbing 3-5 steps with a railing? : A Little 6 Click Score: 16    End of Session Equipment Utilized During Treatment: Gait belt Activity Tolerance: Patient tolerated treatment well Patient left: in chair;with call bell/phone within reach;with family/visitor present   PT Visit Diagnosis: Muscle weakness (generalized) (M62.81);Difficulty in walking, not elsewhere classified (R26.2)    Time: 4562-5638 PT Time Calculation (min) (ACUTE ONLY): 23 min   Charges:   PT Evaluation $PT Eval Low Complexity: 1 Low PT Treatments $Gait Training: 8-22 mins   PT G Codes:          Weston Anna, MPT Pager: [phone]

## 2016-12-29 NOTE — Progress Notes (Signed)
AssistedDr. Carswell Jackson with right, ultrasound guided, adductor canal block. Side rails up, monitors on throughout procedure. See vital signs in flow sheet. Tolerated Procedure well.  

## 2016-12-29 NOTE — Anesthesia Preprocedure Evaluation (Addendum)
Anesthesia Evaluation  Patient identified by MRN, date of birth, ID band Patient awake    Reviewed: Allergy & Precautions, NPO status , Patient's Chart, lab work & pertinent test results  History of Anesthesia Complications Negative for: history of anesthetic complications  Airway Mallampati: II  TM Distance: >3 FB Neck ROM: Full    Dental  (+) Dental Advisory Given   Pulmonary neg pulmonary ROS,    breath sounds clear to auscultation       Cardiovascular hypertension, Pt. on medications (-) angina Rhythm:Regular Rate:Normal     Neuro/Psych  Headaches, Chronic back pain    GI/Hepatic Neg liver ROS, GERD  Controlled,  Endo/Other  Hypothyroidism   Renal/GU negative Renal ROS     Musculoskeletal  (+) Arthritis , Osteoarthritis,    Abdominal   Peds  Hematology negative hematology ROS (+)   Anesthesia Other Findings   Reproductive/Obstetrics                            Anesthesia Physical Anesthesia Plan  ASA: II  Anesthesia Plan: Spinal   Post-op Pain Management:  Regional for Post-op pain   Induction:   PONV Risk Score and Plan: 3 and Ondansetron, Dexamethasone and Midazolam  Airway Management Planned: Natural Airway and Simple Face Mask  Additional Equipment:   Intra-op Plan:   Post-operative Plan:   Informed Consent: I have reviewed the patients History and Physical, chart, labs and discussed the procedure including the risks, benefits and alternatives for the proposed anesthesia with the patient or authorized representative who has indicated his/her understanding and acceptance.   Dental advisory given  Plan Discussed with: CRNA and Surgeon  Anesthesia Plan Comments: (Plan routine monitors, SAB with adductor canal block for post op analgesia Pt's chart relates isoflurane allergy, pt does not know what this is, says someone told her she threw up with her last surgery from  isoflurane, yet she did not receive isoflurane during that back fusion operation)        Anesthesia Quick Evaluation

## 2016-12-29 NOTE — Discharge Instructions (Signed)

## 2016-12-29 NOTE — Anesthesia Procedure Notes (Signed)
Anesthesia Regional Block: Adductor canal block   Pre-Anesthetic Checklist: ,, timeout performed, Correct Patient, Correct Site, Correct Laterality, Correct Procedure, Correct Position, site marked, Risks and benefits discussed,  Surgical consent,  Pre-op evaluation,  At surgeon's request and post-op pain management  Laterality: Right and Lower  Prep: chloraprep       Needles:  Injection technique: Single-shot  Needle Type: Echogenic Needle     Needle Length: 9cm  Needle Gauge: 21     Additional Needles:   Procedures: ultrasound guided,,,,,,,,  Narrative:  Start time: 12/29/2016 8:15 AM End time: 12/29/2016 8:22 AM Injection made incrementally with aspirations every 5 mL.  Performed by: Personally  Anesthesiologist: Glennon Mac, Trinh Sanjose  Additional Notes: Pt identified in Holding room.  Monitors applied. Working IV access confirmed. Sterile prep, drape R thigh.  #21 ga ECHOgenic needle into adductor canal with US guidance.  20cc 0.5% Ropivacaine injected incrementally after negative test dose, good spread in adductor canal.  Patient asymptomatic, VSS, no heme aspirated, tolerated well.  Jenita Seashore, MD

## 2016-12-29 NOTE — Interval H&P Note (Signed)
History and Physical Interval Note:  12/29/2016 7:13 AM  Margaret Guerrero  has presented today for surgery, with the diagnosis of Right knee osteoarthritis  The various methods of treatment have been discussed with the patient and family. After consideration of risks, benefits and other options for treatment, the patient has consented to  Procedure(s) with comments: RIGHT TOTAL KNEE ARTHROPLASTY (Right) - 70 mins as a surgical intervention .  The patient's history has been reviewed, patient examined, no change in status, stable for surgery.  I have reviewed the patient's chart and labs.  Questions were answered to the patient's satisfaction.     Mauri Pole

## 2016-12-29 NOTE — Op Note (Signed)
NAME:  Margaret Guerrero                      MEDICAL RECORD NO.:  [id number]                             FACILITY:  [facility]      PHYSICIAN:  Pietro Cassis. Alvan Dame, M.D.  DATE OF BIRTH:  1940/12/07      DATE OF PROCEDURE:  12/29/2016                                     OPERATIVE REPORT         PREOPERATIVE DIAGNOSIS:  Right knee osteoarthritis.      POSTOPERATIVE DIAGNOSIS:  Right knee osteoarthritis.      FINDINGS:  The patient was noted to have complete loss of cartilage and   bone-on-bone arthritis with associated osteophytes in the medial and patellofemoral compartments of   the knee with a significant synovitis and associated effusion.      PROCEDURE:  Right total knee replacement.      COMPONENTS USED:  DePuy Attune rotating platform posterior stabilized knee   system, a size 5 femur, 4 tibia, size 6 mm PS AOX insert, and 38 anatomic patellar   button.      SURGEON:  Pietro Cassis. Alvan Dame, M.D.      ASSISTANT:  Nehemiah Massed, PA-C.      ANESTHESIA:  Regional and Spinal.      SPECIMENS:  None.      COMPLICATION:  None.      DRAINS:  None.  EBL: <100cc      TOURNIQUET TIME:   Total Tourniquet Time Documented: Thigh (Right) - 27 minutes Total: Thigh (Right) - 27 minutes      The patient was stable to the recovery room.      INDICATION FOR PROCEDURE:  Margaret Guerrero is a 76 y.o. female patient of   mine.  The patient had been seen, evaluated, and treated conservatively in the   office with medication, activity modification, and injections.  The patient had   radiographic changes of bone-on-bone arthritis with endplate sclerosis and osteophytes noted.      The patient failed conservative measures including medication, injections, and activity modification, and at this point was ready for more definitive measures.   Based on the radiographic changes and failed conservative measures, the patient   decided to proceed with total knee replacement.  Risks of infection,   DVT, component  failure, need for revision surgery, postop course, and   expectations were all   discussed and reviewed.  Consent was obtained for benefit of pain   relief.      PROCEDURE IN DETAIL:  The patient was brought to the operative theater.   Once adequate anesthesia, preoperative antibiotics, 2 gm of Ancef, 1 gm of Tranexamic Acid, and 10 mg of Decadron administered, the patient was positioned supine with the right thigh tourniquet placed.  The  right lower extremity was prepped and draped in sterile fashion.  A time-   out was performed identifying the patient, planned procedure, and   extremity.      The right lower extremity was placed in the [facility] leg holder.  The leg was   exsanguinated, tourniquet elevated to 250 mmHg.  A midline incision was   made  followed by median parapatellar arthrotomy.  Following initial   exposure, attention was first directed to the patella.  Precut   measurement was noted to be 24 mm.  I resected down to 14 mm and used a   38 anatomic patellar button to restore patellar height as well as cover the cut   surface.      The lug holes were drilled and a metal shim was placed to protect the   patella from retractors and saw blades.      At this point, attention was now directed to the femur.  The femoral   canal was opened with a drill, irrigated to try to prevent fat emboli.  An   intramedullary rod was passed at 3 degrees valgus, 9 mm of bone was   resected off the distal femur.  Following this resection, the tibia was   subluxated anteriorly.  Using the extramedullary guide, 2 mm of bone was resected off   the proximal medial tibia.  We confirmed the gap would be   stable medially and laterally with a size 5 spacer gap as well as confirmed   the cut was perpendicular in the coronal plane, checking with an alignment rod.      Once this was done, I sized the femur to be a size 5 in the anterior-   posterior dimension, chose a standard component based on medial  and   lateral dimension.  The size 5 rotation block was then pinned in   position anterior referenced using the C-clamp to set rotation.  The   anterior, posterior, and  chamfer cuts were made without difficulty nor   notching making certain that I was along the anterior cortex to help   with flexion gap stability.      The final box cut was made off the lateral aspect of distal femur.      At this point, the tibia was sized to be a size 4, the size 4 tray was   then pinned in position through the medial third of the tubercle,   drilled, and keel punched.  Trial reduction was now carried with a 5 femur,  4 tibia, a size 6 mm PS insert, and the 38 anatomic patella botton.  The knee was brought to   extension, full extension with good flexion stability with the patella   tracking through the trochlea without application of pressure.  Given   all these findings the femoral lug holes were drilled and then the trial components removed.  Final components were   opened and cement was mixed.  The knee was irrigated with normal saline   solution and pulse lavage.  The synovial lining was   then injected with 30 cc of 0.25% Marcaine with epinephrine and 1 cc of Toradol plus 30 cc of NS for a    total of 61 cc.      The knee was irrigated.  Final implants were then cemented onto clean and   dried cut surfaces of bone with the knee brought to extension with a size 6 mm PS trial insert.      Once the cement had fully cured, the excess cement was removed   throughout the knee.  I confirmed I was satisfied with the range of   motion and stability, and the final size 6 mm PS AOX insert was chosen.  It was   placed into the knee.      The tourniquet had been let  down at 27 minutes.  No significant   hemostasis required.  The   extensor mechanism was then reapproximated using #1 Vicryl and #0 Stratafix sutures with the knee   in flexion.  The   remaining wound was closed with 2-0 Vicryl and running  4-0 Monocryl.   The knee was cleaned, dried, dressed sterilely using Dermabond and   Aquacel dressing.  The patient was then   brought to recovery room in stable condition, tolerating the procedure   well.   Please note that Physician Assistant, Nehemiah Massed, PA-C, was present for the entirety of the case, and was utilized for pre-operative positioning, peri-operative retractor management, general facilitation of the procedure.  He was also utilized for primary wound closure at the end of the case.              Pietro Cassis Alvan Dame, M.D.    12/29/2016 9:56 AM

## 2016-12-29 NOTE — Progress Notes (Signed)
   12/29/16 1744  PT Time Calculation  PT Start Time (ACUTE ONLY) 1621  PT Stop Time (ACUTE ONLY) 1644  PT Time Calculation (min) (ACUTE ONLY) 23 min  PT G-Codes **NOT FOR INPATIENT CLASS**  Functional Assessment Tool Used AM-PAC 6 Clicks Basic Mobility;Clinical judgement  Functional Limitation Mobility: Walking and moving around  Mobility: Walking and Moving Around Current Status (L2440) CJ  Mobility: Walking and Moving Around Goal Status (N0272) CI  PT General Charges  $$ ACUTE PT VISIT 1 Visit  PT Evaluation  $PT Eval Low Complexity 1 Low  PT Treatments  $Gait Training 8-22 mins   Weston Anna, MPT [phone]

## 2016-12-29 NOTE — Anesthesia Procedure Notes (Signed)
Spinal  Patient location during procedure: OR Start time: 12/29/2016 8:38 AM End time: 12/29/2016 8:45 AM Reason for block: at surgeon's request Staffing Resident/CRNA: Serayah Fu Performed: resident/CRNA  Preanesthetic Checklist Completed: patient identified, site marked, surgical consent, pre-op evaluation, timeout performed, IV checked, risks and benefits discussed and monitors and equipment checked Spinal Block Patient position: sitting Prep: DuraPrep Patient monitoring: heart rate, continuous pulse ox and blood pressure Approach: midline Location: L2-3 Injection technique: single-shot Needle Needle type: Pencan  Needle gauge: 24 G Needle length: 9 cm Assessment Sensory level: T6 Additional Notes Expiration date of kit checked and confirmed. Patient tolerated procedure well, without complications. X 1 attempt with noted clear CSF return. Loss of motor and sensory on exam post injection.

## 2016-12-30 DIAGNOSIS — Z881 Allergy status to other antibiotic agents status: Secondary | ICD-10-CM | POA: Diagnosis not present

## 2016-12-30 DIAGNOSIS — Z96651 Presence of right artificial knee joint: Secondary | ICD-10-CM

## 2016-12-30 DIAGNOSIS — Z883 Allergy status to other anti-infective agents status: Secondary | ICD-10-CM | POA: Diagnosis not present

## 2016-12-30 DIAGNOSIS — Z6825 Body mass index (BMI) 25.0-25.9, adult: Secondary | ICD-10-CM | POA: Diagnosis not present

## 2016-12-30 DIAGNOSIS — Z880 Allergy status to penicillin: Secondary | ICD-10-CM | POA: Diagnosis not present

## 2016-12-30 DIAGNOSIS — M1711 Unilateral primary osteoarthritis, right knee: Secondary | ICD-10-CM | POA: Diagnosis present

## 2016-12-30 DIAGNOSIS — E039 Hypothyroidism, unspecified: Secondary | ICD-10-CM | POA: Diagnosis present

## 2016-12-30 DIAGNOSIS — K219 Gastro-esophageal reflux disease without esophagitis: Secondary | ICD-10-CM | POA: Diagnosis present

## 2016-12-30 DIAGNOSIS — Z981 Arthrodesis status: Secondary | ICD-10-CM | POA: Diagnosis not present

## 2016-12-30 DIAGNOSIS — E663 Overweight: Secondary | ICD-10-CM | POA: Diagnosis present

## 2016-12-30 DIAGNOSIS — I10 Essential (primary) hypertension: Secondary | ICD-10-CM | POA: Diagnosis present

## 2016-12-30 LAB — CBC
HCT: 31.5 % — ABNORMAL LOW (ref 36.0–46.0)
Hemoglobin: 10.8 g/dL — ABNORMAL LOW (ref 12.0–15.0)
MCH: 28.6 pg (ref 26.0–34.0)
MCHC: 34.3 g/dL (ref 30.0–36.0)
MCV: 83.6 fL (ref 78.0–100.0)
PLATELETS: 230 10*3/uL (ref 150–400)
RBC: 3.77 MIL/uL — ABNORMAL LOW (ref 3.87–5.11)
RDW: 13.6 % (ref 11.5–15.5)
WBC: 11.6 10*3/uL — AB (ref 4.0–10.5)

## 2016-12-30 LAB — BASIC METABOLIC PANEL
ANION GAP: 6 (ref 5–15)
BUN: 14 mg/dL (ref 6–20)
CO2: 26 mmol/L (ref 22–32)
CREATININE: 0.87 mg/dL (ref 0.44–1.00)
Calcium: 8.5 mg/dL — ABNORMAL LOW (ref 8.9–10.3)
Chloride: 102 mmol/L (ref 101–111)
GFR calc Af Amer: >60 mL/min (ref >60)
GLUCOSE: 109 mg/dL — AB (ref 65–99)
Potassium: 3.8 mmol/L (ref 3.5–5.1)
Sodium: 134 mmol/L — ABNORMAL LOW (ref 135–145)

## 2016-12-30 MED ORDER — CLINDAMYCIN HCL 300 MG PO CAPS
300.0000 mg | ORAL_CAPSULE | Freq: Three times a day (TID) | ORAL | Status: DC
  Filled 2016-12-30 (×4): qty 1

## 2016-12-30 NOTE — Care Management Note (Signed)
Case Management Note  Patient Details  Name: Margaret Guerrero MRN: [id number] Date of Birth: May 30, 1940  Subjective/Objective: 76 y/o f admitted w/R TKA. POD#1. From home. Has rw,3n1,cane. PT-otpt PT;OT-no needs. D/c home w/opt PT per [place] [facility]. No further CM needs.                   Action/Plan:d/c plan home-otpt PT.   Expected Discharge Date:  12/30/16               Expected Discharge Plan:  OP Rehab  In-House Referral:     Discharge planning Services  CM Consult  Post Acute Care Choice:  Durable Medical Equipment (has rw,3n1,cane) Choice offered to:     DME Arranged:    DME Agency:     HH Arranged:    HH Agency:     Status of Service:  Completed, signed off  If discussed at [employer], dates discussed:    Additional Comments:  Dessa Phi, RN 12/30/2016, 11:52 AM

## 2016-12-30 NOTE — Progress Notes (Signed)
Physical Therapy Treatment Patient Details Name: Margaret Guerrero MRN: [id number] DOB: Oct 08, 1940 Today's Date: 12/30/2016    History of Present Illness 76 yo female s/p R TKA 12/29/16. Hx of lumbar fusion 2016.     PT Comments    Pt tolerated session well this morning. Will see patient again this afternoon .   Follow Up Recommendations  DC plan and follow up therapy as arranged by surgeon (OP)     Equipment Recommendations  None recommended by PT    Recommendations for Other Services       Precautions / Restrictions Precautions Precautions: Fall;Knee Precaution Comments: verbally reviewed knee precautions. Continued to answer how to avoid sleepign with a pillow under her knees for she does so currently due to low back pain. WE reviewed alternative options Restrictions Weight Bearing Restrictions: No RLE Weight Bearing: Weight bearing as tolerated    Mobility  Bed Mobility Overal bed mobility: Needs Assistance Bed Mobility: Supine to Sit     Supine to sit: Min guard;HOB elevated     General bed mobility comments: close guard for safety  Transfers Overall transfer level: Needs assistance Equipment used: Rolling walker (2 wheeled) Transfers: Sit to/from Stand Sit to Stand: Min guard         General transfer comment: Assist to rise and stabilize. VCs for hand/LE placement.   Ambulation/Gait Ambulation/Gait assistance: Min guard Ambulation Distance (Feet): 45 Feet Assistive device: Rolling walker (2 wheeled) Gait Pattern/deviations: Step-to pattern;Antalgic     General Gait Details: VCs safety, technique, sequence, step length. Slow gait speed.    Stairs            Wheelchair Mobility    Modified Rankin (Stroke Patients Only)       Balance                                            Cognition Arousal/Alertness: Awake/alert Behavior During Therapy: WFL for tasks assessed/performed Overall Cognitive Status: Within Functional  Limits for tasks assessed                                        Exercises Total Joint Exercises Ankle Circles/Pumps: AROM;Both;10 reps;Seated Quad Sets: AROM;Right;10 reps;Seated Heel Slides: AAROM;Right;10 reps;Seated (in recliner with leg rest elevated ) Straight Leg Raises: AAROM;Right;10 reps;Supine Knee Flexion: AAROM (seated in recliner dangling with slight pressure with other leg for assisted knee stretch ) Goniometric ROM: 0-80... seated     General Comments        Pertinent Vitals/Pain Pain Assessment: 0-10 Pain Score: 3  (staeted at 0-10 at rest, however after exercises and walkiing a little more pain was present ) Faces Pain Scale: Hurts a little bit Pain Location: R knee Pain Descriptors / Indicators: Aching;Sore Pain Intervention(s): Premedicated before session;Ice applied;Monitored during session    [place] expects to be discharged to:: Private residence Living Arrangements: Spouse/significant other Available Help at Discharge: Family;Available 24 hours/day Type of Home: House Home Access: Level entry   Home Layout: One level Home Equipment: Grab bars - tub/shower;Grab bars - toilet;Walker - 2 wheels;Cane - single point      Prior Function Level of Independence: Independent          PT Goals (current goals can now be found in the care plan  section) Acute Rehab PT Goals Patient Stated Goal: regain independence; get back to using sewing machine  PT Goal Formulation: With patient Time For Goal Achievement: 01/12/17 Potential to Achieve Goals: Good Progress towards PT goals: Progressing toward goals    Frequency    7X/week      PT Plan Current plan remains appropriate    Co-evaluation              AM-PAC PT "6 Clicks" Daily Activity  Outcome Measure  Difficulty turning over in bed (including adjusting bedclothes, sheets and blankets)?: A Little Difficulty moving from lying on back to sitting on the  side of the bed? : A Little Difficulty sitting down on and standing up from a chair with arms (e.g., wheelchair, bedside commode, etc,.)?: Unable Help needed moving to and from a bed to chair (including a wheelchair)?: A Little Help needed walking in hospital room?: A Little Help needed climbing 3-5 steps with a railing? : A Little 6 Click Score: 16    End of Session Equipment Utilized During Treatment: Gait belt Activity Tolerance: Patient tolerated treatment well Patient left: in chair;with call bell/phone within reach Nurse Communication: Mobility status PT Visit Diagnosis: Muscle weakness (generalized) (M62.81);Difficulty in walking, not elsewhere classified (R26.2)     Time: 7829-5621 PT Time Calculation (min) (ACUTE ONLY): 25 min  Charges:  $Gait Training: 8-22 mins $Therapeutic Exercise: 8-22 mins                    G Codes:       Margaret Guerrero, PT Pager: [phone] 12/30/2016    Alexandr Yaworski, Gatha Mayer 12/30/2016, 1:01 PM

## 2016-12-30 NOTE — Evaluation (Signed)
Occupational Therapy Evaluation Patient Details Name: Margaret Guerrero MRN: [id number] DOB: [birth year] Today's Date: 12/30/2016    History of Present Illness 76 yo female s/p R TKA 12/29/16. Hx of lumbar fusion 2016.    Clinical Impression   This 76 y/o F presents with the above. Pt lives with spouse, at baseline was independent with ADLs and functional mobility. Pt currently requires MinGuard for functional mobility at RW level, MinA for LB ADLs with education on use of AE provided. Will continue to follow acutely to maximize Pt's safety and independence with ADLs and functional mobility prior to return home.     Follow Up Recommendations  No OT follow up;Supervision/Assistance - 24 hour    Equipment Recommendations  None recommended by OT;Other (comment) (Pt plans to borrow 3:1)           Precautions / Restrictions Precautions Precautions: Fall;Knee Precaution Comments: verbally reviewed knee precautions  Restrictions Weight Bearing Restrictions: No RLE Weight Bearing: Weight bearing as tolerated      Mobility Bed Mobility Overal bed mobility: Needs Assistance Bed Mobility: Supine to Sit     Supine to sit: Min guard;HOB elevated     General bed mobility comments: close guard for safety  Transfers Overall transfer level: Needs assistance Equipment used: Rolling walker (2 wheeled) Transfers: Sit to/from Stand Sit to Stand: Min assist         General transfer comment: Assist to rise and stabilize. VCs for hand/LE placement.                                                ADL either performed or assessed with clinical judgement   ADL Overall ADL's : Needs assistance/impaired Eating/Feeding: Set up;Sitting   Grooming: Wash/dry hands;Min guard;Standing   Upper Body Bathing: Min guard;Sitting   Lower Body Bathing: Minimal assistance;Sit to/from stand   Upper Body Dressing : Min guard;Sitting   Lower Body Dressing: Minimal assistance;Sit  to/from stand Lower Body Dressing Details (indicate cue type and reason): educated on use of AE for completing LB dressing, Pt has reacher and sock aide at home  Toilet Transfer: Min guard;Ambulation;BSC;RW Toilet Transfer Details (indicate cue type and reason): BSC over toilet  Toileting- Clothing Manipulation and Hygiene: Min guard;Sit to/from stand   Tub/ Shower Transfer: Minimal assistance;Walk-in shower;Ambulation;3 in 1;Rolling walker;Grab bars   Functional mobility during ADLs: Min guard;Rolling walker General ADL Comments: Educated Pt on compensatory techniques, DME and AE for completing ADLs                          Pertinent Vitals/Pain Pain Assessment: Faces Faces Pain Scale: Hurts a little bit Pain Location: R knee Pain Descriptors / Indicators: Aching;Sore Pain Intervention(s): Monitored during session;Repositioned;Ice applied          Extremity/Trunk Assessment Upper Extremity Assessment Upper Extremity Assessment: Overall WFL for tasks assessed   Lower Extremity Assessment Lower Extremity Assessment: Defer to PT evaluation   Cervical / Trunk Assessment Cervical / Trunk Assessment: Normal   Communication Communication Communication: No difficulties   Cognition Arousal/Alertness: Awake/alert Behavior During Therapy: WFL for tasks assessed/performed Overall Cognitive Status: Within Functional Limits for tasks assessed  Home Living Family/patient expects to be discharged to:: Private residence Living Arrangements: Spouse/significant other Available Help at Discharge: Family;Available 24 hours/day Type of Home: House Home Access: Level entry     Home Layout: One level     Bathroom Shower/Tub: Occupational psychologist: Handicapped height     Home Equipment: Grab bars - tub/shower;Grab bars - toilet;Walker - 2 wheels;Cane - single point          Prior  Functioning/Environment Level of Independence: Independent                 OT Problem List: Decreased strength;Decreased activity tolerance;Decreased knowledge of use of DME or AE;Decreased knowledge of precautions      OT Treatment/Interventions: Self-care/ADL training;DME and/or AE instruction;Therapeutic activities;Balance training;Therapeutic exercise;Patient/family education    OT Goals(Current goals can be found in the care plan section) Acute Rehab OT Goals Patient Stated Goal: regain independence; get back to using sewing machine  OT Goal Formulation: With patient Time For Goal Achievement: 01/13/17 Potential to Achieve Goals: Good  OT Frequency: Min 2X/week                             AM-PAC PT "6 Clicks" Daily Activity     Outcome Measure Help from another person eating meals?: None Help from another person taking care of personal grooming?: A Little Help from another person toileting, which includes using toliet, bedpan, or urinal?: A Little Help from another person bathing (including washing, rinsing, drying)?: A Little Help from another person to put on and taking off regular upper body clothing?: A Little Help from another person to put on and taking off regular lower body clothing?: A Lot 6 Click Score: 18   End of Session Equipment Utilized During Treatment: Gait belt;Rolling walker Nurse Communication: Mobility status  Activity Tolerance: Patient tolerated treatment well Patient left: in chair;with call bell/phone within reach  OT Visit Diagnosis: Unsteadiness on feet (R26.81)                Time: 0820-0900 OT Time Calculation (min): 40 min Charges:  OT General Charges $OT Visit: 1 Visit OT Evaluation $OT Eval Low Complexity: 1 Low OT Treatments $Self Care/Home Management : 8-22 mins G-Codes:     Lou Cal, OT Pager [phone] 12/30/2016   Raymondo Band 12/30/2016, 10:24 AM

## 2016-12-30 NOTE — Progress Notes (Signed)
     Subjective: 1 Day Post-Op Procedure(s) (LRB): RIGHT TOTAL KNEE ARTHROPLASTY (Right)   Patient reports pain as moderate, mild pain with laying flat, increased pain with sitting up.  No events throughout the night.  Plan for discharge possibly tomorrow due to pain control and need for inpatient therapy to meet goal of being discharged home safely with family/caregiver.  Objective:   VITALS:   Vitals:   12/30/16 0217 12/30/16 0531  BP: (!) 142/75 (!) 144/73  Pulse: 75 72  Resp: 16 16  Temp: 98.2 F (36.8 C) 98 F (36.7 C)  SpO2: 98% 98%    Dorsiflexion/Plantar flexion intact Incision: dressing C/D/I No cellulitis present Compartment soft  LABS  Recent Labs  12/30/16 0447  HGB 10.8*  HCT 31.5*  WBC 11.6*  PLT 230     Recent Labs  12/30/16 0447  NA 134*  K 3.8  BUN 14  CREATININE 0.87  GLUCOSE 109*     Assessment/Plan: 1 Day Post-Op Procedure(s) (LRB): RIGHT TOTAL KNEE ARTHROPLASTY (Right) Foley cath d/c'ed Advance diet Up with therapy D/C IV fluids Discharge home Follow up in 2 weeks at [facility]. Follow up with OLIN,Quill Grinder D in 2 weeks.  Contact information:  [facility] [address], [place] [phone]    Overweight (BMI 25-29.9) Estimated body mass index is 26.19 kg/m as calculated from the following:   Height as of this encounter: 5' 4.5" (1.638 m).   Weight as of this encounter: 70.3 kg (155 lb). Patient also counseled that weight may inhibit the healing process Patient counseled that losing weight will help with future health issues        [place] Pugh. Icey Tello   PAC  12/30/2016, 8:21 AM

## 2016-12-30 NOTE — Addendum Note (Signed)
Addendum  created 12/30/16 0617 by Lollie Sails, CRNA   Charge Capture section accepted

## 2016-12-30 NOTE — Progress Notes (Signed)
Physical Therapy Treatment Patient Details Name: Margaret Guerrero MRN: [id number] DOB: 06/18/1940 Today's Date: 12/30/2016    History of Present Illness 76 yo female s/p R TKA 12/29/16. Hx of lumbar fusion 2016.     PT Comments    Progressing well with mobility.    Follow Up Recommendations  DC plan and follow up therapy as arranged by surgeon (OP)     Equipment Recommendations  None recommended by PT    Recommendations for Other Services       Precautions / Restrictions Precautions Precautions: Fall;Knee Precaution Comments: verbally reviewed knee precautions. Continued to answer how to avoid sleepign with a pillow under her knees for she does so currently due to low back pain. WE reviewed alternative options Restrictions Weight Bearing Restrictions: No RLE Weight Bearing: Weight bearing as tolerated    Mobility  Bed Mobility Overal bed mobility: Needs Assistance Bed Mobility: Supine to Sit;Sit to Supine     Supine to sit: HOB elevated;Supervision Sit to supine: HOB elevated;Supervision   General bed mobility comments: close guard for safety  Transfers Overall transfer level: Needs assistance Equipment used: Rolling walker (2 wheeled) Transfers: Sit to/from Stand Sit to Stand: Supervision         General transfer comment: for safety.   Ambulation/Gait Ambulation/Gait assistance: Min guard Ambulation Distance (Feet): 115 Feet Assistive device: Rolling walker (2 wheeled) Gait Pattern/deviations: Step-to pattern;Step-through pattern;Decreased stride length     General Gait Details: close guard for safety.    Stairs            Wheelchair Mobility    Modified Rankin (Stroke Patients Only)       Balance                                            Cognition Arousal/Alertness: Awake/alert Behavior During Therapy: WFL for tasks assessed/performed Overall Cognitive Status: Within Functional Limits for tasks assessed                                         Exercises Total Joint Exercises    Straight Leg Raises: AROM;Right;10 reps;Supine Long Arc Quad: AROM;Right;10 reps;Seated Knee Flexion: AROM;Right;10 reps;Seated Goniometric ROM: ~5-80 degrees    General Comments        Pertinent Vitals/Pain Pain Assessment: 0-10 Pain Score: 3  Pain Location: R knee Pain Descriptors / Indicators: Aching;Sore Pain Intervention(s): Monitored during session;Repositioned;Ice applied    Home Living                      Prior Function            PT Goals (current goals can now be found in the care plan section) Acute Rehab PT Goals Patient Stated Goal: regain independence; get back to using sewing machine  PT Goal Formulation: With patient Time For Goal Achievement: 01/12/17 Potential to Achieve Goals: Good Progress towards PT goals: Progressing toward goals    Frequency    7X/week      PT Plan Current plan remains appropriate    Co-evaluation              AM-PAC PT "6 Clicks" Daily Activity  Outcome Measure  Difficulty turning over in bed (including adjusting bedclothes, sheets and blankets)?: A Little Difficulty moving  from lying on back to sitting on the side of the bed? : A Little Difficulty sitting down on and standing up from a chair with arms (e.g., wheelchair, bedside commode, etc,.)?: A Little Help needed moving to and from a bed to chair (including a wheelchair)?: A Little Help needed walking in hospital room?: A Little Help needed climbing 3-5 steps with a railing? : A Little 6 Click Score: 18    End of Session Equipment Utilized During Treatment: Gait belt Activity Tolerance: Patient tolerated treatment well Patient left: in bed;with call bell/phone within reach;with family/visitor present Nurse Communication: Mobility status PT Visit Diagnosis: Muscle weakness (generalized) (M62.81);Difficulty in walking, not elsewhere classified (R26.2)     Time:  1443-1540 PT Time Calculation (min) (ACUTE ONLY): 27 min  Charges:  $Gait Training: 8-22 mins $Therapeutic Exercise: 8-22 mins                    G Codes:          Weston Anna, MPT Pager: [phone]

## 2016-12-31 LAB — CBC
HCT: 33 % — ABNORMAL LOW (ref 36.0–46.0)
Hemoglobin: 11.2 g/dL — ABNORMAL LOW (ref 12.0–15.0)
MCH: 28.6 pg (ref 26.0–34.0)
MCHC: 33.9 g/dL (ref 30.0–36.0)
MCV: 84.2 fL (ref 78.0–100.0)
PLATELETS: 239 10*3/uL (ref 150–400)
RBC: 3.92 MIL/uL (ref 3.87–5.11)
RDW: 14.1 % (ref 11.5–15.5)
WBC: 12.9 10*3/uL — AB (ref 4.0–10.5)

## 2016-12-31 LAB — BASIC METABOLIC PANEL
ANION GAP: 7 (ref 5–15)
BUN: 19 mg/dL (ref 6–20)
CALCIUM: 8.7 mg/dL — AB (ref 8.9–10.3)
CO2: 25 mmol/L (ref 22–32)
CREATININE: 0.76 mg/dL (ref 0.44–1.00)
Chloride: 103 mmol/L (ref 101–111)
GLUCOSE: 101 mg/dL — AB (ref 65–99)
Potassium: 3.8 mmol/L (ref 3.5–5.1)
Sodium: 135 mmol/L (ref 135–145)

## 2016-12-31 MED ORDER — ONDANSETRON HCL 4 MG PO TABS: 4.0000 mg | ORAL_TABLET | Freq: Three times a day (TID) | ORAL | 0 refills | Status: DC | PRN

## 2016-12-31 NOTE — Progress Notes (Signed)
Occupational Therapy Treatment Patient Details Name: Margaret Guerrero MRN: [id number] DOB: Nov 30, 1940 Today's Date: 12/31/2016    History of present illness 76 yo female s/p R TKA 12/29/16. Hx of lumbar fusion 2016.    OT comments  All education completed this session  Follow Up Recommendations  No OT follow up;Supervision/Assistance - 24 hour    Equipment Recommendations  None recommended by OT;Other (comment) Pt has borrowed a 3:1 and shower seat   Recommendations for Other Services      Precautions / Restrictions Precautions Precautions: Fall;Knee Precaution Comments: verbally reviewed knee precautions. Restrictions RLE Weight Bearing: Weight bearing as tolerated       Mobility Bed Mobility         Supine to sit: HOB elevated;Supervision Sit to supine: HOB elevated;Supervision      Transfers   Equipment used: Rolling walker (2 wheeled)   Sit to Stand: Supervision         General transfer comment: cues for hand placement    Balance                                           ADL either performed or assessed with clinical judgement   ADL       Grooming: Supervision/safety;Standing               Lower Body Dressing: Supervision/safety;Sit to/from stand (sandals with reacher)   Toilet Transfer: Supervision/safety;Ambulation;BSC;RW   Toileting- Clothing Manipulation and Hygiene: Supervision/safety;Sit to/from stand         General ADL Comments: pt continues to need cues for UE placement with sit to stand.  She wants to do as much as possible herself.  Pt has a Government social research officer and sock aide at home.  She will have a friend assist with ted hose and she doesn't feel her husband can manage.  Demonstrated shower transfer. Pt reports she practiced yesterday and did not want to perform again today due to increased pain     Vision       Perception     Praxis      Cognition Arousal/Alertness: Awake/alert Behavior During  Therapy: WFL for tasks assessed/performed Overall Cognitive Status: Within Functional Limits for tasks assessed                                          Exercises     Shoulder Instructions       General Comments      Pertinent Vitals/ Pain       Pain Score: 6  Pain Location: R knee Pain Descriptors / Indicators: Aching;Sore Pain Intervention(s): Limited activity within patient's tolerance;Monitored during session;Premedicated before session;Repositioned;Ice applied  Home Living                                          Prior Functioning/Environment              Frequency           Progress Toward Goals  OT Goals(current goals can now be found in the care plan section)  Progress towards OT goals: Progressing toward goals (no further OT is needed)     Plan  Co-evaluation                 AM-PAC PT "6 Clicks" Daily Activity     Outcome Measure   Help from another person eating meals?: None Help from another person taking care of personal grooming?: A Little Help from another person toileting, which includes using toliet, bedpan, or urinal?: A Little Help from another person bathing (including washing, rinsing, drying)?: A Little Help from another person to put on and taking off regular upper body clothing?: A Little Help from another person to put on and taking off regular lower body clothing?: A Little 6 Click Score: 19    End of Session    OT Visit Diagnosis: Pain Pain - Right/Left: Right Pain - part of body: Knee   Activity Tolerance Patient tolerated treatment well   Patient Left in bed;with call bell/phone within reach   Nurse Communication          Time: 8088-1103 OT Time Calculation (min): 36 min  Charges: OT General Charges $OT Visit: 1 Visit OT Treatments $Self Care/Home Management : 23-37 mins  Lesle Chris, OTR/L [phone] 12/31/2016   [place] 12/31/2016, 8:56  AM

## 2016-12-31 NOTE — Progress Notes (Signed)
     Subjective: 2 Days Post-Op Procedure(s) (LRB): RIGHT TOTAL KNEE ARTHROPLASTY (Right)   Patient reports pain as mild, pain controlled.  No events throughout the night. Little nausea this morning, but otherwise doing well. Ready to be discharged home.   Objective:   VITALS:   Vitals:   12/30/16 1410 12/30/16 2200  BP: (!) 146/73 (!) 166/73  Pulse: 72 80  Resp: 18 20  Temp: 98.4 F (36.9 C) 98.6 F (37 C)  SpO2: 100% 100%    Dorsiflexion/Plantar flexion intact Incision: dressing C/D/I No cellulitis present Compartment soft  LABS  Recent Labs  12/30/16 0447 12/31/16 0600  HGB 10.8* 11.2*  HCT 31.5* 33.0*  WBC 11.6* 12.9*  PLT 230 239     Recent Labs  12/30/16 0447 12/31/16 0600  NA 134* 135  K 3.8 3.8  BUN 14 19  CREATININE 0.87 0.76  GLUCOSE 109* 101*     Assessment/Plan: 2 Days Post-Op Procedure(s) (LRB): RIGHT TOTAL KNEE ARTHROPLASTY (Right)  Up with therapy Discharge home Follow up in 2 weeks at [facility]. Follow up with OLIN,Deneane Stifter D in 2 weeks.  Contact information:  [facility] [address], [address] [place]   PAC  12/31/2016, 7:25 AM

## 2016-12-31 NOTE — Progress Notes (Signed)
Physical Therapy Treatment Patient Details Name: Margaret Guerrero MRN: [id number] DOB: 1941-02-11 Today's Date: 12/31/2016    History of Present Illness 76 yo female s/p R TKA 12/29/16. Hx of lumbar fusion 2016.     PT Comments    Progressing well with mobility. Pt c/o some nausea today. Reviewed/practiced exercises and gait training. Issued HEP for pt to perform 2x/day until she begins OPPT. All education completed. Ready to d/c from PT standpoint-RN made aware.    Follow Up Recommendations  DC plan and follow up therapy as arranged by surgeon (OP)     Equipment Recommendations  None recommended by PT    Recommendations for Other Services       Precautions / Restrictions Precautions Precautions: Knee;Fall Restrictions Weight Bearing Restrictions: No RLE Weight Bearing: Weight bearing as tolerated    Mobility  Bed Mobility Overal bed mobility: Needs Assistance Bed Mobility: Supine to Sit;Sit to Supine     Supine to sit: Supervision Sit to supine: Supervision      Transfers Overall transfer level: Needs assistance Equipment used: Rolling walker (2 wheeled) Transfers: Sit to/from Stand Sit to Stand: Supervision         General transfer comment: cues for hand placement  Ambulation/Gait Ambulation/Gait assistance: Supervision Ambulation Distance (Feet): 145 Feet Assistive device: Rolling walker (2 wheeled) Gait Pattern/deviations: Step-to pattern;Step-through pattern;Decreased stride length     General Gait Details: slow gait speed. Pt c/o mild nausea.   Stairs            Wheelchair Mobility    Modified Rankin (Stroke Patients Only)       Balance                                            Cognition Arousal/Alertness: Awake/alert Behavior During Therapy: WFL for tasks assessed/performed Overall Cognitive Status: Within Functional Limits for tasks assessed                                        Exercises Total  Joint Exercises Ankle Circles/Pumps: AROM;Both;15 reps;Supine Quad Sets: AROM;Both;10 reps;Supine Hip ABduction/ADduction: AAROM;Right;10 reps;Supine Straight Leg Raises: AROM;Right;10 reps;Supine Knee Flexion: AROM;Right;10 reps;Seated Goniometric ROM: ~5-85 degrees    General Comments        Pertinent Vitals/Pain Pain Assessment: 0-10 Pain Score: 4  Pain Location: R knee Pain Descriptors / Indicators: Aching;Sore Pain Intervention(s): Monitored during session;Repositioned;Ice applied    Home Living                      Prior Function            PT Goals (current goals can now be found in the care plan section) Progress towards PT goals: Progressing toward goals    Frequency    7X/week      PT Plan Current plan remains appropriate    Co-evaluation              AM-PAC PT "6 Clicks" Daily Activity  Outcome Measure  Difficulty turning over in bed (including adjusting bedclothes, sheets and blankets)?: A Little Difficulty moving from lying on back to sitting on the side of the bed? : A Little Difficulty sitting down on and standing up from a chair with arms (e.g., wheelchair, bedside commode, etc,.)?: A Little  Help needed moving to and from a bed to chair (including a wheelchair)?: A Little Help needed walking in hospital room?: A Little Help needed climbing 3-5 steps with a railing? : A Little 6 Click Score: 18    End of Session   Activity Tolerance: Patient tolerated treatment well Patient left: in bed;with call bell/phone within reach   PT Visit Diagnosis: Muscle weakness (generalized) (M62.81);Difficulty in walking, not elsewhere classified (R26.2)     Time: 8937-3428 PT Time Calculation (min) (ACUTE ONLY): 28 min  Charges:  $Gait Training: 8-22 mins $Therapeutic Exercise: 8-22 mins                    G Codes:         Weston Anna, MPT Pager: [phone]

## 2017-01-04 DIAGNOSIS — M1711 Unilateral primary osteoarthritis, right knee: Secondary | ICD-10-CM | POA: Diagnosis not present

## 2017-01-04 NOTE — Discharge Summary (Signed)
Physician Discharge Summary  Patient ID: Margaret Guerrero MRN: [id number] DOB/AGE: [birth year] 76 y.o.  Admit date: 12/29/2016 Discharge date: 12/31/2016   Procedures:  Procedure(s) (LRB): RIGHT TOTAL KNEE ARTHROPLASTY (Right)  Attending Physician:  Dr. Paralee Cancel   Admission Diagnoses:   Right knee primary OA / pain  Discharge Diagnoses:  Principal Problem:   S/P right TKA Active Problems:   Overweight (BMI 25.0-29.9)  Past Medical History:  Diagnosis Date  . Acute appendicitis s/p lap appy 08/21/2012 08/21/2012   Diagnosis Appendix, Other than Incidental - TRANSMURAL ACUTE APPENDICITIS AND PERIAPPENDICITIS.   Marland Kitchen Ankle fracture   . Arthritis   . Cancer ([place])    skin cancers removed  . History of hiatal hernia   . Hypercholesteremia   . Hypertension   . Hypothyroidism   . Migraine    occasionally  . Pre-diabetes     HPI:    Margaret Guerrero, 76 y.o. female, has a history of pain and functional disability in the right knee due to arthritis and has failed non-surgical conservative treatments for greater than 12 weeks to include NSAID's and/or analgesics, corticosteriod injections, viscosupplementation injections, use of assistive devices and activity modification.  Onset of symptoms was gradual, starting >10 years ago with gradually worsening course since that time. The patient noted prior procedures on the knee to include  arthroscopy on the right knee(s).  Patient currently rates pain in the right knee(s) at 7 out of 10 with activity. Patient has night pain, worsening of pain with activity and weight bearing, pain that interferes with activities of daily living, pain with passive range of motion, crepitus and joint swelling.  Patient has evidence of periarticular osteophytes and joint space narrowing by imaging studies. There is no active infection.  Risks, benefits and expectations were discussed with the patient.  Risks including but not limited to the risk of anesthesia, blood clots,  nerve damage, blood vessel damage, failure of the prosthesis, infection and up to and including death.  Patient understand the risks, benefits and expectations and wishes to proceed with surgery.   PCP: Vernie Shanks, MD   Discharged Condition: good  Hospital Course:  Patient underwent the above stated procedure on 12/29/2016. Patient tolerated the procedure well and brought to the recovery room in good condition and subsequently to the floor.  POD #1 BP: 144/73 ; Pulse: 72 ; Temp: 98 F (36.7 C) ; Resp: 16 Patient reports pain as moderate, mild pain with laying flat, increased pain with sitting up.  No events throughout the night.  Plan for discharge possibly tomorrowdue to pain control and need for inpatient therapy to meet goal of being discharged home safely with family/caregiver. Dorsiflexion/plantar flexion intact, incision: dressing C/D/I, no cellulitis present and compartment soft.   LABS  Basename    HGB     10.8  HCT     31.5   POD #2  BP: 166/73 ; Pulse: 80 ; Temp: 98.6 F (37 C) ; Resp: 20 Patient reports pain as mild, pain controlled.  No events throughout the night. Little nausea this morning, but otherwise doing well. Ready to be discharged home.  Dorsiflexion/plantar flexion intact, incision: dressing C/D/I, no cellulitis present and compartment soft.   LABS  Basename    HGB     11.2  HCT     33.0    Discharge Exam: General appearance: alert, cooperative and no distress Extremities: Homans sign is negative, no sign of DVT, no edema, redness or tenderness  in the calves or thighs and no ulcers, gangrene or trophic changes  Disposition: Home with follow up in 2 weeks   Follow-up Information    Paralee Cancel, MD. Schedule an appointment as soon as possible for a visit in 2 week(s).   Specialty:  Orthopedic Surgery Contact information: [address] [place] [address] [phone]           Discharge Instructions    Call MD / Call 911     Complete by:  As directed    If you experience chest pain or shortness of breath, CALL 911 and be transported to the hospital emergency room.  If you develope a fever above 101 F, pus (white drainage) or increased drainage or redness at the wound, or calf pain, call your surgeon's office.   Change dressing    Complete by:  As directed    Maintain surgical dressing until follow up in the clinic. If the edges start to pull up, may reinforce with tape. If the dressing is no longer working, may remove and cover with gauze and tape, but must keep the area dry and clean.  Call with any questions or concerns.   Constipation Prevention    Complete by:  As directed    Drink plenty of fluids.  Prune juice may be helpful.  You may use a stool softener, such as Colace (over the counter) 100 mg twice a day.  Use MiraLax (over the counter) for constipation as needed.   Diet - low sodium heart healthy    Complete by:  As directed    Discharge instructions    Complete by:  As directed    Maintain surgical dressing until follow up in the clinic. If the edges start to pull up, may reinforce with tape. If the dressing is no longer working, may remove and cover with gauze and tape, but must keep the area dry and clean.  Follow up in 2 weeks at [facility]. Call with any questions or concerns.   Increase activity slowly as tolerated    Complete by:  As directed    Weight bearing as tolerated with assist device (walker, cane, etc) as directed, use it as long as suggested by your surgeon or therapist, typically at least 4-6 weeks.   TED hose    Complete by:  As directed    Use stockings (TED hose) for 2 weeks on both leg(s).  You may remove them at night for sleeping.      Allergies as of 12/31/2016      Reactions   Ciprofloxacin Itching   Neosporin [neomycin-bacitracin Zn-polymyx]    "it causes cellulitis"   Penicillins Itching, Rash   Has patient had a PCN reaction causing immediate rash,  facial/tongue/throat swelling, SOB or lightheadedness with hypotension: No Has patient had a PCN reaction causing severe rash involving mucus membranes or skin necrosis: Unknown Has patient had a PCN reaction that required hospitalization: No Has patient had a PCN reaction occurring within the last 10 years: No If all of the above answers are "NO", then may proceed with Cephalosporin use.      Medication List    STOP taking these medications   cyclobenzaprine 10 MG tablet Commonly known as:  FLEXERIL   fenofibrate 54 MG tablet   ibuprofen 200 MG tablet Commonly known as:  ADVIL,MOTRIN   ondansetron 4 MG disintegrating tablet Commonly known as:  ZOFRAN ODT   oxyCODONE-acetaminophen 5-325 MG tablet Commonly known as:  PERCOCET/ROXICET     TAKE these medications   ALIGN PO Take 1 tablet by mouth daily.   aspirin 81 MG chewable tablet Commonly known as:  ASPIRIN CHILDRENS Chew 1 tablet (81 mg total) by mouth 2 (two) times daily.   CALCIUM 600 + D PO Take 1 tablet by mouth daily.   cholecalciferol 1000 units tablet Commonly known as:  VITAMIN D Take 1,000 Units by mouth daily.   CINNAMON PO Take 1 capsule by mouth daily.   dicyclomine 20 MG tablet Commonly known as:  BENTYL Take 1 tablet (20 mg total) by mouth 2 (two) times daily.   docusate sodium 100 MG capsule Commonly known as:  COLACE Take 1 capsule (100 mg total) by mouth 2 (two) times daily.   ferrous sulfate 325 (65 FE) MG tablet Commonly known as:  FERROUSUL Take 1 tablet (325 mg total) by mouth 3 (three) times daily with meals.   Fish Oil 1000 MG Caps Take 2,000 mg by mouth every evening.   glucosamine-chondroitin 500-400 MG tablet Take 2 tablets by mouth 3 (three) times a week.   HYDROcodone-acetaminophen 7.5-325 MG tablet Commonly known as:  NORCO Take 1-2 tablets by mouth every 4 (four) hours as needed for moderate pain or severe pain. Notes to patient:  Did not take dose at 0945. May take next  dose when needed   levothyroxine 50 MCG tablet Commonly known as:  SYNTHROID, LEVOTHROID Take 50 mcg by mouth daily before breakfast.   lisinopril 10 MG tablet Commonly known as:  PRINIVIL,ZESTRIL Take 10 mg by mouth daily.   MAGNESIUM PO Take 1 tablet by mouth daily.   methocarbamol 500 MG tablet Commonly known as:  ROBAXIN Take 1 tablet (500 mg total) by mouth every 6 (six) hours as needed for muscle spasms.   multivitamin with minerals Tabs tablet Take 1 tablet by mouth daily.   mupirocin ointment 2 % Commonly known as:  BACTROBAN Place 1 application into the nose 3 (three) times daily.   ondansetron 4 MG tablet Commonly known as:  ZOFRAN Take 1 tablet (4 mg total) by mouth every 8 (eight) hours as needed for nausea or vomiting.   OVER THE COUNTER MEDICATION Take 3 tablets by mouth daily. Heal-N-Soothe Anti inflammatory supplement   polyethylene glycol packet Commonly known as:  MIRALAX / GLYCOLAX Take 17 g by mouth 2 (two) times daily.   Potassium 99 MG Tabs Take 99 mg by mouth daily.   TURMERIC PO Take 100 mg by mouth daily.   vitamin C 250 MG tablet Commonly known as:  ASCORBIC ACID Take 250 mg by mouth daily.            Discharge Care Instructions        Start     Ordered   12/31/16 0000  ondansetron (ZOFRAN) 4 MG tablet  Every 8 hours PRN    Question:  Supervising Provider  Answer:  Paralee Cancel   12/31/16 0726   12/30/16 0000  Call MD / Call 911    Comments:  If you experience chest pain or shortness of breath, CALL 911 and be transported to the hospital emergency room.  If you develope a fever above 101 F, pus (white drainage) or increased drainage or redness at the wound, or calf pain, call your surgeon's office.   12/30/16 0819   12/30/16 0000  Discharge instructions    Comments:  Maintain surgical dressing until follow up in the clinic. If the edges start to pull  up, may reinforce with tape. If the dressing is no longer working, may remove  and cover with gauze and tape, but must keep the area dry and clean.  Follow up in 2 weeks at [facility]. Call with any questions or concerns.   12/30/16 0819   12/30/16 0000  Diet - low sodium heart healthy     12/30/16 0819   12/30/16 0000  Constipation Prevention    Comments:  Drink plenty of fluids.  Prune juice may be helpful.  You may use a stool softener, such as Colace (over the counter) 100 mg twice a day.  Use MiraLax (over the counter) for constipation as needed.   12/30/16 0819   12/30/16 0000  Increase activity slowly as tolerated    Comments:  Weight bearing as tolerated with assist device (walker, cane, etc) as directed, use it as long as suggested by your surgeon or therapist, typically at least 4-6 weeks.   12/30/16 0819   12/30/16 0000  TED hose    Comments:  Use stockings (TED hose) for 2 weeks on both leg(s).  You may remove them at night for sleeping.   12/30/16 0819   12/30/16 0000  Change dressing    Comments:  Maintain surgical dressing until follow up in the clinic. If the edges start to pull up, may reinforce with tape. If the dressing is no longer working, may remove and cover with gauze and tape, but must keep the area dry and clean.  Call with any questions or concerns.   12/30/16 0819   12/29/16 0000  aspirin (ASPIRIN CHILDRENS) 81 MG chewable tablet  2 times daily    Question:  Supervising Provider  Answer:  Paralee Cancel   12/29/16 0928   12/29/16 0000  docusate sodium (COLACE) 100 MG capsule  2 times daily    Question:  Supervising Provider  Answer:  Paralee Cancel   12/29/16 0928   12/29/16 0000  ferrous sulfate (FERROUSUL) 325 (65 FE) MG tablet  3 times daily with meals    Question:  Supervising Provider  Answer:  Paralee Cancel   12/29/16 0928   12/29/16 0000  polyethylene glycol (MIRALAX / Floria Raveling) packet  2 times daily    Question:  Supervising Provider  Answer:  Paralee Cancel   12/29/16 0928   12/29/16 0000  methocarbamol (ROBAXIN) 500 MG  tablet  Every 6 hours PRN    Question:  Supervising Provider  Answer:  Paralee Cancel   12/29/16 0928   12/29/16 0000  HYDROcodone-acetaminophen (NORCO) 7.5-325 MG tablet  Every 4 hours PRN    Question:  Supervising Provider  Answer:  Paralee Cancel   12/29/16 [id number]       Signed: West Pugh. Oralia Criger   PA-C  01/04/2017, 12:09 PM

## 2017-01-06 DIAGNOSIS — M1711 Unilateral primary osteoarthritis, right knee: Secondary | ICD-10-CM | POA: Diagnosis not present

## 2017-01-07 DIAGNOSIS — R7303 Prediabetes: Secondary | ICD-10-CM | POA: Diagnosis not present

## 2017-01-07 DIAGNOSIS — R110 Nausea: Secondary | ICD-10-CM | POA: Diagnosis not present

## 2017-01-07 DIAGNOSIS — I10 Essential (primary) hypertension: Secondary | ICD-10-CM | POA: Diagnosis not present

## 2017-01-07 DIAGNOSIS — Z96651 Presence of right artificial knee joint: Secondary | ICD-10-CM | POA: Diagnosis not present

## 2017-01-07 DIAGNOSIS — K449 Diaphragmatic hernia without obstruction or gangrene: Secondary | ICD-10-CM | POA: Diagnosis not present

## 2017-01-07 DIAGNOSIS — E039 Hypothyroidism, unspecified: Secondary | ICD-10-CM | POA: Diagnosis not present

## 2017-01-08 DIAGNOSIS — M1711 Unilateral primary osteoarthritis, right knee: Secondary | ICD-10-CM | POA: Diagnosis not present

## 2017-01-11 DIAGNOSIS — M1711 Unilateral primary osteoarthritis, right knee: Secondary | ICD-10-CM | POA: Diagnosis not present

## 2017-01-13 DIAGNOSIS — Z96651 Presence of right artificial knee joint: Secondary | ICD-10-CM | POA: Diagnosis not present

## 2017-01-13 DIAGNOSIS — Z471 Aftercare following joint replacement surgery: Secondary | ICD-10-CM | POA: Diagnosis not present

## 2017-01-14 DIAGNOSIS — M1711 Unilateral primary osteoarthritis, right knee: Secondary | ICD-10-CM | POA: Diagnosis not present

## 2017-01-18 DIAGNOSIS — M1711 Unilateral primary osteoarthritis, right knee: Secondary | ICD-10-CM | POA: Diagnosis not present

## 2017-01-21 DIAGNOSIS — M1711 Unilateral primary osteoarthritis, right knee: Secondary | ICD-10-CM | POA: Diagnosis not present

## 2017-01-26 DIAGNOSIS — M1711 Unilateral primary osteoarthritis, right knee: Secondary | ICD-10-CM | POA: Diagnosis not present

## 2017-01-28 DIAGNOSIS — M1711 Unilateral primary osteoarthritis, right knee: Secondary | ICD-10-CM | POA: Diagnosis not present

## 2017-02-01 DIAGNOSIS — M1711 Unilateral primary osteoarthritis, right knee: Secondary | ICD-10-CM | POA: Diagnosis not present

## 2017-02-04 DIAGNOSIS — M1711 Unilateral primary osteoarthritis, right knee: Secondary | ICD-10-CM | POA: Diagnosis not present

## 2017-02-08 DIAGNOSIS — M1711 Unilateral primary osteoarthritis, right knee: Secondary | ICD-10-CM | POA: Diagnosis not present

## 2017-02-12 DIAGNOSIS — M1711 Unilateral primary osteoarthritis, right knee: Secondary | ICD-10-CM | POA: Diagnosis not present

## 2017-02-15 DIAGNOSIS — M1711 Unilateral primary osteoarthritis, right knee: Secondary | ICD-10-CM | POA: Diagnosis not present

## 2017-02-17 DIAGNOSIS — Z471 Aftercare following joint replacement surgery: Secondary | ICD-10-CM | POA: Diagnosis not present

## 2017-02-17 DIAGNOSIS — Z96651 Presence of right artificial knee joint: Secondary | ICD-10-CM | POA: Diagnosis not present

## 2017-02-19 DIAGNOSIS — M1711 Unilateral primary osteoarthritis, right knee: Secondary | ICD-10-CM | POA: Diagnosis not present

## 2017-02-25 DIAGNOSIS — M1711 Unilateral primary osteoarthritis, right knee: Secondary | ICD-10-CM | POA: Diagnosis not present

## 2017-03-01 DIAGNOSIS — M1711 Unilateral primary osteoarthritis, right knee: Secondary | ICD-10-CM | POA: Diagnosis not present

## 2017-03-03 DIAGNOSIS — M1711 Unilateral primary osteoarthritis, right knee: Secondary | ICD-10-CM | POA: Diagnosis not present

## 2017-03-08 DIAGNOSIS — M1711 Unilateral primary osteoarthritis, right knee: Secondary | ICD-10-CM | POA: Diagnosis not present

## 2017-03-11 DIAGNOSIS — M1711 Unilateral primary osteoarthritis, right knee: Secondary | ICD-10-CM | POA: Diagnosis not present

## 2017-03-24 DIAGNOSIS — N8110 Cystocele, unspecified: Secondary | ICD-10-CM | POA: Diagnosis not present

## 2017-03-24 DIAGNOSIS — R300 Dysuria: Secondary | ICD-10-CM | POA: Diagnosis not present

## 2017-03-26 DIAGNOSIS — T148XXA Other injury of unspecified body region, initial encounter: Secondary | ICD-10-CM | POA: Diagnosis not present

## 2017-03-26 DIAGNOSIS — I10 Essential (primary) hypertension: Secondary | ICD-10-CM | POA: Diagnosis not present

## 2017-03-26 DIAGNOSIS — E7800 Pure hypercholesterolemia, unspecified: Secondary | ICD-10-CM | POA: Diagnosis not present

## 2017-03-26 DIAGNOSIS — M62838 Other muscle spasm: Secondary | ICD-10-CM | POA: Diagnosis not present

## 2017-03-26 DIAGNOSIS — E039 Hypothyroidism, unspecified: Secondary | ICD-10-CM | POA: Diagnosis not present

## 2017-03-26 DIAGNOSIS — R300 Dysuria: Secondary | ICD-10-CM | POA: Diagnosis not present

## 2017-03-26 DIAGNOSIS — L0390 Cellulitis, unspecified: Secondary | ICD-10-CM | POA: Diagnosis not present

## 2017-03-26 DIAGNOSIS — R7303 Prediabetes: Secondary | ICD-10-CM | POA: Diagnosis not present

## 2017-03-29 DIAGNOSIS — M791 Myalgia, unspecified site: Secondary | ICD-10-CM | POA: Diagnosis not present

## 2017-03-29 DIAGNOSIS — E039 Hypothyroidism, unspecified: Secondary | ICD-10-CM | POA: Diagnosis not present

## 2017-03-29 DIAGNOSIS — I10 Essential (primary) hypertension: Secondary | ICD-10-CM | POA: Diagnosis not present

## 2017-03-29 DIAGNOSIS — N3000 Acute cystitis without hematuria: Secondary | ICD-10-CM | POA: Diagnosis not present

## 2017-03-29 DIAGNOSIS — G4762 Sleep related leg cramps: Secondary | ICD-10-CM | POA: Diagnosis not present

## 2017-03-29 DIAGNOSIS — E7800 Pure hypercholesterolemia, unspecified: Secondary | ICD-10-CM | POA: Diagnosis not present

## 2017-03-30 DIAGNOSIS — N816 Rectocele: Secondary | ICD-10-CM | POA: Diagnosis not present

## 2017-03-30 DIAGNOSIS — R339 Retention of urine, unspecified: Secondary | ICD-10-CM | POA: Diagnosis not present

## 2017-03-31 DIAGNOSIS — Z471 Aftercare following joint replacement surgery: Secondary | ICD-10-CM | POA: Diagnosis not present

## 2017-03-31 DIAGNOSIS — Z96651 Presence of right artificial knee joint: Secondary | ICD-10-CM | POA: Diagnosis not present

## 2017-05-19 DIAGNOSIS — R399 Unspecified symptoms and signs involving the genitourinary system: Secondary | ICD-10-CM | POA: Diagnosis not present

## 2017-05-25 DIAGNOSIS — L570 Actinic keratosis: Secondary | ICD-10-CM | POA: Diagnosis not present

## 2017-05-25 DIAGNOSIS — Z85828 Personal history of other malignant neoplasm of skin: Secondary | ICD-10-CM | POA: Diagnosis not present

## 2017-05-25 DIAGNOSIS — L821 Other seborrheic keratosis: Secondary | ICD-10-CM | POA: Diagnosis not present

## 2017-05-31 DIAGNOSIS — M25561 Pain in right knee: Secondary | ICD-10-CM | POA: Diagnosis not present

## 2017-05-31 DIAGNOSIS — Z96651 Presence of right artificial knee joint: Secondary | ICD-10-CM | POA: Diagnosis not present

## 2017-06-02 DIAGNOSIS — R350 Frequency of micturition: Secondary | ICD-10-CM | POA: Diagnosis not present

## 2017-06-02 DIAGNOSIS — N8110 Cystocele, unspecified: Secondary | ICD-10-CM | POA: Diagnosis not present

## 2017-06-02 DIAGNOSIS — R3915 Urgency of urination: Secondary | ICD-10-CM | POA: Diagnosis not present

## 2017-06-02 DIAGNOSIS — R300 Dysuria: Secondary | ICD-10-CM | POA: Diagnosis not present

## 2017-06-02 DIAGNOSIS — N952 Postmenopausal atrophic vaginitis: Secondary | ICD-10-CM | POA: Diagnosis not present

## 2017-06-04 DIAGNOSIS — Z96651 Presence of right artificial knee joint: Secondary | ICD-10-CM | POA: Diagnosis not present

## 2017-06-04 DIAGNOSIS — M25561 Pain in right knee: Secondary | ICD-10-CM | POA: Diagnosis not present

## 2017-08-05 DIAGNOSIS — M25562 Pain in left knee: Secondary | ICD-10-CM | POA: Diagnosis not present

## 2017-08-05 DIAGNOSIS — M25561 Pain in right knee: Secondary | ICD-10-CM | POA: Diagnosis not present

## 2017-08-05 DIAGNOSIS — Z96651 Presence of right artificial knee joint: Secondary | ICD-10-CM | POA: Diagnosis not present

## 2017-08-06 DIAGNOSIS — E039 Hypothyroidism, unspecified: Secondary | ICD-10-CM | POA: Diagnosis not present

## 2017-08-06 DIAGNOSIS — R7303 Prediabetes: Secondary | ICD-10-CM | POA: Diagnosis not present

## 2017-08-06 DIAGNOSIS — Z820 Family history of epilepsy and other diseases of the nervous system: Secondary | ICD-10-CM | POA: Diagnosis not present

## 2017-08-06 DIAGNOSIS — I10 Essential (primary) hypertension: Secondary | ICD-10-CM | POA: Diagnosis not present

## 2017-08-06 DIAGNOSIS — M545 Low back pain: Secondary | ICD-10-CM | POA: Diagnosis not present

## 2017-08-06 DIAGNOSIS — R0789 Other chest pain: Secondary | ICD-10-CM | POA: Diagnosis not present

## 2017-08-06 DIAGNOSIS — E7800 Pure hypercholesterolemia, unspecified: Secondary | ICD-10-CM | POA: Diagnosis not present

## 2017-08-23 DIAGNOSIS — M25561 Pain in right knee: Secondary | ICD-10-CM | POA: Diagnosis not present

## 2017-08-23 DIAGNOSIS — M25562 Pain in left knee: Secondary | ICD-10-CM | POA: Diagnosis not present

## 2017-09-16 DIAGNOSIS — Z1231 Encounter for screening mammogram for malignant neoplasm of breast: Secondary | ICD-10-CM | POA: Diagnosis not present

## 2017-09-22 DIAGNOSIS — C44722 Squamous cell carcinoma of skin of right lower limb, including hip: Secondary | ICD-10-CM | POA: Diagnosis not present

## 2017-09-22 DIAGNOSIS — Z85828 Personal history of other malignant neoplasm of skin: Secondary | ICD-10-CM | POA: Diagnosis not present

## 2017-09-22 DIAGNOSIS — D485 Neoplasm of uncertain behavior of skin: Secondary | ICD-10-CM | POA: Diagnosis not present

## 2017-10-10 DIAGNOSIS — L03115 Cellulitis of right lower limb: Secondary | ICD-10-CM | POA: Diagnosis not present

## 2017-10-13 DIAGNOSIS — L03115 Cellulitis of right lower limb: Secondary | ICD-10-CM | POA: Diagnosis not present

## 2017-10-29 DIAGNOSIS — M79675 Pain in left toe(s): Secondary | ICD-10-CM | POA: Diagnosis not present

## 2017-10-29 DIAGNOSIS — M2041 Other hammer toe(s) (acquired), right foot: Secondary | ICD-10-CM | POA: Diagnosis not present

## 2017-10-29 DIAGNOSIS — B351 Tinea unguium: Secondary | ICD-10-CM | POA: Diagnosis not present

## 2017-10-29 DIAGNOSIS — M79674 Pain in right toe(s): Secondary | ICD-10-CM | POA: Diagnosis not present

## 2017-10-29 DIAGNOSIS — M21611 Bunion of right foot: Secondary | ICD-10-CM | POA: Diagnosis not present

## 2017-11-03 DIAGNOSIS — Z85828 Personal history of other malignant neoplasm of skin: Secondary | ICD-10-CM | POA: Diagnosis not present

## 2017-11-03 DIAGNOSIS — L570 Actinic keratosis: Secondary | ICD-10-CM | POA: Diagnosis not present

## 2017-11-03 DIAGNOSIS — C44622 Squamous cell carcinoma of skin of right upper limb, including shoulder: Secondary | ICD-10-CM | POA: Diagnosis not present

## 2017-11-03 DIAGNOSIS — D485 Neoplasm of uncertain behavior of skin: Secondary | ICD-10-CM | POA: Diagnosis not present

## 2018-01-28 DIAGNOSIS — M1712 Unilateral primary osteoarthritis, left knee: Secondary | ICD-10-CM | POA: Diagnosis not present

## 2018-01-28 DIAGNOSIS — M25562 Pain in left knee: Secondary | ICD-10-CM | POA: Diagnosis not present

## 2018-02-01 DIAGNOSIS — L821 Other seborrheic keratosis: Secondary | ICD-10-CM | POA: Diagnosis not present

## 2018-02-01 DIAGNOSIS — Z85828 Personal history of other malignant neoplasm of skin: Secondary | ICD-10-CM | POA: Diagnosis not present

## 2018-02-01 DIAGNOSIS — L570 Actinic keratosis: Secondary | ICD-10-CM | POA: Diagnosis not present

## 2018-02-02 DIAGNOSIS — Z09 Encounter for follow-up examination after completed treatment for conditions other than malignant neoplasm: Secondary | ICD-10-CM | POA: Diagnosis not present

## 2018-02-02 DIAGNOSIS — N952 Postmenopausal atrophic vaginitis: Secondary | ICD-10-CM | POA: Diagnosis not present

## 2018-02-02 DIAGNOSIS — R300 Dysuria: Secondary | ICD-10-CM | POA: Diagnosis not present

## 2018-02-02 DIAGNOSIS — N390 Urinary tract infection, site not specified: Secondary | ICD-10-CM | POA: Diagnosis not present

## 2018-02-02 DIAGNOSIS — R1032 Left lower quadrant pain: Secondary | ICD-10-CM | POA: Diagnosis not present

## 2018-02-02 DIAGNOSIS — N816 Rectocele: Secondary | ICD-10-CM | POA: Diagnosis not present

## 2018-02-03 DIAGNOSIS — R7303 Prediabetes: Secondary | ICD-10-CM | POA: Diagnosis not present

## 2018-02-03 DIAGNOSIS — R0789 Other chest pain: Secondary | ICD-10-CM | POA: Diagnosis not present

## 2018-02-03 DIAGNOSIS — Z820 Family history of epilepsy and other diseases of the nervous system: Secondary | ICD-10-CM | POA: Diagnosis not present

## 2018-02-03 DIAGNOSIS — Z1589 Genetic susceptibility to other disease: Secondary | ICD-10-CM | POA: Diagnosis not present

## 2018-02-03 DIAGNOSIS — E7800 Pure hypercholesterolemia, unspecified: Secondary | ICD-10-CM | POA: Diagnosis not present

## 2018-02-03 DIAGNOSIS — E039 Hypothyroidism, unspecified: Secondary | ICD-10-CM | POA: Diagnosis not present

## 2018-02-03 DIAGNOSIS — I10 Essential (primary) hypertension: Secondary | ICD-10-CM | POA: Diagnosis not present

## 2018-03-19 DIAGNOSIS — R110 Nausea: Secondary | ICD-10-CM | POA: Diagnosis not present

## 2018-03-19 DIAGNOSIS — I10 Essential (primary) hypertension: Secondary | ICD-10-CM | POA: Diagnosis not present

## 2018-03-19 DIAGNOSIS — E119 Type 2 diabetes mellitus without complications: Secondary | ICD-10-CM | POA: Diagnosis not present

## 2018-03-19 DIAGNOSIS — R079 Chest pain, unspecified: Secondary | ICD-10-CM | POA: Diagnosis not present

## 2018-05-11 DIAGNOSIS — M19272 Secondary osteoarthritis, left ankle and foot: Secondary | ICD-10-CM | POA: Diagnosis not present

## 2018-05-11 DIAGNOSIS — M25572 Pain in left ankle and joints of left foot: Secondary | ICD-10-CM | POA: Diagnosis not present

## 2018-05-16 DIAGNOSIS — H25013 Cortical age-related cataract, bilateral: Secondary | ICD-10-CM | POA: Diagnosis not present

## 2018-05-16 DIAGNOSIS — H5203 Hypermetropia, bilateral: Secondary | ICD-10-CM | POA: Diagnosis not present

## 2018-05-16 DIAGNOSIS — H52203 Unspecified astigmatism, bilateral: Secondary | ICD-10-CM | POA: Diagnosis not present

## 2018-05-18 ENCOUNTER — Ambulatory Visit (INDEPENDENT_AMBULATORY_CARE_PROVIDER_SITE_OTHER): Payer: Medicare Other | Admitting: Diagnostic Neuroimaging

## 2018-05-18 ENCOUNTER — Telehealth: Payer: Self-pay | Admitting: Diagnostic Neuroimaging

## 2018-05-18 ENCOUNTER — Encounter: Payer: Self-pay | Admitting: *Deleted

## 2018-05-18 ENCOUNTER — Encounter: Payer: Self-pay | Admitting: Diagnostic Neuroimaging

## 2018-05-18 DIAGNOSIS — R413 Other amnesia: Secondary | ICD-10-CM

## 2018-05-18 NOTE — Telephone Encounter (Signed)
Medicare/BCBS fed order sent to GI pt is aware.Margaret Guerrero

## 2018-05-18 NOTE — Progress Notes (Signed)
[facility]  PATIENT: Margaret Guerrero DOB: 12-13-1940  REFERRING CLINICIAN: Merita Norton HISTORY FROM: patient  REASON FOR VISIT: new consult    HISTORICAL  CHIEF COMPLAINT:  Chief Complaint  Patient presents with  . New Patient (Initial Visit)    Referred by Dr. Yaakov Guthrie  . APOE E3+    Rm 6,    HISTORY OF PRESENT ILLNESS:   78 year old female here for evaluation of memory loss.  Patient reports subjective short-term memory loss, difficulty with spelling, difficulty with keeping track of objects and recent events.  In November 2018 patient had family visiting for her husband's birthday party.  When family left she found a book related to "Alzheimer's disease" and thought that a family member had left it as a subtle hint to patient and her husband.  She confronted her family members but none of them have acknowledged that they had left this book for patient.  Since that time patient has read this book.  She is concerned about diagnosis of Alzheimer's disease for herself.  Her father had Alzheimer's disease and died at age 49 years old.  Patient is concerned about dementia for her own husband as well.  Patient is able to take care of all her own ADLs.  Patient requested ApoE allele gene testing; her result came back at e3/e3.   Patient is under significant high stress related to her home situation, consolidating to homes, as well as renovation work.  She also has out-of-town trips planned.  She is not sleeping well.   REVIEW OF SYSTEMS: Full 14 system review of systems performed and negative with exception of: Fatigue memory loss insomnia pain cramps urination problems decreased energy anxiety.   ALLERGIES: Allergies  Allergen Reactions  . Ciprofloxacin Itching  . Darvon [Propoxyphene]   . Demerol [Meperidine Hcl]   . Doxycycline Itching  . Fenofibrate     Muscle aches  . Neosporin [Neomycin-Bacitracin Zn-Polymyx]     "it causes cellulitis"  . Pravastatin      Muscle cramps   . Penicillins Itching and Rash    Has patient had a PCN reaction causing immediate rash, facial/tongue/throat swelling, SOB or lightheadedness with hypotension: No Has patient had a PCN reaction causing severe rash involving mucus membranes or skin necrosis: Unknown Has patient had a PCN reaction that required hospitalization: No Has patient had a PCN reaction occurring within the last 10 years: No If all of the above answers are "NO", then may proceed with Cephalosporin use.     HOME MEDICATIONS: Outpatient Medications Prior to Visit  Medication Sig Dispense Refill  . Calcium Carb-Cholecalciferol (CALCIUM 600 + D PO) Take 1 tablet by mouth daily.    . cholecalciferol (VITAMIN D) 1000 UNITS tablet Take 1,000 Units by mouth daily.    Marland Kitchen CINNAMON PO Take 1 capsule by mouth daily. 500mg  caps    . cyclobenzaprine (FLEXERIL) 10 MG tablet Take 10 mg by mouth at bedtime.    . dicyclomine (BENTYL) 20 MG tablet Take 1 tablet (20 mg total) by mouth 2 (two) times daily. 20 tablet 0  . docusate sodium (COLACE) 100 MG capsule Take 1 capsule (100 mg total) by mouth 2 (two) times daily. 10 capsule 0  . ferrous sulfate (FERROUSUL) 325 (65 FE) MG tablet Take 1 tablet (325 mg total) by mouth 3 (three) times daily with meals.  3  . glucosamine-chondroitin 500-400 MG tablet Take 2 tablets by mouth 3 (three) times a week.     Marland Kitchen  HYDROcodone-acetaminophen (NORCO) 7.5-325 MG tablet Take 1-2 tablets by mouth every 4 (four) hours as needed for moderate pain or severe pain. 60 tablet 0  . levothyroxine (SYNTHROID, LEVOTHROID) 50 MCG tablet Take 50 mcg by mouth daily before breakfast.    . lisinopril (PRINIVIL,ZESTRIL) 10 MG tablet Take 10 mg by mouth daily.    Marland Kitchen MAGNESIUM PO Take 1 tablet by mouth daily. 500mg  caps    . Multiple Vitamin (MULTIVITAMIN WITH MINERALS) TABS Take 1 tablet by mouth daily.    . mupirocin ointment (BACTROBAN) 2 % Place 1 application into the nose 3 (three) times daily.      . Omega-3 Fatty Acids (FISH OIL) 1000 MG CAPS Take 2,000 mg by mouth every evening.    Marland Kitchen OVER THE COUNTER MEDICATION Take 3 tablets by mouth daily. Heal-N-Soothe Anti inflammatory supplement    . polyethylene glycol (MIRALAX / GLYCOLAX) packet Take 17 g by mouth 2 (two) times daily. 14 each 0  . Potassium 99 MG TABS Take 99 mg by mouth daily.    . Probiotic Product (ALIGN PO) Take 1 tablet by mouth daily.    . TURMERIC PO Take 1,000 mg by mouth daily.     . vitamin C (ASCORBIC ACID) 250 MG tablet Take 250 mg by mouth daily.    . methocarbamol (ROBAXIN) 500 MG tablet Take 1 tablet (500 mg total) by mouth every 6 (six) hours as needed for muscle spasms. 40 tablet 0  . ondansetron (ZOFRAN) 4 MG tablet Take 1 tablet (4 mg total) by mouth every 8 (eight) hours as needed for nausea or vomiting. 30 tablet 0   No facility-administered medications prior to visit.     PAST MEDICAL HISTORY: Past Medical History:  Diagnosis Date  . Acute appendicitis s/p lap appy 08/21/2012 08/21/2012   Diagnosis Appendix, Other than Incidental - TRANSMURAL ACUTE APPENDICITIS AND PERIAPPENDICITIS.   Marland Kitchen Ankle fracture   . Arthritis   . Cancer ([place])    skin cancers removed  . DDD (degenerative disc disease), cervical    MRI 01  . Diverticulitis    and internal hemmorrhoids  . History of hiatal hernia 10/2004  . Hypercholesteremia   . Hypertension   . Hypothyroidism   . Migraine    occasionally  . Pre-diabetes   . SCC (squamous cell carcinoma)    RLE Dr. Wilhemina Bonito  . Seasonal allergies   . Spinal stenosis    Dr. Nelva Bush for epidiural injections    PAST SURGICAL HISTORY: Past Surgical History:  Procedure Laterality Date  . ANKLE FUSION  2006  . APPENDECTOMY    . CARPAL TUNNEL RELEASE     right wrist  . GANGLION CYST EXCISION     on the left  1964  . LAPAROSCOPIC APPENDECTOMY N/A 08/21/2012   Procedure: APPENDECTOMY LAPAROSCOPIC;  Surgeon: Adin Hector, MD;  Location: [facility];  Service: General;   Laterality: N/A;  . LAPAROSCOPIC TUBAL LIGATION    . MAXIMUM ACCESS (MAS)POSTERIOR LUMBAR INTERBODY FUSION (PLIF) 1 LEVEL N/A 05/11/2014   Procedure: Maximum Access Surgery Posterior Lumbar Interbody Fusion Lumbar four-five, Laminectomy Lumbar three Lumbar four;  Surgeon: Eustace Moore, MD;  Location: [place];  Service: Neurosurgery;  Laterality: N/A;  . skin cancers     removed  . THYROIDECTOMY Left 1978  . TONSILLECTOMY    . TOTAL KNEE ARTHROPLASTY Right 12/29/2016   Procedure: RIGHT TOTAL KNEE ARTHROPLASTY;  Surgeon: Paralee Cancel, MD;  Location: [facility];  Service: Orthopedics;  Laterality:  Right;  70 mins with regional block  . TUBAL LIGATION      FAMILY HISTORY: Family History  Problem Relation Age of Onset  . Diabetes Mother   . Stroke Mother   . Alzheimer's disease Father   . Cancer Maternal Aunt        bone  . Cancer Paternal Aunt        lung  . 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  Appendectomy as a teenager.  No metastatic disease    SOCIAL HISTORY: Social History   Socioeconomic History  . Marital status: Married    Spouse name: Not on file  . Number of children: Not on file  . Years of education: Not on file  . Highest education level: Not on file  Occupational History  . Not on file  Social Needs  . Financial resource strain: Not on file  . Food insecurity:    Worry: Not on file    Inability: Not on file  . Transportation needs:    Medical: Not on file    Non-medical: Not on file  Tobacco Use  . Smoking status: Never Smoker  . Smokeless tobacco: Never Used  Substance and Sexual Activity  . Alcohol use: Yes    Comment: occasionally  . Drug use: No  . Sexual activity: Yes  Lifestyle  . Physical activity:    Days per week: Not on file    Minutes per session: Not on file  . Stress: Not on file  Relationships  . Social connections:    Talks on phone: Not on file    Gets together: Not on file    Attends religious  service: Not on file    Active member of club or organization: Not on file    Attends meetings of clubs or organizations: Not on file    Relationship status: Not on file  . Intimate partner violence:    Fear of current or ex partner: Not on file    Emotionally abused: Not on file    Physically abused: Not on file    Forced sexual activity: Not on file  Other Topics Concern  . Not on file  Social History Narrative   ** Merged History Encounter **         PHYSICAL EXAM  GENERAL EXAM/CONSTITUTIONAL: Vitals:  Vitals:   05/18/18 1128  BP: 127/66  Pulse: 63  Weight: 150 lb (68 kg)  Height: 5' 4.5" (1.638 m)     Body mass index is 25.35 kg/m. Wt Readings from Last 3 Encounters:  05/18/18 150 lb (68 kg)  12/29/16 155 lb (70.3 kg)  12/22/16 155 lb (70.3 kg)     Patient is in no distress; well developed, nourished and groomed; neck is supple  CARDIOVASCULAR:  Examination of carotid arteries is normal; no carotid bruits  Regular rate and rhythm, no murmurs  Examination of peripheral vascular system by observation and palpation is normal  EYES:  Ophthalmoscopic exam of optic discs and posterior segments is normal; no papilledema or hemorrhages  Visual Acuity Screening   Right eye Left eye Both eyes  Without correction:     With correction: 20/20 20/30      MUSCULOSKELETAL:  Gait, strength, tone, movements noted in Neurologic exam below  NEUROLOGIC: MENTAL STATUS:  MMSE - Mini Mental State Exam 05/18/2018  Orientation to time 5  Orientation to Place 5  Registration 3  Attention/ Calculation  3  Recall 3  Language- name 2 objects 2  Language- repeat 1  Language- follow 3 step command 3  Language- read & follow direction 1  Write a sentence 1  Copy design 0  Total score 27    awake, alert, oriented to person, place and time  recent and remote memory intact  normal attention and concentration  language fluent, comprehension intact, naming  intact  fund of knowledge appropriate  CRANIAL NERVE:   2nd - no papilledema on fundoscopic exam  2nd, 3rd, 4th, 6th - pupils equal and reactive to light, visual fields full to confrontation, extraocular muscles intact, no nystagmus  5th - facial sensation symmetric  7th - facial strength symmetric  8th - hearing intact  9th - palate elevates symmetrically, uvula midline  11th - shoulder shrug symmetric  12th - tongue protrusion midline  MOTOR:   normal bulk and tone, full strength in the BUE, BLE  SENSORY:   normal and symmetric to light touch, temperature, vibration  COORDINATION:   finger-nose-finger, fine finger movements normal  REFLEXES:   deep tendon reflexes present and symmetric  GAIT/STATION:   narrow based gait     DIAGNOSTIC DATA (LABS, IMAGING, TESTING) - I reviewed patient records, labs, notes, testing and imaging myself where available.  Lab Results  Component Value Date   WBC 12.9 (H) 12/31/2016   HGB 11.2 (L) 12/31/2016   HCT 33.0 (L) 12/31/2016   MCV 84.2 12/31/2016   PLT 239 12/31/2016      Component Value Date/Time   NA 135 12/31/2016 0600   K 3.8 12/31/2016 0600   CL 103 12/31/2016 0600   CO2 25 12/31/2016 0600   GLUCOSE 101 (H) 12/31/2016 0600   BUN 19 12/31/2016 0600   CREATININE 0.76 12/31/2016 0600   CALCIUM 8.7 (L) 12/31/2016 0600   PROT 7.2 07/06/2014 1647   ALBUMIN 4.1 07/06/2014 1647   AST 23 07/06/2014 1647   ALT 16 07/06/2014 1647   ALKPHOS 66 07/06/2014 1647   BILITOT 0.7 07/06/2014 1647   GFRNONAA >60 12/31/2016 0600   GFRAA >60 12/31/2016 0600   No results found for: CHOL, HDL, LDLCALC, LDLDIRECT, TRIG, CHOLHDL Lab Results  Component Value Date   HGBA1C 5.7 (H) 12/22/2016   No results found for: VITAMINB12 No results found for: TSH     ASSESSMENT AND PLAN  78 y.o. year old female here with subjective short-term memory loss and confusion and losing objects, with no change in ADLs.  Also with high  stress levels and insomnia.  Also with ApoE gene test results e3/e3.   Most likely represents normal age-related memory complaints vs mild cognitive impairment.  Her gene test results do not confer an increased or decreased risk for Alzheimer's dementia.  Will check MRI of the brain to rule out other secondary causes.  Dx:  1. Memory loss      PLAN:  MEMORY LOSS - check MRI brain - optimize nutrition, exercise and sleep  Orders Placed This Encounter  Procedures  . MR BRAIN WO CONTRAST   Return for pending if symptoms worsen or fail to improve.    Penni Bombard, MD 2/69/4854, 62:70 PM Certified in Neurology, Neurophysiology and Neuroimaging  [facility] [address], [place], [place] [address] [phone]

## 2018-06-15 DIAGNOSIS — S51819A Laceration without foreign body of unspecified forearm, initial encounter: Secondary | ICD-10-CM | POA: Diagnosis not present

## 2018-06-16 ENCOUNTER — Other Ambulatory Visit: Payer: Self-pay

## 2018-06-22 DIAGNOSIS — S51811D Laceration without foreign body of right forearm, subsequent encounter: Secondary | ICD-10-CM | POA: Diagnosis not present

## 2018-08-01 ENCOUNTER — Emergency Department (HOSPITAL_BASED_OUTPATIENT_CLINIC_OR_DEPARTMENT_OTHER)
Admission: EM | Admit: 2018-08-01 | Discharge: 2018-08-01 | Disposition: A | Discharge status: Home / Self Care | Payer: Medicare Other | Attending: Emergency Medicine | Admitting: Emergency Medicine

## 2018-08-01 ENCOUNTER — Encounter (HOSPITAL_BASED_OUTPATIENT_CLINIC_OR_DEPARTMENT_OTHER): Payer: Self-pay | Admitting: Emergency Medicine

## 2018-08-01 ENCOUNTER — Other Ambulatory Visit: Payer: Self-pay

## 2018-08-01 ENCOUNTER — Emergency Department (HOSPITAL_BASED_OUTPATIENT_CLINIC_OR_DEPARTMENT_OTHER): Payer: Medicare Other

## 2018-08-01 DIAGNOSIS — Z85828 Personal history of other malignant neoplasm of skin: Secondary | ICD-10-CM | POA: Diagnosis not present

## 2018-08-01 DIAGNOSIS — I10 Essential (primary) hypertension: Secondary | ICD-10-CM | POA: Diagnosis not present

## 2018-08-01 DIAGNOSIS — E039 Hypothyroidism, unspecified: Secondary | ICD-10-CM | POA: Insufficient documentation

## 2018-08-01 DIAGNOSIS — Z79899 Other long term (current) drug therapy: Secondary | ICD-10-CM | POA: Diagnosis not present

## 2018-08-01 DIAGNOSIS — K5190 Ulcerative colitis, unspecified, without complications: Secondary | ICD-10-CM | POA: Diagnosis not present

## 2018-08-01 DIAGNOSIS — Z96651 Presence of right artificial knee joint: Secondary | ICD-10-CM | POA: Insufficient documentation

## 2018-08-01 DIAGNOSIS — R1031 Right lower quadrant pain: Secondary | ICD-10-CM | POA: Diagnosis not present

## 2018-08-01 DIAGNOSIS — K529 Noninfective gastroenteritis and colitis, unspecified: Secondary | ICD-10-CM

## 2018-08-01 DIAGNOSIS — R1110 Vomiting, unspecified: Secondary | ICD-10-CM | POA: Diagnosis not present

## 2018-08-01 LAB — URINALYSIS, ROUTINE W REFLEX MICROSCOPIC
Bilirubin Urine: NEGATIVE
Glucose, UA: NEGATIVE mg/dL
Ketones, ur: NEGATIVE mg/dL
Leukocytes,Ua: NEGATIVE
Nitrite: NEGATIVE
Protein, ur: NEGATIVE mg/dL
Specific Gravity, Urine: 1.01 (ref 1.005–1.030)
pH: 6.5 (ref 5.0–8.0)

## 2018-08-01 LAB — CBC WITH DIFFERENTIAL/PLATELET
Abs Immature Granulocytes: 0.05 10*3/uL (ref 0.00–0.07)
Basophils Absolute: 0 10*3/uL (ref 0.0–0.1)
Basophils Relative: 0 %
Eosinophils Absolute: 0 10*3/uL (ref 0.0–0.5)
Eosinophils Relative: 0 %
HCT: 44.4 % (ref 36.0–46.0)
Hemoglobin: 14.3 g/dL (ref 12.0–15.0)
Immature Granulocytes: 0 %
Lymphocytes Relative: 9 %
Lymphs Abs: 1.2 10*3/uL (ref 0.7–4.0)
MCH: 27.9 pg (ref 26.0–34.0)
MCHC: 32.2 g/dL (ref 30.0–36.0)
MCV: 86.5 fL (ref 80.0–100.0)
Monocytes Absolute: 0.5 10*3/uL (ref 0.1–1.0)
Monocytes Relative: 4 %
Neutro Abs: 12.4 10*3/uL — ABNORMAL HIGH (ref 1.7–7.7)
Neutrophils Relative %: 87 %
Platelets: 250 10*3/uL (ref 150–400)
RBC: 5.13 MIL/uL — ABNORMAL HIGH (ref 3.87–5.11)
RDW: 13.5 % (ref 11.5–15.5)
WBC: 14.2 10*3/uL — ABNORMAL HIGH (ref 4.0–10.5)
nRBC: 0 % (ref 0.0–0.2)

## 2018-08-01 LAB — COMPREHENSIVE METABOLIC PANEL
ALT: 18 U/L (ref 0–44)
AST: 23 U/L (ref 15–41)
Albumin: 4.4 g/dL (ref 3.5–5.0)
Alkaline Phosphatase: 62 U/L (ref 38–126)
Anion gap: 6 (ref 5–15)
BUN: 19 mg/dL (ref 8–23)
CO2: 25 mmol/L (ref 22–32)
Calcium: 9.1 mg/dL (ref 8.9–10.3)
Chloride: 101 mmol/L (ref 98–111)
Creatinine, Ser: 0.78 mg/dL (ref 0.44–1.00)
GFR calc Af Amer: >60 mL/min (ref >60)
GFR calc non Af Amer: >60 mL/min (ref >60)
Glucose, Bld: 113 mg/dL — ABNORMAL HIGH (ref 70–99)
Potassium: 3.9 mmol/L (ref 3.5–5.1)
Sodium: 132 mmol/L — ABNORMAL LOW (ref 135–145)
Total Bilirubin: 0.7 mg/dL (ref 0.3–1.2)
Total Protein: 7.6 g/dL (ref 6.5–8.1)

## 2018-08-01 LAB — LIPASE, BLOOD: Lipase: 43 U/L (ref 11–51)

## 2018-08-01 LAB — URINALYSIS, MICROSCOPIC (REFLEX)

## 2018-08-01 MED ORDER — SULFAMETHOXAZOLE-TRIMETHOPRIM 800-160 MG PO TABS: 1.0000 | ORAL_TABLET | Freq: Two times a day (BID) | ORAL | 0 refills | Status: AC

## 2018-08-01 MED ORDER — SULFAMETHOXAZOLE-TRIMETHOPRIM 800-160 MG PO TABS
1.0000 | ORAL_TABLET | Freq: Once | ORAL | Status: AC
  Administered 2018-08-01: 1 via ORAL
  Filled 2018-08-01: qty 1

## 2018-08-01 MED ORDER — METRONIDAZOLE 500 MG PO TABS: 500.0000 mg | ORAL_TABLET | Freq: Three times a day (TID) | ORAL | 0 refills | Status: DC

## 2018-08-01 MED ORDER — IOHEXOL 300 MG/ML  SOLN
100.0000 mL | Freq: Once | INTRAMUSCULAR | Status: AC | PRN
  Administered 2018-08-01: 100 mL via INTRAVENOUS

## 2018-08-01 MED ORDER — ACETAMINOPHEN 325 MG PO TABS
650.0000 mg | ORAL_TABLET | Freq: Once | ORAL | Status: AC
  Administered 2018-08-01: 650 mg via ORAL
  Filled 2018-08-01: qty 2

## 2018-08-01 MED ORDER — METRONIDAZOLE 500 MG PO TABS
500.0000 mg | ORAL_TABLET | Freq: Once | ORAL | Status: AC
  Administered 2018-08-01: 15:00:00 500 mg via ORAL
  Filled 2018-08-01: qty 1

## 2018-08-01 MED FILL — SULFAMETHOXAZOLE-TMP DS TAB: 800-160 | 7 days supply | Qty: 14 | Fill #0

## 2018-08-01 MED FILL — metroNIDAZOLE 500 MG TABS: 500 | 7 days supply | Qty: 21 | Fill #0

## 2018-08-01 NOTE — ED Notes (Signed)
Patient transported to CT and ambulatory back to room  - in no distress

## 2018-08-01 NOTE — ED Notes (Signed)
ED Provider at bedside. Explaining the tests results and talking about the plan of care

## 2018-08-01 NOTE — ED Provider Notes (Signed)
[place] EMERGENCY DEPARTMENT Provider Note   CSN: [id number] Arrival date & time: 08/01/18  1139    History   Chief Complaint Chief Complaint  Patient presents with  . Abdominal Pain    HPI Margaret Guerrero is a 78 y.o. female.     Patient with history of appendectomy 07/2012, hypertension, spinal surgery -- presents to the ED with c/o abdominal pain.  Pain is in the right lower quadrant.  It is worse with movement and palpation.  Pain started approximately 2 AM, however patient had an episode of vomiting late last night after eating dinner.  Patient took a Dulcolax at home and had several nonbloody bowel movements.  Her pain persists and has not gotten better.  No urinary symptoms.  Increased urinary frequency at patient baseline is normal for her.  No chest pain or shortness of breath.  No fever.  Patient states that she has an ongoing cough due to allergies.       Past Medical History:  Diagnosis Date  . Acute appendicitis s/p lap appy 08/21/2012 08/21/2012   Diagnosis Appendix, Other than Incidental - TRANSMURAL ACUTE APPENDICITIS AND PERIAPPENDICITIS.   Marland Kitchen Ankle fracture   . Arthritis   . Cancer ([place])    skin cancers removed  . DDD (degenerative disc disease), cervical    MRI 01  . Diverticulitis    and internal hemmorrhoids  . History of hiatal hernia 10/2004  . Hypercholesteremia   . Hypertension   . Hypothyroidism   . Migraine    occasionally  . Pre-diabetes   . SCC (squamous cell carcinoma)    RLE Dr. Wilhemina Bonito  . Seasonal allergies   . Spinal stenosis    Dr. Nelva Bush for epidiural injections    Patient Active Problem List   Diagnosis Date Noted  . Overweight (BMI 25.0-29.9) 12/30/2016  . S/P right TKA 12/29/2016  . S/P lumbar spinal fusion 05/11/2014  . Sciatic pain 09/07/2012  . Acute appendicitis s/p lap appy 08/21/2012 08/21/2012  . Hypertension     Past Surgical History:  Procedure Laterality Date  . ANKLE FUSION  2006  . APPENDECTOMY     . CARPAL TUNNEL RELEASE     right wrist  . GANGLION CYST EXCISION     on the left  1964  . LAPAROSCOPIC APPENDECTOMY N/A 08/21/2012   Procedure: APPENDECTOMY LAPAROSCOPIC;  Surgeon: Adin Hector, MD;  Location: [facility];  Service: General;  Laterality: N/A;  . LAPAROSCOPIC TUBAL LIGATION    . MAXIMUM ACCESS (MAS)POSTERIOR LUMBAR INTERBODY FUSION (PLIF) 1 LEVEL N/A 05/11/2014   Procedure: Maximum Access Surgery Posterior Lumbar Interbody Fusion Lumbar four-five, Laminectomy Lumbar three Lumbar four;  Surgeon: Eustace Moore, MD;  Location: [facility];  Service: Neurosurgery;  Laterality: N/A;  . skin cancers     removed  . THYROIDECTOMY Left 1978  . TONSILLECTOMY    . TOTAL KNEE ARTHROPLASTY Right 12/29/2016   Procedure: RIGHT TOTAL KNEE ARTHROPLASTY;  Surgeon: Paralee Cancel, MD;  Location: [facility];  Service: Orthopedics;  Laterality: Right;  70 mins with regional block  . TUBAL LIGATION       OB History    Gravida  0   Para  0   Term  0   Preterm  0   AB  0   Living        SAB  0   TAB  0   Ectopic  0   Multiple      Live  Births               Home Medications    Prior to Admission medications   Medication Sig Start Date End Date Taking? Authorizing Provider  Calcium Carb-Cholecalciferol (CALCIUM 600 + D PO) Take 1 tablet by mouth daily.    [provider]  cholecalciferol (VITAMIN D) 1000 UNITS tablet Take 1,000 Units by mouth daily.    [provider]  CINNAMON PO Take 1 capsule by mouth daily. 500mg  caps    [provider]  cyclobenzaprine (FLEXERIL) 10 MG tablet Take 10 mg by mouth at bedtime.    [provider]  dicyclomine (BENTYL) 20 MG tablet Take 1 tablet (20 mg total) by mouth 2 (two) times daily. 08/24/12   Julianne Rice, MD  docusate sodium (COLACE) 100 MG capsule Take 1 capsule (100 mg total) by mouth 2 (two) times daily. 12/29/16   Danae Orleans, PA-C  ferrous sulfate (FERROUSUL) 325 (65 FE) MG tablet Take 1 tablet  (325 mg total) by mouth 3 (three) times daily with meals. 12/29/16   Danae Orleans, PA-C  glucosamine-chondroitin 500-400 MG tablet Take 2 tablets by mouth 3 (three) times a week.     [provider]  HYDROcodone-acetaminophen (NORCO) 7.5-325 MG tablet Take 1-2 tablets by mouth every 4 (four) hours as needed for moderate pain or severe pain. 12/29/16   Danae Orleans, PA-C  levothyroxine (SYNTHROID, LEVOTHROID) 50 MCG tablet Take 50 mcg by mouth daily before breakfast.    [provider]  lisinopril (PRINIVIL,ZESTRIL) 10 MG tablet Take 10 mg by mouth daily.    [provider]  MAGNESIUM PO Take 1 tablet by mouth daily. 500mg  caps    [provider]  Multiple Vitamin (MULTIVITAMIN WITH MINERALS) TABS Take 1 tablet by mouth daily.    [provider]  mupirocin ointment (BACTROBAN) 2 % Place 1 application into the nose 3 (three) times daily.    [provider]  Omega-3 Fatty Acids (FISH OIL) 1000 MG CAPS Take 2,000 mg by mouth every evening.    [provider]  OVER THE COUNTER MEDICATION Take 3 tablets by mouth daily. Heal-N-Soothe Anti inflammatory supplement    [provider]  polyethylene glycol (MIRALAX / GLYCOLAX) packet Take 17 g by mouth 2 (two) times daily. 12/29/16   Danae Orleans, PA-C  Potassium 99 MG TABS Take 99 mg by mouth daily.    [provider]  Probiotic Product (ALIGN PO) Take 1 tablet by mouth daily.    [provider]  TURMERIC PO Take 1,000 mg by mouth daily.     [provider]  vitamin C (ASCORBIC ACID) 250 MG tablet Take 250 mg by mouth daily.    [provider]    Family History Family History  Problem Relation Age of Onset  . Diabetes Mother   . Stroke Mother   . Alzheimer's disease Father   . Cancer Maternal Aunt        bone  . Cancer Paternal Aunt        lung  . Cancer - Other Sister 6       Incidental cancer found on the appendix.  Appendectomy as a  teenager.  No metastatic disease    Social History Social History   Tobacco Use  . Smoking status: Never Smoker  . Smokeless tobacco: Never Used  Substance Use Topics  . Alcohol use: Yes    Comment: occasionally  . Drug use: No  Allergies   Ciprofloxacin; Darvon [propoxyphene]; Demerol [meperidine hcl]; Doxycycline; Fenofibrate; Neosporin [neomycin-bacitracin zn-polymyx]; Pravastatin; and Penicillins   Review of Systems Review of Systems  Constitutional: Negative for fever.  HENT: Negative for rhinorrhea and sore throat.   Eyes: Negative for redness.  Respiratory: Negative for cough.   Cardiovascular: Negative for chest pain.  Gastrointestinal: Positive for abdominal pain, nausea and vomiting. Negative for constipation and diarrhea.  Genitourinary: Negative for dysuria.  Musculoskeletal: Negative for myalgias.  Skin: Negative for rash.  Neurological: Negative for headaches.     Physical Exam Updated Vital Signs There were no vitals taken for this visit.  Physical Exam Vitals signs and nursing note reviewed.  Constitutional:      Appearance: She is well-developed.  HENT:     Head: Normocephalic and atraumatic.  Eyes:     General:        Right eye: No discharge.        Left eye: No discharge.     Conjunctiva/sclera: Conjunctivae normal.  Neck:     Musculoskeletal: Normal range of motion and neck supple.  Cardiovascular:     Rate and Rhythm: Normal rate and regular rhythm.     Heart sounds: Normal heart sounds.  Pulmonary:     Effort: Pulmonary effort is normal.     Breath sounds: Normal breath sounds.  Abdominal:     Palpations: Abdomen is soft.     Tenderness: There is abdominal tenderness in the right lower quadrant. There is no guarding or rebound. Positive signs include Rovsing's sign. Negative signs include Murphy's sign and McBurney's sign.  Skin:    General: Skin is warm and dry.  Neurological:     Mental Status: She is alert.      ED  Treatments / Results  Labs (all labs ordered are listed, but only abnormal results are displayed) Labs Reviewed - No data to display  EKG None  Radiology No results found.  Procedures Procedures (including critical care time)  Medications Ordered in ED Medications - No data to display   Initial Impression / Assessment and Plan / ED Course  I have reviewed the triage vital signs and the nursing notes.  Pertinent labs & imaging results that were available during my care of the patient were reviewed by me and considered in my medical decision making (see chart for details).        Patient seen and examined. Work-up initiated.   Vital signs reviewed and are as follows: BP (!) 163/70 (BP Location: Right Arm)   Pulse 90   Temp 98.8 F (37.1 C) (Oral)   Resp 20   Ht 5' 4.5" (1.638 m)   Wt 68 kg   SpO2 100%   BMI 25.35 kg/m   3:12 PM CT images personally reviewed and interpreted.  CT shows signs of colitis in the cecal area.  No signs of perforation or abscess.  Patient was started on Flagyl and Bactrim (allergy to Cipro and penicillins).  She tolerated these well here and has eaten some crackers.  She is ready for discharge to home.  Given risk of hyperkalemia on Bactrim and ACE inhibitor, patient will temporarily hold her lisinopril.  Encourage PCP follow-up in 3 days for recheck.  Discussed possible need for repeat imaging as CT cannot rule out mass involvement in the cecal area.  She will follow-up with her doctor regarding any need for repeat testing.  The patient was urged to return to the Emergency Department immediately with worsening of  current symptoms, worsening abdominal pain, persistent vomiting, blood noted in stools, fever, or any other concerns. The patient verbalized understanding.     Final Clinical Impressions(s) / ED Diagnoses   Final diagnoses:  Colitis   Patient with right lower quadrant pain, elevated white blood cell count, CT consistent with  colitis in the area of the cecum.  No complications.  Patient appears well.  No fever or tachycardia.  Doubt sepsis.  Patient started on oral antibiotics and is tolerated well.  She has appropriate primary follow-up.  Return directions as above.  ED Discharge Orders         Ordered    sulfamethoxazole-trimethoprim (BACTRIM DS,SEPTRA DS) 800-160 MG tablet  2 times daily     08/01/18 1505    metroNIDAZOLE (FLAGYL) 500 MG tablet  3 times daily     08/01/18 1505           Carlisle Cater, PA-C 08/01/18 1514    Sherwood Gambler, MD 08/01/18 1525

## 2018-08-01 NOTE — ED Notes (Signed)
Radene Ou, RN Given report

## 2018-08-01 NOTE — ED Triage Notes (Signed)
Reports RLQ abdominal pain that began at 0200 this morning.  C/o vomiting.  States that she took dulcolax and has had multiple bowel movements.

## 2018-08-01 NOTE — Discharge Instructions (Signed)
Please read and follow all provided instructions.  Your diagnoses today include:  1. Colitis     Tests performed today include:  Blood counts and electrolytes  Blood tests to check liver and kidney function  Blood tests to check pancreas function  Urine test to look for infection  CT scan -shows signs of colitis on the right lower abdomen, you may need repeat imaging in several weeks to make sure there is no mass or other problem in this area  Vital signs. See below for your results today.   Medications prescribed:   Metronidazole - antibiotic  You have been prescribed an antibiotic medicine: take the entire course of medicine even if you are feeling better. Stopping early can cause the antibiotic not to work. Do not drink alcohol when taking this medication.    Bactrim (trimethoprim/sulfamethoxazole) - antibiotic  You have been prescribed an antibiotic medicine: take the entire course of medicine even if you are feeling better. Stopping early can cause the antibiotic not to work.  Take any prescribed medications only as directed.  Home care instructions:   Follow any educational materials contained in this packet.  Please discontinue your lisinopril while taking Bactrim and restart lisinopril after completion of antibiotics  Follow-up instructions: Please follow-up with your primary care provider in the next 2 days for further evaluation of your symptoms.    Return instructions:  SEEK IMMEDIATE MEDICAL ATTENTION IF:  The pain does not go away or becomes severe   A temperature above 101F develops   Repeated vomiting occurs (multiple episodes)   The pain becomes localized to portions of the abdomen. The right side could possibly be appendicitis. In an adult, the left lower portion of the abdomen could be colitis or diverticulitis.   Blood is being passed in stools or vomit (bright red or black tarry stools)   You develop chest pain, difficulty breathing,  dizziness or fainting, or become confused, poorly responsive, or inconsolable (young children)  If you have any other emergent concerns regarding your health  Additional Information: Abdominal (belly) pain can be caused by many things. Your caregiver performed an examination and possibly ordered blood/urine tests and imaging (CT scan, x-rays, ultrasound). Many cases can be observed and treated at home after initial evaluation in the emergency department. Even though you are being discharged home, abdominal pain can be unpredictable. Therefore, you need a repeated exam if your pain does not resolve, returns, or worsens. Most patients with abdominal pain don't have to be admitted to the hospital or have surgery, but serious problems like appendicitis and gallbladder attacks can start out as nonspecific pain. Many abdominal conditions cannot be diagnosed in one visit, so follow-up evaluations are very important.  Your vital signs today were: BP (!) 141/77    Pulse 85    Temp 98.8 F (37.1 C) (Oral)    Resp 17    Ht 5' 4.5" (1.638 m)    Wt 68 kg    SpO2 99%    BMI 25.35 kg/m  If your blood pressure (bp) was elevated above 135/85 this visit, please have this repeated by your doctor within one month. --------------

## 2018-08-17 DIAGNOSIS — L570 Actinic keratosis: Secondary | ICD-10-CM | POA: Diagnosis not present

## 2018-08-17 DIAGNOSIS — L821 Other seborrheic keratosis: Secondary | ICD-10-CM | POA: Diagnosis not present

## 2018-08-17 DIAGNOSIS — Z85828 Personal history of other malignant neoplasm of skin: Secondary | ICD-10-CM | POA: Diagnosis not present

## 2018-08-17 DIAGNOSIS — L820 Inflamed seborrheic keratosis: Secondary | ICD-10-CM | POA: Diagnosis not present

## 2018-09-15 DIAGNOSIS — R7301 Impaired fasting glucose: Secondary | ICD-10-CM | POA: Diagnosis not present

## 2018-09-15 DIAGNOSIS — E039 Hypothyroidism, unspecified: Secondary | ICD-10-CM | POA: Diagnosis not present

## 2018-09-15 DIAGNOSIS — E7800 Pure hypercholesterolemia, unspecified: Secondary | ICD-10-CM | POA: Diagnosis not present

## 2018-09-15 DIAGNOSIS — M549 Dorsalgia, unspecified: Secondary | ICD-10-CM | POA: Diagnosis not present

## 2018-09-23 DIAGNOSIS — M6240 Contracture of muscle, unspecified site: Secondary | ICD-10-CM | POA: Diagnosis not present

## 2018-09-23 DIAGNOSIS — M9903 Segmental and somatic dysfunction of lumbar region: Secondary | ICD-10-CM | POA: Diagnosis not present

## 2018-09-23 DIAGNOSIS — M5137 Other intervertebral disc degeneration, lumbosacral region: Secondary | ICD-10-CM | POA: Diagnosis not present

## 2018-09-23 DIAGNOSIS — M9901 Segmental and somatic dysfunction of cervical region: Secondary | ICD-10-CM | POA: Diagnosis not present

## 2018-09-26 DIAGNOSIS — M6240 Contracture of muscle, unspecified site: Secondary | ICD-10-CM | POA: Diagnosis not present

## 2018-09-26 DIAGNOSIS — M5137 Other intervertebral disc degeneration, lumbosacral region: Secondary | ICD-10-CM | POA: Diagnosis not present

## 2018-09-26 DIAGNOSIS — M9903 Segmental and somatic dysfunction of lumbar region: Secondary | ICD-10-CM | POA: Diagnosis not present

## 2018-09-26 DIAGNOSIS — M9901 Segmental and somatic dysfunction of cervical region: Secondary | ICD-10-CM | POA: Diagnosis not present

## 2018-10-03 DIAGNOSIS — M6240 Contracture of muscle, unspecified site: Secondary | ICD-10-CM | POA: Diagnosis not present

## 2018-10-03 DIAGNOSIS — M5137 Other intervertebral disc degeneration, lumbosacral region: Secondary | ICD-10-CM | POA: Diagnosis not present

## 2018-10-03 DIAGNOSIS — M9901 Segmental and somatic dysfunction of cervical region: Secondary | ICD-10-CM | POA: Diagnosis not present

## 2018-10-03 DIAGNOSIS — M9903 Segmental and somatic dysfunction of lumbar region: Secondary | ICD-10-CM | POA: Diagnosis not present

## 2018-10-10 DIAGNOSIS — M6240 Contracture of muscle, unspecified site: Secondary | ICD-10-CM | POA: Diagnosis not present

## 2018-10-10 DIAGNOSIS — M9901 Segmental and somatic dysfunction of cervical region: Secondary | ICD-10-CM | POA: Diagnosis not present

## 2018-10-10 DIAGNOSIS — M9903 Segmental and somatic dysfunction of lumbar region: Secondary | ICD-10-CM | POA: Diagnosis not present

## 2018-10-10 DIAGNOSIS — M5137 Other intervertebral disc degeneration, lumbosacral region: Secondary | ICD-10-CM | POA: Diagnosis not present

## 2018-10-19 DIAGNOSIS — M5137 Other intervertebral disc degeneration, lumbosacral region: Secondary | ICD-10-CM | POA: Diagnosis not present

## 2018-10-19 DIAGNOSIS — M9903 Segmental and somatic dysfunction of lumbar region: Secondary | ICD-10-CM | POA: Diagnosis not present

## 2018-10-19 DIAGNOSIS — M9901 Segmental and somatic dysfunction of cervical region: Secondary | ICD-10-CM | POA: Diagnosis not present

## 2018-10-19 DIAGNOSIS — M6240 Contracture of muscle, unspecified site: Secondary | ICD-10-CM | POA: Diagnosis not present

## 2018-10-19 DIAGNOSIS — I10 Essential (primary) hypertension: Secondary | ICD-10-CM | POA: Diagnosis not present

## 2018-10-19 DIAGNOSIS — E7800 Pure hypercholesterolemia, unspecified: Secondary | ICD-10-CM | POA: Diagnosis not present

## 2018-10-19 DIAGNOSIS — E039 Hypothyroidism, unspecified: Secondary | ICD-10-CM | POA: Diagnosis not present

## 2018-10-19 DIAGNOSIS — R7301 Impaired fasting glucose: Secondary | ICD-10-CM | POA: Diagnosis not present

## 2018-10-21 DIAGNOSIS — I10 Essential (primary) hypertension: Secondary | ICD-10-CM | POA: Diagnosis not present

## 2018-10-21 DIAGNOSIS — R7301 Impaired fasting glucose: Secondary | ICD-10-CM | POA: Diagnosis not present

## 2018-10-21 DIAGNOSIS — E7800 Pure hypercholesterolemia, unspecified: Secondary | ICD-10-CM | POA: Diagnosis not present

## 2018-10-21 DIAGNOSIS — R5383 Other fatigue: Secondary | ICD-10-CM | POA: Diagnosis not present

## 2018-10-21 DIAGNOSIS — E039 Hypothyroidism, unspecified: Secondary | ICD-10-CM | POA: Diagnosis not present

## 2018-10-24 DIAGNOSIS — M25561 Pain in right knee: Secondary | ICD-10-CM | POA: Diagnosis not present

## 2018-10-24 DIAGNOSIS — M50223 Other cervical disc displacement at C6-C7 level: Secondary | ICD-10-CM | POA: Diagnosis not present

## 2018-10-24 DIAGNOSIS — M6240 Contracture of muscle, unspecified site: Secondary | ICD-10-CM | POA: Diagnosis not present

## 2018-10-24 DIAGNOSIS — E039 Hypothyroidism, unspecified: Secondary | ICD-10-CM | POA: Diagnosis not present

## 2018-10-24 DIAGNOSIS — M9901 Segmental and somatic dysfunction of cervical region: Secondary | ICD-10-CM | POA: Diagnosis not present

## 2018-10-24 DIAGNOSIS — E7800 Pure hypercholesterolemia, unspecified: Secondary | ICD-10-CM | POA: Diagnosis not present

## 2018-10-24 DIAGNOSIS — I10 Essential (primary) hypertension: Secondary | ICD-10-CM | POA: Diagnosis not present

## 2018-10-24 DIAGNOSIS — R7301 Impaired fasting glucose: Secondary | ICD-10-CM | POA: Diagnosis not present

## 2018-10-24 DIAGNOSIS — R5383 Other fatigue: Secondary | ICD-10-CM | POA: Diagnosis not present

## 2018-10-24 DIAGNOSIS — M791 Myalgia, unspecified site: Secondary | ICD-10-CM | POA: Diagnosis not present

## 2018-10-24 DIAGNOSIS — M546 Pain in thoracic spine: Secondary | ICD-10-CM | POA: Diagnosis not present

## 2018-10-24 DIAGNOSIS — M542 Cervicalgia: Secondary | ICD-10-CM | POA: Diagnosis not present

## 2018-10-24 DIAGNOSIS — M5137 Other intervertebral disc degeneration, lumbosacral region: Secondary | ICD-10-CM | POA: Diagnosis not present

## 2018-10-24 DIAGNOSIS — M9903 Segmental and somatic dysfunction of lumbar region: Secondary | ICD-10-CM | POA: Diagnosis not present

## 2018-11-03 DIAGNOSIS — M5137 Other intervertebral disc degeneration, lumbosacral region: Secondary | ICD-10-CM | POA: Diagnosis not present

## 2018-11-03 DIAGNOSIS — L03115 Cellulitis of right lower limb: Secondary | ICD-10-CM | POA: Diagnosis not present

## 2018-11-03 DIAGNOSIS — M9901 Segmental and somatic dysfunction of cervical region: Secondary | ICD-10-CM | POA: Diagnosis not present

## 2018-11-03 DIAGNOSIS — S8011XA Contusion of right lower leg, initial encounter: Secondary | ICD-10-CM | POA: Diagnosis not present

## 2018-11-03 DIAGNOSIS — M6240 Contracture of muscle, unspecified site: Secondary | ICD-10-CM | POA: Diagnosis not present

## 2018-11-03 DIAGNOSIS — M9903 Segmental and somatic dysfunction of lumbar region: Secondary | ICD-10-CM | POA: Diagnosis not present

## 2018-11-07 DIAGNOSIS — M9903 Segmental and somatic dysfunction of lumbar region: Secondary | ICD-10-CM | POA: Diagnosis not present

## 2018-11-07 DIAGNOSIS — M5137 Other intervertebral disc degeneration, lumbosacral region: Secondary | ICD-10-CM | POA: Diagnosis not present

## 2018-11-07 DIAGNOSIS — M9901 Segmental and somatic dysfunction of cervical region: Secondary | ICD-10-CM | POA: Diagnosis not present

## 2018-11-07 DIAGNOSIS — M6240 Contracture of muscle, unspecified site: Secondary | ICD-10-CM | POA: Diagnosis not present

## 2018-11-09 DIAGNOSIS — Z1231 Encounter for screening mammogram for malignant neoplasm of breast: Secondary | ICD-10-CM | POA: Diagnosis not present

## 2018-11-14 DIAGNOSIS — M6240 Contracture of muscle, unspecified site: Secondary | ICD-10-CM | POA: Diagnosis not present

## 2018-11-14 DIAGNOSIS — M9901 Segmental and somatic dysfunction of cervical region: Secondary | ICD-10-CM | POA: Diagnosis not present

## 2018-11-14 DIAGNOSIS — M9903 Segmental and somatic dysfunction of lumbar region: Secondary | ICD-10-CM | POA: Diagnosis not present

## 2018-11-14 DIAGNOSIS — M5137 Other intervertebral disc degeneration, lumbosacral region: Secondary | ICD-10-CM | POA: Diagnosis not present

## 2018-11-28 DIAGNOSIS — M6240 Contracture of muscle, unspecified site: Secondary | ICD-10-CM | POA: Diagnosis not present

## 2018-11-28 DIAGNOSIS — M5137 Other intervertebral disc degeneration, lumbosacral region: Secondary | ICD-10-CM | POA: Diagnosis not present

## 2018-11-28 DIAGNOSIS — M9903 Segmental and somatic dysfunction of lumbar region: Secondary | ICD-10-CM | POA: Diagnosis not present

## 2018-11-28 DIAGNOSIS — M9901 Segmental and somatic dysfunction of cervical region: Secondary | ICD-10-CM | POA: Diagnosis not present

## 2018-12-05 DIAGNOSIS — S8011XD Contusion of right lower leg, subsequent encounter: Secondary | ICD-10-CM | POA: Diagnosis not present

## 2018-12-05 DIAGNOSIS — M6240 Contracture of muscle, unspecified site: Secondary | ICD-10-CM | POA: Diagnosis not present

## 2018-12-05 DIAGNOSIS — M9901 Segmental and somatic dysfunction of cervical region: Secondary | ICD-10-CM | POA: Diagnosis not present

## 2018-12-05 DIAGNOSIS — M9903 Segmental and somatic dysfunction of lumbar region: Secondary | ICD-10-CM | POA: Diagnosis not present

## 2018-12-05 DIAGNOSIS — M5137 Other intervertebral disc degeneration, lumbosacral region: Secondary | ICD-10-CM | POA: Diagnosis not present

## 2018-12-05 DIAGNOSIS — L03115 Cellulitis of right lower limb: Secondary | ICD-10-CM | POA: Diagnosis not present

## 2018-12-09 DIAGNOSIS — L03115 Cellulitis of right lower limb: Secondary | ICD-10-CM | POA: Diagnosis not present

## 2018-12-12 DIAGNOSIS — M5137 Other intervertebral disc degeneration, lumbosacral region: Secondary | ICD-10-CM | POA: Diagnosis not present

## 2018-12-12 DIAGNOSIS — M9901 Segmental and somatic dysfunction of cervical region: Secondary | ICD-10-CM | POA: Diagnosis not present

## 2018-12-12 DIAGNOSIS — M546 Pain in thoracic spine: Secondary | ICD-10-CM | POA: Diagnosis not present

## 2018-12-12 DIAGNOSIS — M50223 Other cervical disc displacement at C6-C7 level: Secondary | ICD-10-CM | POA: Diagnosis not present

## 2018-12-12 DIAGNOSIS — M791 Myalgia, unspecified site: Secondary | ICD-10-CM | POA: Diagnosis not present

## 2018-12-12 DIAGNOSIS — M6240 Contracture of muscle, unspecified site: Secondary | ICD-10-CM | POA: Diagnosis not present

## 2018-12-12 DIAGNOSIS — M542 Cervicalgia: Secondary | ICD-10-CM | POA: Diagnosis not present

## 2018-12-12 DIAGNOSIS — M9903 Segmental and somatic dysfunction of lumbar region: Secondary | ICD-10-CM | POA: Diagnosis not present

## 2018-12-13 DIAGNOSIS — M62838 Other muscle spasm: Secondary | ICD-10-CM | POA: Diagnosis not present

## 2018-12-13 DIAGNOSIS — I10 Essential (primary) hypertension: Secondary | ICD-10-CM | POA: Diagnosis not present

## 2018-12-13 DIAGNOSIS — L97911 Non-pressure chronic ulcer of unspecified part of right lower leg limited to breakdown of skin: Secondary | ICD-10-CM | POA: Diagnosis not present

## 2018-12-13 DIAGNOSIS — I872 Venous insufficiency (chronic) (peripheral): Secondary | ICD-10-CM | POA: Diagnosis not present

## 2019-01-18 DIAGNOSIS — S81811A Laceration without foreign body, right lower leg, initial encounter: Secondary | ICD-10-CM | POA: Diagnosis not present

## 2019-01-24 DIAGNOSIS — M25562 Pain in left knee: Secondary | ICD-10-CM | POA: Diagnosis not present

## 2019-02-02 ENCOUNTER — Other Ambulatory Visit: Payer: Self-pay | Admitting: Orthopedic Surgery

## 2019-02-02 DIAGNOSIS — M25562 Pain in left knee: Secondary | ICD-10-CM

## 2019-02-21 DIAGNOSIS — C44622 Squamous cell carcinoma of skin of right upper limb, including shoulder: Secondary | ICD-10-CM | POA: Diagnosis not present

## 2019-02-21 DIAGNOSIS — L570 Actinic keratosis: Secondary | ICD-10-CM | POA: Diagnosis not present

## 2019-02-21 DIAGNOSIS — L814 Other melanin hyperpigmentation: Secondary | ICD-10-CM | POA: Diagnosis not present

## 2019-02-21 DIAGNOSIS — L820 Inflamed seborrheic keratosis: Secondary | ICD-10-CM | POA: Diagnosis not present

## 2019-02-21 DIAGNOSIS — D485 Neoplasm of uncertain behavior of skin: Secondary | ICD-10-CM | POA: Diagnosis not present

## 2019-02-21 DIAGNOSIS — Z85828 Personal history of other malignant neoplasm of skin: Secondary | ICD-10-CM | POA: Diagnosis not present

## 2019-02-21 DIAGNOSIS — L821 Other seborrheic keratosis: Secondary | ICD-10-CM | POA: Diagnosis not present

## 2019-02-21 DIAGNOSIS — M1712 Unilateral primary osteoarthritis, left knee: Secondary | ICD-10-CM | POA: Diagnosis not present

## 2019-02-21 DIAGNOSIS — D692 Other nonthrombocytopenic purpura: Secondary | ICD-10-CM | POA: Diagnosis not present

## 2019-02-24 DIAGNOSIS — S83242A Other tear of medial meniscus, current injury, left knee, initial encounter: Secondary | ICD-10-CM | POA: Diagnosis not present

## 2019-02-24 DIAGNOSIS — M1712 Unilateral primary osteoarthritis, left knee: Secondary | ICD-10-CM | POA: Diagnosis not present

## 2019-02-24 DIAGNOSIS — M25562 Pain in left knee: Secondary | ICD-10-CM | POA: Diagnosis not present

## 2019-03-15 DIAGNOSIS — L03115 Cellulitis of right lower limb: Secondary | ICD-10-CM | POA: Diagnosis not present

## 2019-03-16 DIAGNOSIS — E039 Hypothyroidism, unspecified: Secondary | ICD-10-CM | POA: Diagnosis not present

## 2019-03-16 DIAGNOSIS — L97911 Non-pressure chronic ulcer of unspecified part of right lower leg limited to breakdown of skin: Secondary | ICD-10-CM | POA: Diagnosis not present

## 2019-03-16 DIAGNOSIS — R7303 Prediabetes: Secondary | ICD-10-CM | POA: Diagnosis not present

## 2019-03-16 DIAGNOSIS — C44622 Squamous cell carcinoma of skin of right upper limb, including shoulder: Secondary | ICD-10-CM | POA: Diagnosis not present

## 2019-03-16 DIAGNOSIS — L03115 Cellulitis of right lower limb: Secondary | ICD-10-CM | POA: Diagnosis not present

## 2019-03-16 DIAGNOSIS — Z820 Family history of epilepsy and other diseases of the nervous system: Secondary | ICD-10-CM | POA: Diagnosis not present

## 2019-03-16 DIAGNOSIS — R0981 Nasal congestion: Secondary | ICD-10-CM | POA: Diagnosis not present

## 2019-03-16 DIAGNOSIS — M62838 Other muscle spasm: Secondary | ICD-10-CM | POA: Diagnosis not present

## 2019-03-16 DIAGNOSIS — I872 Venous insufficiency (chronic) (peripheral): Secondary | ICD-10-CM | POA: Diagnosis not present

## 2019-03-16 DIAGNOSIS — Z85828 Personal history of other malignant neoplasm of skin: Secondary | ICD-10-CM | POA: Diagnosis not present

## 2019-03-16 DIAGNOSIS — S83207D Unspecified tear of unspecified meniscus, current injury, left knee, subsequent encounter: Secondary | ICD-10-CM | POA: Diagnosis not present

## 2019-03-16 DIAGNOSIS — I10 Essential (primary) hypertension: Secondary | ICD-10-CM | POA: Diagnosis not present

## 2019-03-20 DIAGNOSIS — L03115 Cellulitis of right lower limb: Secondary | ICD-10-CM | POA: Diagnosis not present

## 2019-03-30 DIAGNOSIS — S81801A Unspecified open wound, right lower leg, initial encounter: Secondary | ICD-10-CM | POA: Diagnosis not present

## 2019-04-09 DIAGNOSIS — S81802D Unspecified open wound, left lower leg, subsequent encounter: Secondary | ICD-10-CM | POA: Diagnosis not present

## 2019-05-16 DIAGNOSIS — R05 Cough: Secondary | ICD-10-CM | POA: Diagnosis not present

## 2019-05-16 DIAGNOSIS — Z9109 Other allergy status, other than to drugs and biological substances: Secondary | ICD-10-CM | POA: Diagnosis not present

## 2019-05-30 DIAGNOSIS — S8012XA Contusion of left lower leg, initial encounter: Secondary | ICD-10-CM | POA: Diagnosis not present

## 2019-05-30 DIAGNOSIS — J45909 Unspecified asthma, uncomplicated: Secondary | ICD-10-CM | POA: Diagnosis not present

## 2019-06-05 DIAGNOSIS — Z85828 Personal history of other malignant neoplasm of skin: Secondary | ICD-10-CM | POA: Diagnosis not present

## 2019-06-05 DIAGNOSIS — C44622 Squamous cell carcinoma of skin of right upper limb, including shoulder: Secondary | ICD-10-CM | POA: Diagnosis not present

## 2019-06-05 DIAGNOSIS — D485 Neoplasm of uncertain behavior of skin: Secondary | ICD-10-CM | POA: Diagnosis not present

## 2019-06-13 DIAGNOSIS — I10 Essential (primary) hypertension: Secondary | ICD-10-CM | POA: Diagnosis not present

## 2019-06-13 DIAGNOSIS — M25511 Pain in right shoulder: Secondary | ICD-10-CM | POA: Diagnosis not present

## 2019-06-13 DIAGNOSIS — Z01818 Encounter for other preprocedural examination: Secondary | ICD-10-CM | POA: Diagnosis not present

## 2019-06-13 DIAGNOSIS — M23307 Other meniscus derangements, unspecified meniscus, left knee: Secondary | ICD-10-CM | POA: Diagnosis not present

## 2019-06-13 DIAGNOSIS — E039 Hypothyroidism, unspecified: Secondary | ICD-10-CM | POA: Diagnosis not present

## 2019-07-10 DIAGNOSIS — S83232A Complex tear of medial meniscus, current injury, left knee, initial encounter: Secondary | ICD-10-CM | POA: Diagnosis not present

## 2019-07-10 DIAGNOSIS — X58XXXA Exposure to other specified factors, initial encounter: Secondary | ICD-10-CM | POA: Diagnosis not present

## 2019-07-10 DIAGNOSIS — S83282A Other tear of lateral meniscus, current injury, left knee, initial encounter: Secondary | ICD-10-CM | POA: Diagnosis not present

## 2019-07-10 DIAGNOSIS — Y999 Unspecified external cause status: Secondary | ICD-10-CM | POA: Diagnosis not present

## 2019-07-10 DIAGNOSIS — M94262 Chondromalacia, left knee: Secondary | ICD-10-CM | POA: Diagnosis not present

## 2019-07-18 DIAGNOSIS — E7800 Pure hypercholesterolemia, unspecified: Secondary | ICD-10-CM | POA: Diagnosis not present

## 2019-07-18 DIAGNOSIS — E039 Hypothyroidism, unspecified: Secondary | ICD-10-CM | POA: Diagnosis not present

## 2019-07-18 DIAGNOSIS — M25511 Pain in right shoulder: Secondary | ICD-10-CM | POA: Diagnosis not present

## 2019-07-18 DIAGNOSIS — I10 Essential (primary) hypertension: Secondary | ICD-10-CM | POA: Diagnosis not present

## 2019-07-18 DIAGNOSIS — R7303 Prediabetes: Secondary | ICD-10-CM | POA: Diagnosis not present

## 2019-07-18 DIAGNOSIS — J309 Allergic rhinitis, unspecified: Secondary | ICD-10-CM | POA: Diagnosis not present

## 2019-08-22 DIAGNOSIS — I8311 Varicose veins of right lower extremity with inflammation: Secondary | ICD-10-CM | POA: Diagnosis not present

## 2019-08-22 DIAGNOSIS — D485 Neoplasm of uncertain behavior of skin: Secondary | ICD-10-CM | POA: Diagnosis not present

## 2019-08-22 DIAGNOSIS — L814 Other melanin hyperpigmentation: Secondary | ICD-10-CM | POA: Diagnosis not present

## 2019-08-22 DIAGNOSIS — I872 Venous insufficiency (chronic) (peripheral): Secondary | ICD-10-CM | POA: Diagnosis not present

## 2019-08-22 DIAGNOSIS — L821 Other seborrheic keratosis: Secondary | ICD-10-CM | POA: Diagnosis not present

## 2019-08-22 DIAGNOSIS — L820 Inflamed seborrheic keratosis: Secondary | ICD-10-CM | POA: Diagnosis not present

## 2019-08-22 DIAGNOSIS — Z85828 Personal history of other malignant neoplasm of skin: Secondary | ICD-10-CM | POA: Diagnosis not present

## 2019-08-22 DIAGNOSIS — L570 Actinic keratosis: Secondary | ICD-10-CM | POA: Diagnosis not present

## 2019-08-22 DIAGNOSIS — I8312 Varicose veins of left lower extremity with inflammation: Secondary | ICD-10-CM | POA: Diagnosis not present

## 2019-11-14 DIAGNOSIS — Z1231 Encounter for screening mammogram for malignant neoplasm of breast: Secondary | ICD-10-CM | POA: Diagnosis not present

## 2019-11-22 DIAGNOSIS — R928 Other abnormal and inconclusive findings on diagnostic imaging of breast: Secondary | ICD-10-CM | POA: Diagnosis not present

## 2019-12-21 DIAGNOSIS — D485 Neoplasm of uncertain behavior of skin: Secondary | ICD-10-CM | POA: Diagnosis not present

## 2019-12-21 DIAGNOSIS — Z85828 Personal history of other malignant neoplasm of skin: Secondary | ICD-10-CM | POA: Diagnosis not present

## 2019-12-21 DIAGNOSIS — C44622 Squamous cell carcinoma of skin of right upper limb, including shoulder: Secondary | ICD-10-CM | POA: Diagnosis not present

## 2020-01-24 DIAGNOSIS — M25562 Pain in left knee: Secondary | ICD-10-CM | POA: Diagnosis not present

## 2020-01-24 DIAGNOSIS — M1712 Unilateral primary osteoarthritis, left knee: Secondary | ICD-10-CM | POA: Diagnosis not present

## 2020-01-24 DIAGNOSIS — Z96651 Presence of right artificial knee joint: Secondary | ICD-10-CM | POA: Diagnosis not present

## 2020-01-24 DIAGNOSIS — Z471 Aftercare following joint replacement surgery: Secondary | ICD-10-CM | POA: Diagnosis not present

## 2020-02-09 DIAGNOSIS — S81811A Laceration without foreign body, right lower leg, initial encounter: Secondary | ICD-10-CM | POA: Diagnosis not present

## 2020-02-12 DIAGNOSIS — R059 Cough, unspecified: Secondary | ICD-10-CM | POA: Diagnosis not present

## 2020-02-12 DIAGNOSIS — U071 COVID-19: Secondary | ICD-10-CM | POA: Diagnosis not present

## 2020-02-12 DIAGNOSIS — J22 Unspecified acute lower respiratory infection: Secondary | ICD-10-CM | POA: Diagnosis not present

## 2020-02-13 ENCOUNTER — Telehealth (HOSPITAL_COMMUNITY): Payer: Self-pay

## 2020-02-13 NOTE — Telephone Encounter (Signed)
Called to Discuss with patient about Covid symptoms and the use of the monoclonal antibody infusion for those with mild to moderate Covid symptoms and at a high risk of hospitalization.     Pt appears to qualify for this infusion due to co-morbid conditions and/or a member of an at-risk group in accordance with the FDA Emergency Use Authorization.    Pt stated her symptoms started on Friday, 02/09/2020. She stated she went in for wound care at her doctor's office and informed them of her symptoms but was not tested. Her symptoms worsened and she returned for testing on Monday, 02/12/2020, at [facility] and tested positive. She is unvaccinated. She states her symptoms include fever, cough, fatigue, and headache. Qualifying risk factors include HTN, pre-diabetes, and age. Pt informed that an APP will follow up to make an appointment.

## 2020-02-14 ENCOUNTER — Other Ambulatory Visit: Payer: Self-pay | Admitting: Adult Health

## 2020-02-14 ENCOUNTER — Other Ambulatory Visit (HOSPITAL_COMMUNITY): Payer: Self-pay

## 2020-02-14 ENCOUNTER — Ambulatory Visit (HOSPITAL_COMMUNITY)
Admission: RE | Admit: 2020-02-14 | Discharge: 2020-02-14 | Disposition: A | Discharge status: Home / Self Care | Payer: Medicare Other | Source: Ambulatory Visit | Attending: Pulmonary Disease | Admitting: Pulmonary Disease

## 2020-02-14 DIAGNOSIS — U071 COVID-19: Secondary | ICD-10-CM | POA: Diagnosis not present

## 2020-02-14 DIAGNOSIS — Z23 Encounter for immunization: Secondary | ICD-10-CM | POA: Insufficient documentation

## 2020-02-14 MED ORDER — SODIUM CHLORIDE 0.9 % IV SOLN: Freq: Once | INTRAVENOUS | Status: AC

## 2020-02-14 MED ORDER — METHYLPREDNISOLONE SODIUM SUCC 125 MG IJ SOLR: 125.0000 mg | Freq: Once | INTRAMUSCULAR | Status: DC | PRN

## 2020-02-14 MED ORDER — SODIUM CHLORIDE 0.9 % IV SOLN: INTRAVENOUS | Status: DC | PRN

## 2020-02-14 MED ORDER — FAMOTIDINE IN NACL 20-0.9 MG/50ML-% IV SOLN: 20.0000 mg | Freq: Once | INTRAVENOUS | Status: DC | PRN

## 2020-02-14 MED ORDER — ALBUTEROL SULFATE HFA 108 (90 BASE) MCG/ACT IN AERS: 2.0000 | INHALATION_SPRAY | Freq: Once | RESPIRATORY_TRACT | Status: DC | PRN

## 2020-02-14 MED ORDER — EPINEPHRINE 0.3 MG/0.3ML IJ SOAJ: 0.3000 mg | Freq: Once | INTRAMUSCULAR | Status: DC | PRN

## 2020-02-14 MED ORDER — DIPHENHYDRAMINE HCL 50 MG/ML IJ SOLN: 50.0000 mg | Freq: Once | INTRAMUSCULAR | Status: DC | PRN

## 2020-02-14 NOTE — Progress Notes (Signed)
  Diagnosis: COVID-19  Physician: Dr. Joya Gaskins   Procedure: Covid Infusion Clinic Med: bamlanivimab\etesevimab infusion - Provided patient with bamlanimivab\etesevimab fact sheet for patients, parents and caregivers prior to infusion.  Complications: No immediate complications noted.  Discharge: Discharged home   Gabriel Cirri 02/14/2020

## 2020-02-14 NOTE — Progress Notes (Signed)
I connected by phone with Margaret Guerrero on 02/14/2020 at 9:21 AM to discuss the potential use of a new treatment for mild to moderate COVID-19 viral infection in non-hospitalized patients.  This patient is a 79 y.o. female that meets the FDA criteria for Emergency Use Authorization of COVID monoclonal antibody casirivimab/imdevimab or bamlanivimab/eteseviamb.  Has a (+) direct SARS-CoV-2 viral test result  Has mild or moderate COVID-19   Is NOT hospitalized due to COVID-19  Is within 10 days of symptom onset  Has at least one of the high risk factor(s) for progression to severe COVID-19 and/or hospitalization as defined in EUA.  Specific high risk criteria : Older age (>/= 80 yo)   I have spoken and communicated the following to the patient or parent/caregiver regarding COVID monoclonal antibody treatment:  1. FDA has authorized the emergency use for the treatment of mild to moderate COVID-19 in adults and pediatric patients with positive results of direct SARS-CoV-2 viral testing who are 29 years of age and older weighing at least 40 kg, and who are at high risk for progressing to severe COVID-19 and/or hospitalization.  2. The significant known and potential risks and benefits of COVID monoclonal antibody, and the extent to which such potential risks and benefits are unknown.  3. Information on available alternative treatments and the risks and benefits of those alternatives, including clinical trials.  4. Patients treated with COVID monoclonal antibody should continue to self-isolate and use infection control measures (e.g., wear mask, isolate, social distance, avoid sharing personal items, clean and disinfect "high touch" surfaces, and frequent handwashing) according to CDC guidelines.   5. The patient or parent/caregiver has the option to accept or refuse COVID monoclonal antibody treatment.  After reviewing this information with the patient, the patient has agreed to receive one of  the available covid 19 monoclonal antibodies and will be provided an appropriate fact sheet prior to infusion. Scot Dock, NP 02/14/2020 9:21 AM

## 2020-02-14 NOTE — Discharge Instructions (Signed)

## 2020-02-16 DIAGNOSIS — U071 COVID-19: Secondary | ICD-10-CM | POA: Diagnosis not present

## 2020-02-16 DIAGNOSIS — S81811D Laceration without foreign body, right lower leg, subsequent encounter: Secondary | ICD-10-CM | POA: Diagnosis not present

## 2020-02-19 ENCOUNTER — Other Ambulatory Visit: Payer: Self-pay

## 2020-02-19 ENCOUNTER — Emergency Department (HOSPITAL_COMMUNITY)
Admission: EM | Admit: 2020-02-19 | Discharge: 2020-02-19 | Disposition: A | Discharge status: Home / Self Care | Payer: Medicare Other | Attending: Emergency Medicine | Admitting: Emergency Medicine

## 2020-02-19 ENCOUNTER — Emergency Department (HOSPITAL_COMMUNITY): Payer: Medicare Other

## 2020-02-19 DIAGNOSIS — Z23 Encounter for immunization: Secondary | ICD-10-CM | POA: Insufficient documentation

## 2020-02-19 DIAGNOSIS — I10 Essential (primary) hypertension: Secondary | ICD-10-CM | POA: Insufficient documentation

## 2020-02-19 DIAGNOSIS — J1282 Pneumonia due to coronavirus disease 2019: Secondary | ICD-10-CM | POA: Diagnosis not present

## 2020-02-19 DIAGNOSIS — R059 Cough, unspecified: Secondary | ICD-10-CM | POA: Diagnosis present

## 2020-02-19 DIAGNOSIS — R7611 Nonspecific reaction to tuberculin skin test without active tuberculosis: Secondary | ICD-10-CM | POA: Diagnosis not present

## 2020-02-19 DIAGNOSIS — Z85828 Personal history of other malignant neoplasm of skin: Secondary | ICD-10-CM | POA: Insufficient documentation

## 2020-02-19 DIAGNOSIS — L089 Local infection of the skin and subcutaneous tissue, unspecified: Secondary | ICD-10-CM | POA: Insufficient documentation

## 2020-02-19 DIAGNOSIS — U071 COVID-19: Secondary | ICD-10-CM | POA: Insufficient documentation

## 2020-02-19 DIAGNOSIS — E039 Hypothyroidism, unspecified: Secondary | ICD-10-CM | POA: Diagnosis not present

## 2020-02-19 DIAGNOSIS — Z79899 Other long term (current) drug therapy: Secondary | ICD-10-CM | POA: Insufficient documentation

## 2020-02-19 MED ORDER — DOXYCYCLINE HYCLATE 100 MG PO CAPS: 100.0000 mg | ORAL_CAPSULE | Freq: Two times a day (BID) | ORAL | 0 refills | Status: DC

## 2020-02-19 MED ORDER — DOXYCYCLINE HYCLATE 100 MG PO TABS
100.0000 mg | ORAL_TABLET | Freq: Once | ORAL | Status: AC
  Administered 2020-02-19: 100 mg via ORAL
  Filled 2020-02-19: qty 1

## 2020-02-19 MED ORDER — TETANUS-DIPHTH-ACELL PERTUSSIS 5-2.5-18.5 LF-MCG/0.5 IM SUSY
0.5000 mL | PREFILLED_SYRINGE | Freq: Once | INTRAMUSCULAR | Status: AC
  Administered 2020-02-19: 0.5 mL via INTRAMUSCULAR
  Filled 2020-02-19: qty 0.5

## 2020-02-19 NOTE — ED Notes (Signed)
Patient ambulated in room and oxygen saturation stayed above 96% the entire time.

## 2020-02-19 NOTE — ED Provider Notes (Signed)
[facility] DEPT Provider Note   CSN: [id number] Arrival date & time: 02/19/20  1330     History Chief Complaint  Patient presents with  . Cough  . covid 19  . leg wound    Margaret Guerrero is a 79 y.o. female.  79 yo F with a chief complaint of cough fever.  This been going on for about 10 days now.  She was seen at a outside facility and had a positive Covid test.  She has had the monoclonal antibody.  Her husband has similar symptoms and also tested positive.  He has gotten better and she has had persistent symptoms.  She called her family doctor who suggested she come to the ED for an x-ray and possibly antibiotics.  On review of systems the patient mentioned that she had cut the back part of her right leg when getting off of a bus.  This was a couple weeks ago and is not yet healed.  She has trouble seeing it.  Does not know it is getting better or worse.  The history is provided by the patient.  Cough Associated symptoms: fever   Associated symptoms: no chest pain, no chills, no headaches, no myalgias, no rhinorrhea, no shortness of breath and no wheezing   Illness Severity:  Moderate Onset quality:  Gradual Duration:  10 days Timing:  Constant Progression:  Worsening Chronicity:  New Associated symptoms: cough and fever   Associated symptoms: no chest pain, no congestion, no headaches, no myalgias, no nausea, no rhinorrhea, no shortness of breath, no vomiting and no wheezing        Past Medical History:  Diagnosis Date  . Acute appendicitis s/p lap appy 08/21/2012 08/21/2012   Diagnosis Appendix, Other than Incidental - TRANSMURAL ACUTE APPENDICITIS AND PERIAPPENDICITIS.   Marland Kitchen Ankle fracture   . Arthritis   . Cancer ([place])    skin cancers removed  . DDD (degenerative disc disease), cervical    MRI 01  . Diverticulitis    and internal hemmorrhoids  . History of hiatal hernia 10/2004  . Hypercholesteremia   . Hypertension   .  Hypothyroidism   . Migraine    occasionally  . Pre-diabetes   . SCC (squamous cell carcinoma)    RLE Dr. Wilhemina Bonito  . Seasonal allergies   . Spinal stenosis    Dr. Nelva Bush for epidiural injections    Patient Active Problem List   Diagnosis Date Noted  . Overweight (BMI 25.0-29.9) 12/30/2016  . S/P right TKA 12/29/2016  . S/P lumbar spinal fusion 05/11/2014  . Sciatic pain 09/07/2012  . Acute appendicitis s/p lap appy 08/21/2012 08/21/2012  . Hypertension     Past Surgical History:  Procedure Laterality Date  . ANKLE FUSION  2006  . APPENDECTOMY    . CARPAL TUNNEL RELEASE     right wrist  . GANGLION CYST EXCISION     on the left  1964  . LAPAROSCOPIC APPENDECTOMY N/A 08/21/2012   Procedure: APPENDECTOMY LAPAROSCOPIC;  Surgeon: Adin Hector, MD;  Location: [facility];  Service: General;  Laterality: N/A;  . LAPAROSCOPIC TUBAL LIGATION    . MAXIMUM ACCESS (MAS)POSTERIOR LUMBAR INTERBODY FUSION (PLIF) 1 LEVEL N/A 05/11/2014   Procedure: Maximum Access Surgery Posterior Lumbar Interbody Fusion Lumbar four-five, Laminectomy Lumbar three Lumbar four;  Surgeon: Eustace Moore, MD;  Location: [facility];  Service: Neurosurgery;  Laterality: N/A;  . skin cancers     removed  . THYROIDECTOMY Left  [place]    . TOTAL KNEE ARTHROPLASTY Right 12/29/2016   Procedure: RIGHT TOTAL KNEE ARTHROPLASTY;  Surgeon: Paralee Cancel, MD;  Location: [facility];  Service: Orthopedics;  Laterality: Right;  70 mins with regional block  . TUBAL LIGATION       OB History    Gravida  0   Para  0   Term  0   Preterm  0   AB  0   Living        SAB  0   TAB  0   Ectopic  0   Multiple      Live Births              Family History  Problem Relation Age of Onset  . Diabetes Mother   . Stroke Mother   . Alzheimer's disease Father   . Cancer Maternal Aunt        bone  . Cancer Paternal Aunt        lung  . Cancer - Other Sister 17       Incidental cancer found on the appendix.   Appendectomy as a teenager.  No metastatic disease    Social History   Tobacco Use  . Smoking status: Never Smoker  . Smokeless tobacco: Never Used  Substance Use Topics  . Alcohol use: Yes    Comment: occasionally  . Drug use: No    Home Medications Prior to Admission medications   Medication Sig Start Date End Date Taking? Authorizing Provider  Calcium Carb-Cholecalciferol (CALCIUM 600 + D PO) Take 1 tablet by mouth daily.    [provider]  cholecalciferol (VITAMIN D) 1000 UNITS tablet Take 1,000 Units by mouth daily.    [provider]  CINNAMON PO Take 1 capsule by mouth daily. 500mg  caps    [provider]  cyclobenzaprine (FLEXERIL) 10 MG tablet Take 10 mg by mouth at bedtime.    [provider]  dicyclomine (BENTYL) 20 MG tablet Take 1 tablet (20 mg total) by mouth 2 (two) times daily. 08/24/12   Julianne Rice, MD  docusate sodium (COLACE) 100 MG capsule Take 1 capsule (100 mg total) by mouth 2 (two) times daily. 12/29/16   Danae Orleans, PA-C  doxycycline (VIBRAMYCIN) 100 MG capsule Take 1 capsule (100 mg total) by mouth 2 (two) times daily. One po bid x 7 days 02/19/20   Deno Etienne, DO  ferrous sulfate (FERROUSUL) 325 (65 FE) MG tablet Take 1 tablet (325 mg total) by mouth 3 (three) times daily with meals. 12/29/16   Danae Orleans, PA-C  glucosamine-chondroitin 500-400 MG tablet Take 2 tablets by mouth 3 (three) times a week.     [provider]  HYDROcodone-acetaminophen (NORCO) 7.5-325 MG tablet Take 1-2 tablets by mouth every 4 (four) hours as needed for moderate pain or severe pain. 12/29/16   Danae Orleans, PA-C  levothyroxine (SYNTHROID, LEVOTHROID) 50 MCG tablet Take 50 mcg by mouth daily before breakfast.    [provider]  lisinopril (PRINIVIL,ZESTRIL) 10 MG tablet Take 10 mg by mouth daily.    [provider]  MAGNESIUM PO Take 1 tablet by mouth daily. 500mg  caps    [provider]   metroNIDAZOLE (FLAGYL) 500 MG tablet Take 1 tablet (500 mg total) by mouth 3 (three) times daily. 08/01/18   Carlisle Cater, PA-C  Multiple Vitamin (MULTIVITAMIN WITH MINERALS) TABS Take 1 tablet by mouth daily.    [provider]  mupirocin  ointment (BACTROBAN) 2 % Place 1 application into the nose 3 (three) times daily.    [provider]  Omega-3 Fatty Acids (FISH OIL) 1000 MG CAPS Take 2,000 mg by mouth every evening.    [provider]  OVER THE COUNTER MEDICATION Take 3 tablets by mouth daily. Heal-N-Soothe Anti inflammatory supplement    [provider]  polyethylene glycol (MIRALAX / GLYCOLAX) packet Take 17 g by mouth 2 (two) times daily. 12/29/16   Danae Orleans, PA-C  Potassium 99 MG TABS Take 99 mg by mouth daily.    [provider]  Probiotic Product (ALIGN PO) Take 1 tablet by mouth daily.    [provider]  TURMERIC PO Take 1,000 mg by mouth daily.     [provider]  vitamin C (ASCORBIC ACID) 250 MG tablet Take 250 mg by mouth daily.    [provider]    Allergies    Ciprofloxacin, Darvon [propoxyphene], Demerol [meperidine hcl], Doxycycline, Fenofibrate, Neosporin [neomycin-bacitracin zn-polymyx], Pravastatin, and Penicillins  Review of Systems   Review of Systems  Constitutional: Positive for fever. Negative for chills.  HENT: Negative for congestion and rhinorrhea.   Eyes: Negative for redness and visual disturbance.  Respiratory: Positive for cough. Negative for shortness of breath and wheezing.   Cardiovascular: Negative for chest pain and palpitations.  Gastrointestinal: Negative for nausea and vomiting.  Genitourinary: Negative for dysuria and urgency.  Musculoskeletal: Negative for arthralgias and myalgias.  Skin: Negative for pallor and wound.  Neurological: Negative for dizziness and headaches.    Physical Exam Updated Vital Signs BP (!) 151/72   Pulse (!) 59   Temp 98.7 F (37.1 C)  (Oral)   Resp 19   Ht 5\' 4"  (1.626 m)   Wt 61.2 kg   SpO2 99%   BMI 23.17 kg/m   Physical Exam Vitals and nursing note reviewed.  Constitutional:      General: She is not in acute distress.    Appearance: She is well-developed. She is not diaphoretic.  HENT:     Head: Normocephalic and atraumatic.  Eyes:     Pupils: Pupils are equal, round, and reactive to light.  Cardiovascular:     Rate and Rhythm: Normal rate and regular rhythm.     Heart sounds: No murmur heard.  No friction rub. No gallop.   Pulmonary:     Effort: Pulmonary effort is normal.     Breath sounds: No wheezing or rales.  Abdominal:     General: There is no distension.     Palpations: Abdomen is soft.     Tenderness: There is no abdominal tenderness.  Musculoskeletal:        General: No tenderness.     Cervical back: Normal range of motion and neck supple.  Skin:    General: Skin is warm and dry.  Neurological:     Mental Status: She is alert and oriented to person, place, and time.  Psychiatric:        Behavior: Behavior normal.     ED Results / Procedures / Treatments   Labs (all labs ordered are listed, but only abnormal results are displayed) Labs Reviewed - No data to display  EKG None  Radiology DG Chest [facility] 1 View  Result Date: 02/19/2020 CLINICAL DATA:  COVID-19 positive EXAM: PORTABLE CHEST 1 VIEW COMPARISON:  08/27/2015 FINDINGS: The heart size and mediastinal contours are within normal limits. No focal airspace consolidation, pleural effusion, or pneumothorax. The visualized skeletal  structures are unremarkable. IMPRESSION: No active disease. Electronically Signed   By: Davina Poke D.O.   On: 02/19/2020 14:43    Procedures Procedures (including critical care time)  Medications Ordered in ED Medications  Tdap (BOOSTRIX) injection 0.5 mL (has no administration in time range)  doxycycline (VIBRA-TABS) tablet 100 mg (100 mg Oral Given 02/19/20 1419)    ED Course  I have  reviewed the triage vital signs and the nursing notes.  Pertinent labs & imaging results that were available during my care of the patient were reviewed by me and considered in my medical decision making (see chart for details).    MDM Rules/Calculators/A&P                          79 yo F with a chief complaints of having the coronavirus.  She has had symptoms now for 9 days.  She is well-appearing on my exam.  She is able to ambulate without issue.  Clear lung sounds.  Chest x-ray reviewed by me without focal infiltrates.  However on review of systems she told me that she had injured the back of her right calf.  She has foul-smelling and dirty wound.  There is no purulent drainage.  Will start on antibiotics.  We will have her follow-up with her family doctor.  3:42 PM:  I have discussed the diagnosis/risks/treatment options with the patient and believe the pt to be eligible for discharge home to follow-up with PCP. We also discussed returning to the ED immediately if new or worsening sx occur. We discussed the sx which are most concerning (e.g., sudden worsening pain,rapid spreading redness, fever, inability to tolerate by mouth) that necessitate immediate return. Medications administered to the patient during their visit and any new prescriptions provided to the patient are listed below.  Medications given during this visit Medications  Tdap (BOOSTRIX) injection 0.5 mL (has no administration in time range)  doxycycline (VIBRA-TABS) tablet 100 mg (100 mg Oral Given 02/19/20 1419)     The patient appears reasonably screen and/or stabilized for discharge and I doubt any other medical condition or other [facility] requiring further screening, evaluation, or treatment in the ED at this time prior to discharge.   Final Clinical Impression(s) / ED Diagnoses Final diagnoses:  Pneumonia due to COVID-19 virus  Wound infection    Rx / DC Orders ED Discharge Orders         Ordered    doxycycline  (VIBRAMYCIN) 100 MG capsule  2 times daily        02/19/20 [place], Elanora Quin, DO 02/19/20 1542

## 2020-02-19 NOTE — ED Triage Notes (Signed)
Patient arrives POV with covid and leg wound.  Patient was diagnosised on Monday with Covid but has been sick since Friday 02/09/2020. Patient has wound on the posterior of her right leg.  She hit it something stepping out of a van over last  Weekend.

## 2020-02-19 NOTE — Discharge Instructions (Signed)
Take tylenol 2 pills 4 times a day and motrin 4 pills 3 times a day.  Drink plenty of fluids.  Return for worsening shortness of breath, headache, confusion. Follow up with your family doctor.   

## 2020-04-11 DIAGNOSIS — D485 Neoplasm of uncertain behavior of skin: Secondary | ICD-10-CM | POA: Diagnosis not present

## 2020-04-11 DIAGNOSIS — Z85828 Personal history of other malignant neoplasm of skin: Secondary | ICD-10-CM | POA: Diagnosis not present

## 2020-04-11 DIAGNOSIS — L821 Other seborrheic keratosis: Secondary | ICD-10-CM | POA: Diagnosis not present

## 2020-04-11 DIAGNOSIS — L820 Inflamed seborrheic keratosis: Secondary | ICD-10-CM | POA: Diagnosis not present

## 2020-04-11 DIAGNOSIS — L570 Actinic keratosis: Secondary | ICD-10-CM | POA: Diagnosis not present

## 2020-04-12 DIAGNOSIS — S81811A Laceration without foreign body, right lower leg, initial encounter: Secondary | ICD-10-CM | POA: Diagnosis not present

## 2020-04-12 DIAGNOSIS — W548XXA Other contact with dog, initial encounter: Secondary | ICD-10-CM | POA: Diagnosis not present

## 2020-04-12 DIAGNOSIS — L0390 Cellulitis, unspecified: Secondary | ICD-10-CM | POA: Diagnosis not present

## 2020-04-18 DIAGNOSIS — I739 Peripheral vascular disease, unspecified: Secondary | ICD-10-CM | POA: Diagnosis not present

## 2020-04-18 DIAGNOSIS — T148XXA Other injury of unspecified body region, initial encounter: Secondary | ICD-10-CM | POA: Diagnosis not present

## 2020-04-25 DIAGNOSIS — T148XXA Other injury of unspecified body region, initial encounter: Secondary | ICD-10-CM | POA: Diagnosis not present

## 2020-04-25 DIAGNOSIS — I739 Peripheral vascular disease, unspecified: Secondary | ICD-10-CM | POA: Diagnosis not present

## 2020-05-20 DIAGNOSIS — E7800 Pure hypercholesterolemia, unspecified: Secondary | ICD-10-CM | POA: Diagnosis not present

## 2020-05-20 DIAGNOSIS — I10 Essential (primary) hypertension: Secondary | ICD-10-CM | POA: Diagnosis not present

## 2020-05-20 DIAGNOSIS — T798XXA Other early complications of trauma, initial encounter: Secondary | ICD-10-CM | POA: Diagnosis not present

## 2020-05-20 DIAGNOSIS — E039 Hypothyroidism, unspecified: Secondary | ICD-10-CM | POA: Diagnosis not present

## 2020-05-20 DIAGNOSIS — R7301 Impaired fasting glucose: Secondary | ICD-10-CM | POA: Diagnosis not present

## 2020-05-20 DIAGNOSIS — R0981 Nasal congestion: Secondary | ICD-10-CM | POA: Diagnosis not present

## 2020-05-24 DIAGNOSIS — R7301 Impaired fasting glucose: Secondary | ICD-10-CM | POA: Diagnosis not present

## 2020-05-24 DIAGNOSIS — E7800 Pure hypercholesterolemia, unspecified: Secondary | ICD-10-CM | POA: Diagnosis not present

## 2020-05-24 DIAGNOSIS — E039 Hypothyroidism, unspecified: Secondary | ICD-10-CM | POA: Diagnosis not present

## 2020-05-31 DIAGNOSIS — E039 Hypothyroidism, unspecified: Secondary | ICD-10-CM | POA: Diagnosis not present

## 2020-05-31 DIAGNOSIS — E7800 Pure hypercholesterolemia, unspecified: Secondary | ICD-10-CM | POA: Diagnosis not present

## 2020-05-31 DIAGNOSIS — R7301 Impaired fasting glucose: Secondary | ICD-10-CM | POA: Diagnosis not present

## 2020-07-25 DIAGNOSIS — E7800 Pure hypercholesterolemia, unspecified: Secondary | ICD-10-CM | POA: Diagnosis not present

## 2020-07-25 DIAGNOSIS — E039 Hypothyroidism, unspecified: Secondary | ICD-10-CM | POA: Diagnosis not present

## 2020-07-25 DIAGNOSIS — I10 Essential (primary) hypertension: Secondary | ICD-10-CM | POA: Diagnosis not present

## 2020-07-25 DIAGNOSIS — R7301 Impaired fasting glucose: Secondary | ICD-10-CM | POA: Diagnosis not present

## 2020-07-25 DIAGNOSIS — S80812A Abrasion, left lower leg, initial encounter: Secondary | ICD-10-CM | POA: Diagnosis not present

## 2020-10-15 DIAGNOSIS — C44622 Squamous cell carcinoma of skin of right upper limb, including shoulder: Secondary | ICD-10-CM | POA: Diagnosis not present

## 2020-10-15 DIAGNOSIS — D485 Neoplasm of uncertain behavior of skin: Secondary | ICD-10-CM | POA: Diagnosis not present

## 2020-10-15 DIAGNOSIS — Z85828 Personal history of other malignant neoplasm of skin: Secondary | ICD-10-CM | POA: Diagnosis not present

## 2020-10-15 DIAGNOSIS — L570 Actinic keratosis: Secondary | ICD-10-CM | POA: Diagnosis not present

## 2020-10-15 DIAGNOSIS — C44629 Squamous cell carcinoma of skin of left upper limb, including shoulder: Secondary | ICD-10-CM | POA: Diagnosis not present

## 2020-10-15 DIAGNOSIS — L821 Other seborrheic keratosis: Secondary | ICD-10-CM | POA: Diagnosis not present

## 2020-10-15 DIAGNOSIS — C44729 Squamous cell carcinoma of skin of left lower limb, including hip: Secondary | ICD-10-CM | POA: Diagnosis not present

## 2020-11-19 DIAGNOSIS — C44729 Squamous cell carcinoma of skin of left lower limb, including hip: Secondary | ICD-10-CM | POA: Diagnosis not present

## 2020-11-19 DIAGNOSIS — Z85828 Personal history of other malignant neoplasm of skin: Secondary | ICD-10-CM | POA: Diagnosis not present

## 2020-11-19 DIAGNOSIS — L570 Actinic keratosis: Secondary | ICD-10-CM | POA: Diagnosis not present

## 2020-11-25 DIAGNOSIS — Z1231 Encounter for screening mammogram for malignant neoplasm of breast: Secondary | ICD-10-CM | POA: Diagnosis not present

## 2020-11-26 DIAGNOSIS — G4762 Sleep related leg cramps: Secondary | ICD-10-CM | POA: Diagnosis not present

## 2020-11-26 DIAGNOSIS — E039 Hypothyroidism, unspecified: Secondary | ICD-10-CM | POA: Diagnosis not present

## 2020-11-26 DIAGNOSIS — Z79899 Other long term (current) drug therapy: Secondary | ICD-10-CM | POA: Diagnosis not present

## 2020-12-02 DIAGNOSIS — M1712 Unilateral primary osteoarthritis, left knee: Secondary | ICD-10-CM | POA: Diagnosis not present

## 2020-12-02 DIAGNOSIS — M25562 Pain in left knee: Secondary | ICD-10-CM | POA: Diagnosis not present

## 2020-12-02 DIAGNOSIS — Z96651 Presence of right artificial knee joint: Secondary | ICD-10-CM | POA: Diagnosis not present

## 2020-12-02 DIAGNOSIS — M25561 Pain in right knee: Secondary | ICD-10-CM | POA: Diagnosis not present

## 2020-12-30 DIAGNOSIS — Z20822 Contact with and (suspected) exposure to covid-19: Secondary | ICD-10-CM | POA: Diagnosis not present

## 2021-01-15 DIAGNOSIS — M1712 Unilateral primary osteoarthritis, left knee: Secondary | ICD-10-CM | POA: Diagnosis not present

## 2021-01-15 DIAGNOSIS — Z96651 Presence of right artificial knee joint: Secondary | ICD-10-CM | POA: Diagnosis not present

## 2021-01-15 DIAGNOSIS — M25561 Pain in right knee: Secondary | ICD-10-CM | POA: Diagnosis not present

## 2021-01-15 DIAGNOSIS — M25562 Pain in left knee: Secondary | ICD-10-CM | POA: Diagnosis not present

## 2021-02-02 DIAGNOSIS — Z6824 Body mass index (BMI) 24.0-24.9, adult: Secondary | ICD-10-CM | POA: Diagnosis not present

## 2021-02-02 DIAGNOSIS — S51812A Laceration without foreign body of left forearm, initial encounter: Secondary | ICD-10-CM | POA: Diagnosis not present

## 2021-02-07 DIAGNOSIS — M6289 Other specified disorders of muscle: Secondary | ICD-10-CM | POA: Diagnosis not present

## 2021-02-07 DIAGNOSIS — R899 Unspecified abnormal finding in specimens from other organs, systems and tissues: Secondary | ICD-10-CM | POA: Diagnosis not present

## 2021-02-07 DIAGNOSIS — E673 Hypervitaminosis D: Secondary | ICD-10-CM | POA: Diagnosis not present

## 2021-02-07 DIAGNOSIS — E039 Hypothyroidism, unspecified: Secondary | ICD-10-CM | POA: Diagnosis not present

## 2021-02-07 DIAGNOSIS — E7800 Pure hypercholesterolemia, unspecified: Secondary | ICD-10-CM | POA: Diagnosis not present

## 2021-02-07 DIAGNOSIS — I10 Essential (primary) hypertension: Secondary | ICD-10-CM | POA: Diagnosis not present

## 2021-02-07 DIAGNOSIS — R7301 Impaired fasting glucose: Secondary | ICD-10-CM | POA: Diagnosis not present

## 2021-02-12 DIAGNOSIS — Z4802 Encounter for removal of sutures: Secondary | ICD-10-CM | POA: Diagnosis not present

## 2021-02-18 DIAGNOSIS — S81809A Unspecified open wound, unspecified lower leg, initial encounter: Secondary | ICD-10-CM | POA: Diagnosis not present

## 2021-03-10 DIAGNOSIS — G8929 Other chronic pain: Secondary | ICD-10-CM | POA: Diagnosis not present

## 2021-03-10 DIAGNOSIS — R7301 Impaired fasting glucose: Secondary | ICD-10-CM | POA: Diagnosis not present

## 2021-03-10 DIAGNOSIS — I10 Essential (primary) hypertension: Secondary | ICD-10-CM | POA: Diagnosis not present

## 2021-03-10 DIAGNOSIS — M25572 Pain in left ankle and joints of left foot: Secondary | ICD-10-CM | POA: Diagnosis not present

## 2021-03-10 DIAGNOSIS — E039 Hypothyroidism, unspecified: Secondary | ICD-10-CM | POA: Diagnosis not present

## 2021-03-10 DIAGNOSIS — E7800 Pure hypercholesterolemia, unspecified: Secondary | ICD-10-CM | POA: Diagnosis not present

## 2021-03-26 DIAGNOSIS — M19079 Primary osteoarthritis, unspecified ankle and foot: Secondary | ICD-10-CM | POA: Diagnosis not present

## 2021-03-26 DIAGNOSIS — M25572 Pain in left ankle and joints of left foot: Secondary | ICD-10-CM | POA: Diagnosis not present

## 2021-04-07 DIAGNOSIS — L820 Inflamed seborrheic keratosis: Secondary | ICD-10-CM | POA: Diagnosis not present

## 2021-04-07 DIAGNOSIS — D485 Neoplasm of uncertain behavior of skin: Secondary | ICD-10-CM | POA: Diagnosis not present

## 2021-04-07 DIAGNOSIS — L570 Actinic keratosis: Secondary | ICD-10-CM | POA: Diagnosis not present

## 2021-04-07 DIAGNOSIS — L821 Other seborrheic keratosis: Secondary | ICD-10-CM | POA: Diagnosis not present

## 2021-04-07 DIAGNOSIS — C44612 Basal cell carcinoma of skin of right upper limb, including shoulder: Secondary | ICD-10-CM | POA: Diagnosis not present

## 2021-04-07 DIAGNOSIS — Z85828 Personal history of other malignant neoplasm of skin: Secondary | ICD-10-CM | POA: Diagnosis not present

## 2021-04-07 DIAGNOSIS — D692 Other nonthrombocytopenic purpura: Secondary | ICD-10-CM | POA: Diagnosis not present

## 2021-04-14 DIAGNOSIS — M79674 Pain in right toe(s): Secondary | ICD-10-CM | POA: Diagnosis not present

## 2021-04-14 DIAGNOSIS — I872 Venous insufficiency (chronic) (peripheral): Secondary | ICD-10-CM | POA: Diagnosis not present

## 2021-04-14 DIAGNOSIS — B351 Tinea unguium: Secondary | ICD-10-CM | POA: Diagnosis not present

## 2021-04-14 DIAGNOSIS — L03031 Cellulitis of right toe: Secondary | ICD-10-CM | POA: Diagnosis not present

## 2021-04-14 DIAGNOSIS — M79675 Pain in left toe(s): Secondary | ICD-10-CM | POA: Diagnosis not present

## 2021-04-23 DIAGNOSIS — Z6826 Body mass index (BMI) 26.0-26.9, adult: Secondary | ICD-10-CM | POA: Diagnosis not present

## 2021-04-23 DIAGNOSIS — T148XXA Other injury of unspecified body region, initial encounter: Secondary | ICD-10-CM | POA: Diagnosis not present

## 2021-06-02 DIAGNOSIS — M546 Pain in thoracic spine: Secondary | ICD-10-CM | POA: Diagnosis not present

## 2021-06-02 DIAGNOSIS — M542 Cervicalgia: Secondary | ICD-10-CM | POA: Diagnosis not present

## 2021-06-02 DIAGNOSIS — M9902 Segmental and somatic dysfunction of thoracic region: Secondary | ICD-10-CM | POA: Diagnosis not present

## 2021-06-02 DIAGNOSIS — M791 Myalgia, unspecified site: Secondary | ICD-10-CM | POA: Diagnosis not present

## 2021-06-02 DIAGNOSIS — R293 Abnormal posture: Secondary | ICD-10-CM | POA: Diagnosis not present

## 2021-06-02 DIAGNOSIS — M9903 Segmental and somatic dysfunction of lumbar region: Secondary | ICD-10-CM | POA: Diagnosis not present

## 2021-06-02 DIAGNOSIS — M9901 Segmental and somatic dysfunction of cervical region: Secondary | ICD-10-CM | POA: Diagnosis not present

## 2021-06-02 DIAGNOSIS — M5459 Other low back pain: Secondary | ICD-10-CM | POA: Diagnosis not present

## 2021-06-03 DIAGNOSIS — H524 Presbyopia: Secondary | ICD-10-CM | POA: Diagnosis not present

## 2021-06-03 DIAGNOSIS — H35033 Hypertensive retinopathy, bilateral: Secondary | ICD-10-CM | POA: Diagnosis not present

## 2021-06-06 DIAGNOSIS — S81801A Unspecified open wound, right lower leg, initial encounter: Secondary | ICD-10-CM | POA: Diagnosis not present

## 2021-06-27 DIAGNOSIS — S81801A Unspecified open wound, right lower leg, initial encounter: Secondary | ICD-10-CM | POA: Diagnosis not present

## 2021-07-07 DIAGNOSIS — U071 COVID-19: Secondary | ICD-10-CM | POA: Diagnosis not present

## 2021-07-07 DIAGNOSIS — R52 Pain, unspecified: Secondary | ICD-10-CM | POA: Diagnosis not present

## 2021-07-07 DIAGNOSIS — R0981 Nasal congestion: Secondary | ICD-10-CM | POA: Diagnosis not present

## 2021-07-07 DIAGNOSIS — R519 Headache, unspecified: Secondary | ICD-10-CM | POA: Diagnosis not present

## 2021-07-14 ENCOUNTER — Other Ambulatory Visit: Payer: Self-pay

## 2021-07-14 ENCOUNTER — Encounter (HOSPITAL_BASED_OUTPATIENT_CLINIC_OR_DEPARTMENT_OTHER): Payer: Federal, State, Local not specified - PPO | Admitting: General Surgery

## 2021-07-21 DIAGNOSIS — M791 Myalgia, unspecified site: Secondary | ICD-10-CM | POA: Diagnosis not present

## 2021-07-21 DIAGNOSIS — M546 Pain in thoracic spine: Secondary | ICD-10-CM | POA: Diagnosis not present

## 2021-07-21 DIAGNOSIS — R293 Abnormal posture: Secondary | ICD-10-CM | POA: Diagnosis not present

## 2021-07-21 DIAGNOSIS — M5459 Other low back pain: Secondary | ICD-10-CM | POA: Diagnosis not present

## 2021-07-21 DIAGNOSIS — M542 Cervicalgia: Secondary | ICD-10-CM | POA: Diagnosis not present

## 2021-07-21 DIAGNOSIS — M9903 Segmental and somatic dysfunction of lumbar region: Secondary | ICD-10-CM | POA: Diagnosis not present

## 2021-07-21 DIAGNOSIS — M9901 Segmental and somatic dysfunction of cervical region: Secondary | ICD-10-CM | POA: Diagnosis not present

## 2021-07-21 DIAGNOSIS — M9902 Segmental and somatic dysfunction of thoracic region: Secondary | ICD-10-CM | POA: Diagnosis not present

## 2021-07-22 DIAGNOSIS — Z20822 Contact with and (suspected) exposure to covid-19: Secondary | ICD-10-CM | POA: Diagnosis not present

## 2021-08-07 DIAGNOSIS — L03116 Cellulitis of left lower limb: Secondary | ICD-10-CM | POA: Diagnosis not present

## 2021-08-07 DIAGNOSIS — T798XXA Other early complications of trauma, initial encounter: Secondary | ICD-10-CM | POA: Diagnosis not present

## 2021-08-11 DIAGNOSIS — R200 Anesthesia of skin: Secondary | ICD-10-CM | POA: Diagnosis not present

## 2021-08-11 DIAGNOSIS — R7301 Impaired fasting glucose: Secondary | ICD-10-CM | POA: Diagnosis not present

## 2021-08-11 DIAGNOSIS — T798XXA Other early complications of trauma, initial encounter: Secondary | ICD-10-CM | POA: Diagnosis not present

## 2021-08-11 DIAGNOSIS — Z1211 Encounter for screening for malignant neoplasm of colon: Secondary | ICD-10-CM | POA: Diagnosis not present

## 2021-08-11 DIAGNOSIS — M19072 Primary osteoarthritis, left ankle and foot: Secondary | ICD-10-CM | POA: Diagnosis not present

## 2021-08-11 DIAGNOSIS — L03116 Cellulitis of left lower limb: Secondary | ICD-10-CM | POA: Diagnosis not present

## 2021-08-11 DIAGNOSIS — E039 Hypothyroidism, unspecified: Secondary | ICD-10-CM | POA: Diagnosis not present

## 2021-08-11 DIAGNOSIS — E7800 Pure hypercholesterolemia, unspecified: Secondary | ICD-10-CM | POA: Diagnosis not present

## 2021-08-11 DIAGNOSIS — I10 Essential (primary) hypertension: Secondary | ICD-10-CM | POA: Diagnosis not present

## 2021-08-11 DIAGNOSIS — M19079 Primary osteoarthritis, unspecified ankle and foot: Secondary | ICD-10-CM | POA: Diagnosis not present

## 2021-08-13 DIAGNOSIS — E7800 Pure hypercholesterolemia, unspecified: Secondary | ICD-10-CM | POA: Diagnosis not present

## 2021-08-13 DIAGNOSIS — R7301 Impaired fasting glucose: Secondary | ICD-10-CM | POA: Diagnosis not present

## 2021-08-13 DIAGNOSIS — Z1211 Encounter for screening for malignant neoplasm of colon: Secondary | ICD-10-CM | POA: Diagnosis not present

## 2021-08-13 DIAGNOSIS — E039 Hypothyroidism, unspecified: Secondary | ICD-10-CM | POA: Diagnosis not present

## 2021-08-18 DIAGNOSIS — M542 Cervicalgia: Secondary | ICD-10-CM | POA: Diagnosis not present

## 2021-08-18 DIAGNOSIS — M9901 Segmental and somatic dysfunction of cervical region: Secondary | ICD-10-CM | POA: Diagnosis not present

## 2021-08-18 DIAGNOSIS — M9902 Segmental and somatic dysfunction of thoracic region: Secondary | ICD-10-CM | POA: Diagnosis not present

## 2021-08-18 DIAGNOSIS — M9903 Segmental and somatic dysfunction of lumbar region: Secondary | ICD-10-CM | POA: Diagnosis not present

## 2021-08-18 DIAGNOSIS — M546 Pain in thoracic spine: Secondary | ICD-10-CM | POA: Diagnosis not present

## 2021-08-18 DIAGNOSIS — M791 Myalgia, unspecified site: Secondary | ICD-10-CM | POA: Diagnosis not present

## 2021-08-18 DIAGNOSIS — R293 Abnormal posture: Secondary | ICD-10-CM | POA: Diagnosis not present

## 2021-08-18 DIAGNOSIS — M5459 Other low back pain: Secondary | ICD-10-CM | POA: Diagnosis not present

## 2021-08-23 DIAGNOSIS — Z20822 Contact with and (suspected) exposure to covid-19: Secondary | ICD-10-CM | POA: Diagnosis not present

## 2021-09-08 DIAGNOSIS — M791 Myalgia, unspecified site: Secondary | ICD-10-CM | POA: Diagnosis not present

## 2021-09-08 DIAGNOSIS — M9902 Segmental and somatic dysfunction of thoracic region: Secondary | ICD-10-CM | POA: Diagnosis not present

## 2021-09-08 DIAGNOSIS — M9903 Segmental and somatic dysfunction of lumbar region: Secondary | ICD-10-CM | POA: Diagnosis not present

## 2021-09-08 DIAGNOSIS — M542 Cervicalgia: Secondary | ICD-10-CM | POA: Diagnosis not present

## 2021-09-08 DIAGNOSIS — M5459 Other low back pain: Secondary | ICD-10-CM | POA: Diagnosis not present

## 2021-09-08 DIAGNOSIS — M9901 Segmental and somatic dysfunction of cervical region: Secondary | ICD-10-CM | POA: Diagnosis not present

## 2021-09-08 DIAGNOSIS — M546 Pain in thoracic spine: Secondary | ICD-10-CM | POA: Diagnosis not present

## 2021-09-08 DIAGNOSIS — R293 Abnormal posture: Secondary | ICD-10-CM | POA: Diagnosis not present

## 2021-09-09 DIAGNOSIS — E7800 Pure hypercholesterolemia, unspecified: Secondary | ICD-10-CM | POA: Diagnosis not present

## 2021-09-09 DIAGNOSIS — E039 Hypothyroidism, unspecified: Secondary | ICD-10-CM | POA: Diagnosis not present

## 2021-09-09 DIAGNOSIS — I10 Essential (primary) hypertension: Secondary | ICD-10-CM | POA: Diagnosis not present

## 2021-09-09 DIAGNOSIS — R7989 Other specified abnormal findings of blood chemistry: Secondary | ICD-10-CM | POA: Diagnosis not present

## 2021-09-09 DIAGNOSIS — R7301 Impaired fasting glucose: Secondary | ICD-10-CM | POA: Diagnosis not present

## 2021-09-10 DIAGNOSIS — M5416 Radiculopathy, lumbar region: Secondary | ICD-10-CM | POA: Diagnosis not present

## 2021-09-10 DIAGNOSIS — G629 Polyneuropathy, unspecified: Secondary | ICD-10-CM | POA: Diagnosis not present

## 2021-10-06 DIAGNOSIS — L821 Other seborrheic keratosis: Secondary | ICD-10-CM | POA: Diagnosis not present

## 2021-10-06 DIAGNOSIS — Z85828 Personal history of other malignant neoplasm of skin: Secondary | ICD-10-CM | POA: Diagnosis not present

## 2021-10-06 DIAGNOSIS — L570 Actinic keratosis: Secondary | ICD-10-CM | POA: Diagnosis not present

## 2021-10-06 DIAGNOSIS — C44729 Squamous cell carcinoma of skin of left lower limb, including hip: Secondary | ICD-10-CM | POA: Diagnosis not present

## 2021-10-13 DIAGNOSIS — M5416 Radiculopathy, lumbar region: Secondary | ICD-10-CM | POA: Diagnosis not present

## 2021-10-13 DIAGNOSIS — M19072 Primary osteoarthritis, left ankle and foot: Secondary | ICD-10-CM | POA: Diagnosis not present

## 2021-10-21 DIAGNOSIS — M5451 Vertebrogenic low back pain: Secondary | ICD-10-CM | POA: Diagnosis not present

## 2021-10-23 DIAGNOSIS — M65321 Trigger finger, right index finger: Secondary | ICD-10-CM | POA: Diagnosis not present

## 2021-11-10 DIAGNOSIS — M546 Pain in thoracic spine: Secondary | ICD-10-CM | POA: Diagnosis not present

## 2021-11-10 DIAGNOSIS — M5459 Other low back pain: Secondary | ICD-10-CM | POA: Diagnosis not present

## 2021-11-10 DIAGNOSIS — M542 Cervicalgia: Secondary | ICD-10-CM | POA: Diagnosis not present

## 2021-11-10 DIAGNOSIS — M9902 Segmental and somatic dysfunction of thoracic region: Secondary | ICD-10-CM | POA: Diagnosis not present

## 2021-11-10 DIAGNOSIS — M9903 Segmental and somatic dysfunction of lumbar region: Secondary | ICD-10-CM | POA: Diagnosis not present

## 2021-11-10 DIAGNOSIS — M791 Myalgia, unspecified site: Secondary | ICD-10-CM | POA: Diagnosis not present

## 2021-11-10 DIAGNOSIS — M9901 Segmental and somatic dysfunction of cervical region: Secondary | ICD-10-CM | POA: Diagnosis not present

## 2021-11-10 DIAGNOSIS — R293 Abnormal posture: Secondary | ICD-10-CM | POA: Diagnosis not present

## 2021-11-24 DIAGNOSIS — M9902 Segmental and somatic dysfunction of thoracic region: Secondary | ICD-10-CM | POA: Diagnosis not present

## 2021-11-24 DIAGNOSIS — M791 Myalgia, unspecified site: Secondary | ICD-10-CM | POA: Diagnosis not present

## 2021-11-24 DIAGNOSIS — M5459 Other low back pain: Secondary | ICD-10-CM | POA: Diagnosis not present

## 2021-11-24 DIAGNOSIS — M9903 Segmental and somatic dysfunction of lumbar region: Secondary | ICD-10-CM | POA: Diagnosis not present

## 2021-11-24 DIAGNOSIS — M9901 Segmental and somatic dysfunction of cervical region: Secondary | ICD-10-CM | POA: Diagnosis not present

## 2021-11-24 DIAGNOSIS — M542 Cervicalgia: Secondary | ICD-10-CM | POA: Diagnosis not present

## 2021-11-24 DIAGNOSIS — R293 Abnormal posture: Secondary | ICD-10-CM | POA: Diagnosis not present

## 2021-11-24 DIAGNOSIS — M546 Pain in thoracic spine: Secondary | ICD-10-CM | POA: Diagnosis not present

## 2021-11-25 ENCOUNTER — Encounter: Payer: Self-pay | Admitting: *Deleted

## 2021-11-27 DIAGNOSIS — M65321 Trigger finger, right index finger: Secondary | ICD-10-CM | POA: Diagnosis not present

## 2021-11-28 ENCOUNTER — Encounter: Payer: Self-pay | Admitting: Diagnostic Neuroimaging

## 2021-11-28 ENCOUNTER — Ambulatory Visit (INDEPENDENT_AMBULATORY_CARE_PROVIDER_SITE_OTHER): Payer: Medicare Other | Admitting: Diagnostic Neuroimaging

## 2021-11-28 VITALS — BP 129/68 | HR 69 | Ht 64.5 in | Wt 145.0 lb

## 2021-11-28 DIAGNOSIS — R9389 Abnormal findings on diagnostic imaging of other specified body structures: Secondary | ICD-10-CM | POA: Diagnosis not present

## 2021-11-28 DIAGNOSIS — R799 Abnormal finding of blood chemistry, unspecified: Secondary | ICD-10-CM

## 2021-11-28 DIAGNOSIS — G629 Polyneuropathy, unspecified: Secondary | ICD-10-CM

## 2021-11-28 NOTE — Patient Instructions (Signed)
  NEUROPATHY SYMPTOMS (numbness in feet) - EMG/NCS results are not that impressive for generalized polyneuropathy; although has some symptoms of neuropathy; will proceed with neuropathy labs testing - caution with gait, balance and driving  H/O lumbar fusion / chronic radiculopathy - follow up with ortho clinic re: lumbar spine; consider MRI lumbar spine

## 2021-11-28 NOTE — Progress Notes (Signed)
[facility]  PATIENT: Margaret Guerrero DOB: [birth year]  REFERRING CLINICIAN: Susa Day, MD  HISTORY FROM: patient  REASON FOR VISIT: new consult    HISTORICAL  CHIEF COMPLAINT:  Chief Complaint  Patient presents with   New Patient (Initial Visit)    Rm 6, alone. Pt referred for peripheral neuropathy. Mainly in feet and legs. Wakes up in the morning with cramps in legs.     HISTORY OF PRESENT ILLNESS:   UPDATE (11/28/21, VRP): Since last visit, here for eval of numbness and weakness of feet (right worse than left). Having diff feeling the accelerator in the car. Went to neuropathy clinic, then ortho clinic, then had EMG testing (abnl right tibial motor reponse; other motor responses and sensory responses normal; mild abnl nedle in left lower ext). Here for neuropathy eval.  PRIOR HPI (579): 81 year old female here for evaluation of memory loss.  Patient reports subjective short-term memory loss, difficulty with spelling, difficulty with keeping track of objects and recent events.  In November 2018 patient had family visiting for her husband's birthday party.  When family left she found a book related to "Alzheimer's disease" and thought that a family member had left it as a subtle hint to patient and her husband.  She confronted her family members but none of them have acknowledged that they had left this book for patient.  Since that time patient has read this book.  She is concerned about diagnosis of Alzheimer's disease for herself.  Her father had Alzheimer's disease and died at age 42 years old.  Patient is concerned about dementia for her own husband as well.  Patient is able to take care of all her own ADLs.  Patient requested ApoE allele gene testing; her result came back at e3/e3.   Patient is under significant high stress related to her home situation, consolidating to homes, as well as renovation work.  She also has out-of-town trips planned.  She is not  sleeping well.   REVIEW OF SYSTEMS: Full 14 system review of systems performed and negative with exception of: as per HPI.   ALLERGIES: Allergies  Allergen Reactions   Ciprofloxacin Itching   Darvon [Propoxyphene]    Demerol [Meperidine Hcl]    Doxycycline Itching   Fenofibrate     Muscle aches   Neosporin [Neomycin-Bacitracin Zn-Polymyx]     "it causes cellulitis"   Pravastatin     Muscle cramps    Penicillins Itching and Rash    Has patient had a PCN reaction causing immediate rash, facial/tongue/throat swelling, SOB or lightheadedness with hypotension: No Has patient had a PCN reaction causing severe rash involving mucus membranes or skin necrosis: Unknown Has patient had a PCN reaction that required hospitalization: No Has patient had a PCN reaction occurring within the last 10 years: No If all of the above answers are "NO", then may proceed with Cephalosporin use.     HOME MEDICATIONS: Outpatient Medications Prior to Visit  Medication Sig Dispense Refill   Calcium Carb-Cholecalciferol (CALCIUM 600 + D PO) Take 1 tablet by mouth daily.     cholecalciferol (VITAMIN D) 1000 UNITS tablet Take 1,000 Units by mouth daily.     CINNAMON PO Take 1 capsule by mouth daily. '500mg'$  caps     dicyclomine (BENTYL) 20 MG tablet Take 1 tablet (20 mg total) by mouth 2 (two) times daily. 20 tablet 0   glucosamine-chondroitin 500-400 MG tablet Take 2 tablets by mouth 3 (three) times a week.  HYDROcodone-acetaminophen (NORCO) 7.5-325 MG tablet Take 1-2 tablets by mouth every 4 (four) hours as needed for moderate pain or severe pain. 60 tablet 0   levothyroxine (SYNTHROID, LEVOTHROID) 50 MCG tablet Take 50 mcg by mouth daily before breakfast.     lisinopril (PRINIVIL,ZESTRIL) 10 MG tablet Take 10 mg by mouth daily.     MAGNESIUM PO Take 1 tablet by mouth daily. '500mg'$  caps     Multiple Vitamin (MULTIVITAMIN WITH MINERALS) TABS Take 1 tablet by mouth daily.     mupirocin ointment (BACTROBAN)  2 % Place 1 application into the nose 3 (three) times daily.     Omega-3 Fatty Acids (FISH OIL) 1000 MG CAPS Take 2,000 mg by mouth every evening.     OVER THE COUNTER MEDICATION Take 3 tablets by mouth daily. Heal-N-Soothe Anti inflammatory supplement     polyethylene glycol (MIRALAX / GLYCOLAX) packet Take 17 g by mouth 2 (two) times daily. 14 each 0   Potassium 99 MG TABS Take 99 mg by mouth daily.     Probiotic Product (ALIGN PO) Take 1 tablet by mouth daily.     TURMERIC PO Take 1,000 mg by mouth daily.      vitamin C (ASCORBIC ACID) 250 MG tablet Take 250 mg by mouth daily.     cyclobenzaprine (FLEXERIL) 10 MG tablet Take 10 mg by mouth at bedtime.     metroNIDAZOLE (FLAGYL) 500 MG tablet Take 1 tablet (500 mg total) by mouth 3 (three) times daily. 21 tablet 0   docusate sodium (COLACE) 100 MG capsule Take 1 capsule (100 mg total) by mouth 2 (two) times daily. 10 capsule 0   doxycycline (VIBRAMYCIN) 100 MG capsule Take 1 capsule (100 mg total) by mouth 2 (two) times daily. One po bid x 7 days 14 capsule 0   ferrous sulfate (FERROUSUL) 325 (65 FE) MG tablet Take 1 tablet (325 mg total) by mouth 3 (three) times daily with meals.  3   No facility-administered medications prior to visit.    PAST MEDICAL HISTORY: Past Medical History:  Diagnosis Date   Acute appendicitis s/p lap appy 08/21/2012 08/21/2012   Diagnosis Appendix, Other than Incidental - TRANSMURAL ACUTE APPENDICITIS AND PERIAPPENDICITIS.    Ankle fracture    Arthritis    Cancer ([facility])    skin cancers removed   DDD (degenerative disc disease), cervical    MRI 01   Diverticulitis    and internal hemmorrhoids   History of hiatal hernia 10/2004   Hypercholesteremia    Hypertension    Hypothyroidism    Migraine    occasionally   Osteoarthritis    Peripheral neuropathy    Pre-diabetes    SCC (squamous cell carcinoma)    RLE Dr. Wilhemina Bonito   Seasonal allergies    Spinal stenosis    Dr. Nelva Bush for epidiural injections     PAST SURGICAL HISTORY: Past Surgical History:  Procedure Laterality Date   ANKLE FUSION  2006   APPENDECTOMY     CARPAL TUNNEL RELEASE     right wrist   GANGLION CYST EXCISION     on the left  1964   LAPAROSCOPIC APPENDECTOMY N/A 08/21/2012   Procedure: APPENDECTOMY LAPAROSCOPIC;  Surgeon: Adin Hector, MD;  Location: [facility];  Service: General;  Laterality: N/A;   LAPAROSCOPIC TUBAL LIGATION     MAXIMUM ACCESS (MAS)POSTERIOR LUMBAR INTERBODY FUSION (PLIF) 1 LEVEL N/A 05/11/2014   Procedure: Maximum Access Surgery Posterior Lumbar Interbody Fusion Lumbar four-five, Laminectomy  Lumbar three Lumbar four;  Surgeon: Eustace Moore, MD;  Location: [facility];  Service: Neurosurgery;  Laterality: N/A;   skin cancers     removed   THYROIDECTOMY Left 1978   TONSILLECTOMY     TOTAL KNEE ARTHROPLASTY Right 12/29/2016   Procedure: RIGHT TOTAL KNEE ARTHROPLASTY;  Surgeon: Paralee Cancel, MD;  Location: [facility];  Service: Orthopedics;  Laterality: Right;  70 mins with regional block   TUBAL LIGATION      FAMILY HISTORY: Family History  Problem Relation Age of Onset   Diabetes Mother    Stroke Mother    Alzheimer's disease Father    Cancer Maternal Aunt        bone   Cancer Paternal Aunt        lung   Cancer - Other Sister 35       Incidental cancer found on the appendix.  Appendectomy as a teenager.  No metastatic disease    SOCIAL HISTORY: Social History   Socioeconomic History   Marital status: Married    Spouse name: eugene   Number of children: 3   Years of education: Not on file   Highest education level: Bachelor's degree (e.g., BA, AB, BS)  Occupational History   Not on file  Tobacco Use   Smoking status: Never   Smokeless tobacco: Never  Substance and Sexual Activity   Alcohol use: Yes    Comment: occasionally   Drug use: No   Sexual activity: Yes  Other Topics Concern   Not on file  Social History Narrative   Lives with husband   R handed   Caffeine: 2 pepsi- 2-3  x a week   Social Determinants of Health   Financial Resource Strain: Not on file  Food Insecurity: Not on file  Transportation Needs: Not on file  Physical Activity: Not on file  Stress: Not on file  Social Connections: Not on file  Intimate Partner Violence: Not on file     PHYSICAL EXAM  GENERAL EXAM/CONSTITUTIONAL: Vitals:  Vitals:   11/28/21 1115  BP: 129/68  Pulse: 69  Weight: 145 lb (65.8 kg)  Height: 5' 4.5" (1.638 m)   Body mass index is 24.5 kg/m. Wt Readings from Last 3 Encounters:  11/28/21 145 lb (65.8 kg)  02/19/20 135 lb (61.2 kg)  08/01/18 150 lb (68 kg)   Patient is in no distress; well developed, nourished and groomed; neck is supple  CARDIOVASCULAR: Examination of carotid arteries is normal; no carotid bruits Regular rate and rhythm, no murmurs Examination of peripheral vascular system by observation and palpation is normal  EYES: Ophthalmoscopic exam of optic discs and posterior segments is normal; no papilledema or hemorrhages No results found.   MUSCULOSKELETAL: Gait, strength, tone, movements noted in Neurologic exam below  NEUROLOGIC: MENTAL STATUS:     05/18/2018   11:55 AM  MMSE - Mini Mental State Exam  Orientation to time 5  Orientation to Place 5  Registration 3  Attention/ Calculation 3  Recall 3  Language- name 2 objects 2  Language- repeat 1  Language- follow 3 step command 3  Language- read & follow direction 1  Write a sentence 1  Copy design 0  Total score 27   awake, alert, oriented to person, place and time recent and remote memory intact normal attention and concentration language fluent, comprehension intact, naming intact fund of knowledge appropriate  CRANIAL NERVE:  2nd - no papilledema on fundoscopic exam 2nd, 3rd, 4th, 6th -  pupils equal and reactive to light, visual fields full to confrontation, extraocular muscles intact, no nystagmus 5th - facial sensation symmetric 7th - facial strength  symmetric 8th - hearing intact 9th - palate elevates symmetrically, uvula midline 11th - shoulder shrug symmetric 12th - tongue protrusion midline  MOTOR:  normal bulk and tone, full strength in the BUE, BLE  SENSORY:  normal and symmetric to light touch, temperature, vibration; except DECR IN BOTOM OF FEET TO PP  COORDINATION:  finger-nose-finger, fine finger movements normal  REFLEXES:  deep tendon reflexes TRACE and symmetric; ABSENT AT KNEES AND ANKLES  GAIT/STATION:  narrow based gait     DIAGNOSTIC DATA (LABS, IMAGING, TESTING) - I reviewed patient records, labs, notes, testing and imaging myself where available.  Lab Results  Component Value Date   WBC 14.2 (H) 08/01/2018   HGB 14.3 08/01/2018   HCT 44.4 08/01/2018   MCV 86.5 08/01/2018   PLT 250 08/01/2018      Component Value Date/Time   NA 132 (L) 08/01/2018 1248   K 3.9 08/01/2018 1248   CL 101 08/01/2018 1248   CO2 25 08/01/2018 1248   GLUCOSE 113 (H) 08/01/2018 1248   BUN 19 08/01/2018 1248   CREATININE 0.78 08/01/2018 1248   CALCIUM 9.1 08/01/2018 1248   PROT 7.6 08/01/2018 1248   ALBUMIN 4.4 08/01/2018 1248   AST 23 08/01/2018 1248   ALT 18 08/01/2018 1248   ALKPHOS 62 08/01/2018 1248   BILITOT 0.7 08/01/2018 1248   GFRNONAA >60 08/01/2018 1248   GFRAA >60 08/01/2018 1248   No results found for: "CHOL", "HDL", "LDLCALC", "LDLDIRECT", "TRIG", "CHOLHDL" Lab Results  Component Value Date   HGBA1C 5.7 (H) 12/22/2016   No results found for: "VITAMINB12" No results found for: "TSH"     ASSESSMENT AND PLAN  81 y.o. year old female here with   Dx:  1. Neuropathy   2. Abnormal finding of blood chemistry, unspecified   3. Abnormal findings on diagnostic imaging of other specified body structures      PLAN:  NEUROPATHY SYMPTOMS (numbness in feet) - EMG/NCS results are not that impressive for generalized polyneuropathy; has some symptoms of polyneuropathy; will proceed with neuropathy  labs testing - caution with gait, balance and driving  H/O lumbar fusion / chronic radiculopathy - follow up with ortho clinic re: lumbar spine; consider MRI lumbar spine  MEMORY LOSS (mild since 2020) - subjective short-term memory loss and confusion and losing objects, with no change in ADLs.  Also with high stress levels and insomnia.  Also with ApoE gene test results e3/e3.  - Most likely represents normal age-related memory complaints vs mild cognitive impairment.  Her gene test results do not confer an increased or decreased risk for Alzheimer's dementia.   - safety / supervision issues reviewed - daily physical activity / exercise (at least 15-30 minutes) - eat more plants / vegetables - increase social activities, brain stimulation, games, puzzles, hobbies, crafts, arts, music - aim for at least 7-8 hours sleep per night (or more) - avoid smoking and alcohol - caregiver resources provided - caution with medications, finances, driving   Orders Placed This Encounter  Procedures   CBC with diff   CMP   Vitamin B12   A1c   TSH   SPEP with IFE   ANA w/Reflex   SSA, SSB   Vitamin B6   Vitamin B1   ANCA Profile   Return for pending if symptoms worsen or fail to  improve, pending test results.    Penni Bombard, MD 05/03/3843, 36:46 AM Certified in Neurology, Neurophysiology and Neuroimaging  [facility] [address], [place], [place] [address] [phone]

## 2021-12-01 DIAGNOSIS — Z1231 Encounter for screening mammogram for malignant neoplasm of breast: Secondary | ICD-10-CM | POA: Diagnosis not present

## 2021-12-05 LAB — COMPREHENSIVE METABOLIC PANEL
ALT: 15 IU/L (ref 0–32)
AST: 24 IU/L (ref 0–40)
Albumin/Globulin Ratio: 1.6 (ref 1.2–2.2)
Albumin: 4.6 g/dL (ref 3.7–4.7)
Alkaline Phosphatase: 80 IU/L (ref 44–121)
BUN/Creatinine Ratio: 20 (ref 12–28)
BUN: 17 mg/dL (ref 8–27)
Bilirubin Total: 0.3 mg/dL (ref 0.0–1.2)
CO2: 20 mmol/L (ref 20–29)
Calcium: 10 mg/dL (ref 8.7–10.3)
Chloride: 97 mmol/L (ref 96–106)
Creatinine, Ser: 0.86 mg/dL (ref 0.57–1.00)
Globulin, Total: 2.8 g/dL (ref 1.5–4.5)
Glucose: 107 mg/dL — ABNORMAL HIGH (ref 70–99)
Potassium: 4.5 mmol/L (ref 3.5–5.2)
Sodium: 134 mmol/L (ref 134–144)
Total Protein: 7.4 g/dL (ref 6.0–8.5)
eGFR: 68 mL/min/{1.73_m2} (ref >59)

## 2021-12-05 LAB — CBC WITH DIFFERENTIAL/PLATELET
Basophils Absolute: 0.1 10*3/uL (ref 0.0–0.2)
Basos: 1 %
EOS (ABSOLUTE): 0.2 10*3/uL (ref 0.0–0.4)
Eos: 2 %
Hematocrit: 40.3 % (ref 34.0–46.6)
Hemoglobin: 13.8 g/dL (ref 11.1–15.9)
Immature Grans (Abs): 0 10*3/uL (ref 0.0–0.1)
Immature Granulocytes: 0 %
Lymphocytes Absolute: 1.3 10*3/uL (ref 0.7–3.1)
Lymphs: 15 %
MCH: 28.5 pg (ref 26.6–33.0)
MCHC: 34.2 g/dL (ref 31.5–35.7)
MCV: 83 fL (ref 79–97)
Monocytes Absolute: 0.5 10*3/uL (ref 0.1–0.9)
Monocytes: 5 %
Neutrophils Absolute: 6.6 10*3/uL (ref 1.4–7.0)
Neutrophils: 77 %
Platelets: 265 10*3/uL (ref 150–450)
RBC: 4.84 x10E6/uL (ref 3.77–5.28)
RDW: 13.5 % (ref 11.7–15.4)
WBC: 8.6 10*3/uL (ref 3.4–10.8)

## 2021-12-05 LAB — MULTIPLE MYELOMA PANEL, SERUM
Albumin SerPl Elph-Mcnc: 4.2 g/dL (ref 2.9–4.4)
Albumin/Glob SerPl: 1.4 (ref 0.7–1.7)
Alpha 1: 0.2 g/dL (ref 0.0–0.4)
Alpha2 Glob SerPl Elph-Mcnc: 0.8 g/dL (ref 0.4–1.0)
B-Globulin SerPl Elph-Mcnc: 1 g/dL (ref 0.7–1.3)
Gamma Glob SerPl Elph-Mcnc: 1.1 g/dL (ref 0.4–1.8)
Globulin, Total: 3.2 g/dL (ref 2.2–3.9)
IgA/Immunoglobulin A, Serum: 152 mg/dL (ref 64–422)
IgG (Immunoglobin G), Serum: 1256 mg/dL (ref 586–1602)
IgM (Immunoglobulin M), Srm: 117 mg/dL (ref 26–217)
M Protein SerPl Elph-Mcnc: 0.4 g/dL — ABNORMAL HIGH

## 2021-12-05 LAB — ANA W/REFLEX: ANA Titer 1: NEGATIVE

## 2021-12-05 LAB — ANCA PROFILE
Anti-MPO Antibodies: <0.2 units (ref 0.0–0.9)
Anti-PR3 Antibodies: <0.2 units (ref 0.0–0.9)
Atypical pANCA: <1:20 {titer}
C-ANCA: <1:20 {titer}
P-ANCA: <1:20 {titer}

## 2021-12-05 LAB — HEMOGLOBIN A1C
Est. average glucose Bld gHb Est-mCnc: 120 mg/dL
Hgb A1c MFr Bld: 5.8 % — ABNORMAL HIGH (ref 4.8–5.6)

## 2021-12-05 LAB — VITAMIN B1: Thiamine: 185.1 nmol/L (ref 66.5–200.0)

## 2021-12-05 LAB — VITAMIN B12: Vitamin B-12: 1268 pg/mL — ABNORMAL HIGH (ref 232–1245)

## 2021-12-05 LAB — SJOGREN'S SYNDROME ANTIBODS(SSA + SSB)
ENA SSA (RO) Ab: <0.2 AI (ref 0.0–0.9)
ENA SSB (LA) Ab: <0.2 AI (ref 0.0–0.9)

## 2021-12-05 LAB — VITAMIN B6: Vitamin B6: 120.6 ug/L — ABNORMAL HIGH (ref 3.4–65.2)

## 2021-12-05 LAB — TSH: TSH: 1.27 u[IU]/mL (ref 0.450–4.500)

## 2021-12-15 ENCOUNTER — Telehealth: Payer: Self-pay | Admitting: Diagnostic Neuroimaging

## 2021-12-15 DIAGNOSIS — D472 Monoclonal gammopathy: Secondary | ICD-10-CM

## 2021-12-15 DIAGNOSIS — M791 Myalgia, unspecified site: Secondary | ICD-10-CM | POA: Diagnosis not present

## 2021-12-15 DIAGNOSIS — M542 Cervicalgia: Secondary | ICD-10-CM | POA: Diagnosis not present

## 2021-12-15 DIAGNOSIS — M5459 Other low back pain: Secondary | ICD-10-CM | POA: Diagnosis not present

## 2021-12-15 DIAGNOSIS — R293 Abnormal posture: Secondary | ICD-10-CM | POA: Diagnosis not present

## 2021-12-15 DIAGNOSIS — M546 Pain in thoracic spine: Secondary | ICD-10-CM | POA: Diagnosis not present

## 2021-12-15 DIAGNOSIS — M9903 Segmental and somatic dysfunction of lumbar region: Secondary | ICD-10-CM | POA: Diagnosis not present

## 2021-12-15 DIAGNOSIS — G629 Polyneuropathy, unspecified: Secondary | ICD-10-CM

## 2021-12-15 DIAGNOSIS — M9901 Segmental and somatic dysfunction of cervical region: Secondary | ICD-10-CM | POA: Diagnosis not present

## 2021-12-15 DIAGNOSIS — M9902 Segmental and somatic dysfunction of thoracic region: Secondary | ICD-10-CM | POA: Diagnosis not present

## 2021-12-15 NOTE — Telephone Encounter (Signed)
Pt following up on blood work results. Have not received a phone call.

## 2021-12-15 NOTE — Telephone Encounter (Signed)
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

## 2021-12-15 NOTE — Telephone Encounter (Signed)
-----   Message from Penni Bombard, MD sent at 12/15/2021 10:45 AM EDT ----- Vit B6 is elevated (consider to hold off on B6 supplements). Also borderline M-protein detected, which can sometimes cause neuropathy. 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 Please call patient. -VRP

## 2021-12-19 NOTE — Telephone Encounter (Signed)
Orders Placed This Encounter  Procedures   Ambulatory referral to Hematology / Oncology    Penni Bombard, MD 5/00/9381, 82:99 AM Certified in Neurology, Neurophysiology and Neuroimaging  [facility] [address] [place], [place] [address] [phone]

## 2021-12-22 ENCOUNTER — Telehealth: Payer: Self-pay | Admitting: Hematology

## 2021-12-22 NOTE — Telephone Encounter (Signed)
Scheduled appt per 8/25 referral. Pt is aware of appt date and time. Pt is aware to arrive 15 mins prior to appt time and to bring and updated insurance card. Pt is aware of appt location.   

## 2021-12-31 DIAGNOSIS — M25561 Pain in right knee: Secondary | ICD-10-CM | POA: Diagnosis not present

## 2021-12-31 DIAGNOSIS — T8484XA Pain due to internal orthopedic prosthetic devices, implants and grafts, initial encounter: Secondary | ICD-10-CM | POA: Diagnosis not present

## 2021-12-31 DIAGNOSIS — Z96651 Presence of right artificial knee joint: Secondary | ICD-10-CM | POA: Diagnosis not present

## 2022-01-05 DIAGNOSIS — R293 Abnormal posture: Secondary | ICD-10-CM | POA: Diagnosis not present

## 2022-01-05 DIAGNOSIS — M791 Myalgia, unspecified site: Secondary | ICD-10-CM | POA: Diagnosis not present

## 2022-01-05 DIAGNOSIS — M9902 Segmental and somatic dysfunction of thoracic region: Secondary | ICD-10-CM | POA: Diagnosis not present

## 2022-01-05 DIAGNOSIS — M542 Cervicalgia: Secondary | ICD-10-CM | POA: Diagnosis not present

## 2022-01-05 DIAGNOSIS — M5459 Other low back pain: Secondary | ICD-10-CM | POA: Diagnosis not present

## 2022-01-05 DIAGNOSIS — M9903 Segmental and somatic dysfunction of lumbar region: Secondary | ICD-10-CM | POA: Diagnosis not present

## 2022-01-05 DIAGNOSIS — M9901 Segmental and somatic dysfunction of cervical region: Secondary | ICD-10-CM | POA: Diagnosis not present

## 2022-01-05 DIAGNOSIS — M546 Pain in thoracic spine: Secondary | ICD-10-CM | POA: Diagnosis not present

## 2022-01-06 DIAGNOSIS — L089 Local infection of the skin and subcutaneous tissue, unspecified: Secondary | ICD-10-CM | POA: Diagnosis not present

## 2022-01-06 DIAGNOSIS — S81801A Unspecified open wound, right lower leg, initial encounter: Secondary | ICD-10-CM | POA: Diagnosis not present

## 2022-01-13 ENCOUNTER — Telehealth: Payer: Self-pay | Admitting: Hematology and Oncology

## 2022-01-13 NOTE — Telephone Encounter (Signed)
R/s pt's appt time. Pt is aware.  

## 2022-01-21 DIAGNOSIS — S81801D Unspecified open wound, right lower leg, subsequent encounter: Secondary | ICD-10-CM | POA: Diagnosis not present

## 2022-01-21 DIAGNOSIS — Z6825 Body mass index (BMI) 25.0-25.9, adult: Secondary | ICD-10-CM | POA: Diagnosis not present

## 2022-01-21 DIAGNOSIS — I10 Essential (primary) hypertension: Secondary | ICD-10-CM | POA: Diagnosis not present

## 2022-01-21 DIAGNOSIS — E039 Hypothyroidism, unspecified: Secondary | ICD-10-CM | POA: Diagnosis not present

## 2022-01-26 ENCOUNTER — Encounter: Payer: Self-pay | Admitting: Hematology and Oncology

## 2022-01-26 ENCOUNTER — Inpatient Hospital Stay: Payer: Medicare Other | Attending: Hematology | Admitting: Hematology and Oncology

## 2022-01-26 ENCOUNTER — Inpatient Hospital Stay: Payer: Medicare Other | Admitting: Hematology

## 2022-01-26 ENCOUNTER — Other Ambulatory Visit: Payer: Self-pay

## 2022-01-26 VITALS — BP 147/64 | HR 69 | Temp 98.7°F | Resp 18 | Ht 64.5 in | Wt 151.8 lb

## 2022-01-26 DIAGNOSIS — M1990 Unspecified osteoarthritis, unspecified site: Secondary | ICD-10-CM | POA: Diagnosis not present

## 2022-01-26 DIAGNOSIS — G609 Hereditary and idiopathic neuropathy, unspecified: Secondary | ICD-10-CM | POA: Diagnosis not present

## 2022-01-26 DIAGNOSIS — D472 Monoclonal gammopathy: Secondary | ICD-10-CM | POA: Insufficient documentation

## 2022-01-26 NOTE — Assessment & Plan Note (Signed)
She has idiopathic neuropathy This is not caused by MGUS The patient is prediabetic and has history of alcohol intake although not excessive I recommend the patient to modify her lifestyle, dietary changes as well as omission of alcohol intake if possible

## 2022-01-26 NOTE — Addendum Note (Signed)
Addended by: Flo Shanks on: 01/26/2022 01:24 PM   Modules accepted: Orders

## 2022-01-26 NOTE — Assessment & Plan Note (Signed)
She has signs of osteoarthritis I do not believe her joint pain is related to MGUS

## 2022-01-26 NOTE — Progress Notes (Signed)
[facility] NOTE  Patient Care Team: Vernie Shanks, MD (Inactive) as PCP - General (Family Medicine) Teena Irani, MD (Inactive) as Consulting Physician (Gastroenterology) Leighton Ruff, MD (Inactive) (Family Medicine)  ASSESSMENT & PLAN:  MGUS (monoclonal gammopathy of unknown significance) We discussed the natural history of MGUS She has no evidence of organ damage such as anemia, hypercalcemia or renal dysfunction Rather than ordering additional work-up now, I recommend we wait 3 months for repeat SPEP along with skeletal survey She is in agreement  Arthritis She has signs of osteoarthritis I do not believe her joint pain is related to MGUS  Idiopathic neuropathy She has idiopathic neuropathy This is not caused by MGUS The patient is prediabetic and has history of alcohol intake although not excessive I recommend the patient to modify her lifestyle, dietary changes as well as omission of alcohol intake if possible  To rule out multiple myeloma, I recommend complete blood work, 24 hour urine collection for UPEP and skeletal survey to rule out multiple myeloma Depending on test results, we may or may not proceed with bone marrow aspirate and biopsy. Orders Placed This Encounter  Procedures   DG Bone Survey Met    Standing Status:   Future    Standing Expiration Date:   01/27/2023    Order Specific Question:   Reason for Exam (SYMPTOM  OR DIAGNOSIS REQUIRED)    Answer:   staging myeloma    Order Specific Question:   Preferred imaging location?    Answer:   [facility] ([place] only)    Standing Status:   Future    Standing Expiration Date:   01/27/2023   CBC with Differential (Cancer Center Only)    Standing Status:   Future    Standing Expiration Date:   01/27/2023   VITAMIN D 25 Hydroxy (Vit-D Deficiency, Fractures)    Standing Status:   Future    Standing Expiration Date:   01/27/2023   Uric acid    Standing Status:   Future     Standing Expiration Date:   01/27/2023   Kappa/lambda light chains    Standing Status:   Future    Standing Expiration Date:   01/27/2023   Multiple Myeloma Panel (SPEP&IFE w/QIG)    Standing Status:   Future    Standing Expiration Date:   01/27/2023   Lactate dehydrogenase    Standing Status:   Future    Standing Expiration Date:   01/27/2023    All questions were answered. The patient knows to call the clinic with any problems, questions or concerns. I spent 55 minutes counseling the patient face to face. The total time spent in the appointment was 55 minutes and more than 50% was on counseling.     Heath Lark, MD 01/26/22 1:10 PM  CHIEF COMPLAINTS/PURPOSE OF CONSULTATION:  MGUS  HISTORY OF PRESENTING ILLNESS:  Margaret Guerrero 81 y.o. female is here because of abnormal serum protein electrophoresis with IgG lambda She was referred to see neurologist due to peripheral neuropathy affecting the bottom of her feet She denies neuropathy in her hands She had extensive work-up with multiple blood draw and was found to have abnormal serum protein electrophoresis and hence is referred here  She denies history of abnormal bone pain or bone fracture.  She has chronic arthritis Patient denies history of recurrent infection or atypical infections such as shingles of meningitis. Denies chills, night sweats, anorexia or abnormal weight loss.  She has sporadic alcohol intake.  She is prediabetic but not on medications for this  MEDICAL HISTORY:  Past Medical History:  Diagnosis Date   Acute appendicitis s/p lap appy 08/21/2012 08/21/2012   Diagnosis Appendix, Other than Incidental - TRANSMURAL ACUTE APPENDICITIS AND PERIAPPENDICITIS.    Allergy    Ankle fracture    Arthritis    Cancer ([place])    skin cancers removed   DDD (degenerative disc disease), cervical    MRI 01   Diverticulitis    and internal hemmorrhoids   History of hiatal hernia 10/2004   Hypercholesteremia    Hypertension     Hypothyroidism    Migraine    occasionally   Osteoarthritis    Peripheral neuropathy    Pre-diabetes    SCC (squamous cell carcinoma)    RLE Dr. Wilhemina Bonito   Seasonal allergies    Spinal stenosis    Dr. Nelva Bush for epidiural injections    SURGICAL HISTORY: Past Surgical History:  Procedure Laterality Date   ANKLE FUSION  2006   APPENDECTOMY     CARPAL TUNNEL RELEASE     right wrist   GANGLION CYST EXCISION     on the left  1964   LAPAROSCOPIC APPENDECTOMY N/A 08/21/2012   Procedure: APPENDECTOMY LAPAROSCOPIC;  Surgeon: Adin Hector, MD;  Location: [facility];  Service: General;  Laterality: N/A;   LAPAROSCOPIC TUBAL LIGATION     MAXIMUM ACCESS (MAS)POSTERIOR LUMBAR INTERBODY FUSION (PLIF) 1 LEVEL N/A 05/11/2014   Procedure: Maximum Access Surgery Posterior Lumbar Interbody Fusion Lumbar four-five, Laminectomy Lumbar three Lumbar four;  Surgeon: Eustace Moore, MD;  Location: [facility];  Service: Neurosurgery;  Laterality: N/A;   skin cancers     removed   THYROIDECTOMY Left 1978   TONSILLECTOMY     TOTAL KNEE ARTHROPLASTY Right 12/29/2016   Procedure: RIGHT TOTAL KNEE ARTHROPLASTY;  Surgeon: Paralee Cancel, MD;  Location: [facility];  Service: Orthopedics;  Laterality: Right;  70 mins with regional block   TUBAL LIGATION      SOCIAL HISTORY: Social History   Socioeconomic History   Marital status: Married    Spouse name: eugene   Number of children: 3   Years of education: Not on file   Highest education level: Bachelor's degree (e.g., BA, AB, BS)  Occupational History   Not on file  Tobacco Use   Smoking status: Never   Smokeless tobacco: Never  Vaping Use   Vaping Use: Never used  Substance and Sexual Activity   Alcohol use: Yes    Comment: occasionally   Drug use: No   Sexual activity: Yes  Other Topics Concern   Not on file  Social History Narrative   Lives with husband   R handed   Caffeine: 2 pepsi- 2-3 x a week   Social Determinants of Health   Financial  Resource Strain: Not on file  Food Insecurity: Not on file  Transportation Needs: Not on file  Physical Activity: Not on file  Stress: Not on file  Social Connections: Not on file  Intimate Partner Violence: Not on file    FAMILY HISTORY: Family History  Problem Relation Age of Onset   Diabetes Mother    Stroke Mother    Alzheimer's disease Father    Cancer Maternal Aunt        bone   Cancer Paternal Aunt        lung   Cancer - Other Sister 64  Incidental cancer found on the appendix.  Appendectomy as a teenager.  No metastatic disease    ALLERGIES:  is allergic to ciprofloxacin, darvon [propoxyphene], demerol [meperidine hcl], doxycycline, fenofibrate, neosporin [neomycin-bacitracin zn-polymyx], pravastatin, and penicillins.  MEDICATIONS:  Current Outpatient Medications  Medication Sig Dispense Refill   Calcium Carb-Cholecalciferol (CALCIUM 600 + D PO) Take 1 tablet by mouth daily.     cholecalciferol (VITAMIN D) 1000 UNITS tablet Take 1,000 Units by mouth daily.     CINNAMON PO Take 1 capsule by mouth daily. 516m caps     dicyclomine (BENTYL) 20 MG tablet Take 1 tablet (20 mg total) by mouth 2 (two) times daily. 20 tablet 0   glucosamine-chondroitin 500-400 MG tablet Take 2 tablets by mouth 3 (three) times a week.      HYDROcodone-acetaminophen (NORCO) 7.5-325 MG tablet Take 1-2 tablets by mouth every 4 (four) hours as needed for moderate pain or severe pain. 60 tablet 0   levothyroxine (SYNTHROID, LEVOTHROID) 50 MCG tablet Take 50 mcg by mouth daily before breakfast.     lisinopril (PRINIVIL,ZESTRIL) 10 MG tablet Take 10 mg by mouth daily.     MAGNESIUM PO Take 1 tablet by mouth daily. 5045mcaps     Multiple Vitamin (MULTIVITAMIN WITH MINERALS) TABS Take 1 tablet by mouth daily.     mupirocin ointment (BACTROBAN) 2 % Place 1 application into the nose 3 (three) times daily.     Omega-3 Fatty Acids (FISH OIL) 1000 MG CAPS Take 2,000 mg by mouth every evening.     OVER  THE COUNTER MEDICATION Take 3 tablets by mouth daily. Heal-N-Soothe Anti inflammatory supplement     polyethylene glycol (MIRALAX / GLYCOLAX) packet Take 17 g by mouth 2 (two) times daily. 14 each 0   Potassium 99 MG TABS Take 99 mg by mouth daily.     Probiotic Product (ALIGN PO) Take 1 tablet by mouth daily.     TURMERIC PO Take 1,000 mg by mouth daily.      vitamin C (ASCORBIC ACID) 250 MG tablet Take 250 mg by mouth daily.     No current facility-administered medications for this visit.    REVIEW OF SYSTEMS:   Eyes: Denies blurriness of vision, double vision or watery eyes Ears, nose, mouth, throat, and face: Denies mucositis or sore throat Respiratory: Denies cough, dyspnea or wheezes Cardiovascular: Denies palpitation, chest discomfort or lower extremity swelling Gastrointestinal:  Denies nausea, heartburn or change in bowel habits Skin: Denies abnormal skin rashes Lymphatics: Denies new lymphadenopathy or easy bruising Behavioral/Psych: Mood is stable, no new changes  All other systems were reviewed with the patient and are negative.  PHYSICAL EXAMINATION: ECOG PERFORMANCE STATUS: 1 - Symptomatic but completely ambulatory  Vitals:   01/26/22 1209  BP: (!) 147/64  Pulse: 69  Resp: 18  Temp: 98.7 F (37.1 C)  SpO2: 98%   Filed Weights   01/26/22 1209  Weight: 151 lb 12.8 oz (68.9 kg)    GENERAL:alert, no distress and comfortable SKIN: Noted skin discoloration on both shin consistent with chronic venous stasis changes.  Multiple scabs from prior injuries EYES: normal, conjunctiva are pink and non-injected, sclera clear OROPHARYNX:no exudate, no erythema and lips, buccal mucosa, and tongue normal  NECK: supple, thyroid normal size, non-tender, without nodularity LYMPH:  no palpable lymphadenopathy in the cervical, axillary or inguinal LUNGS: clear to auscultation and percussion with normal breathing effort HEART: regular rate & rhythm and no murmurs and no lower  extremity edema ABDOMEN:abdomen  soft, non-tender and normal bowel sounds Musculoskeletal:no cyanosis of digits and no clubbing  PSYCH: alert & oriented x 3 with fluent speech NEURO: no focal motor/sensory deficits  LABORATORY DATA:  I have reviewed the data as listed Lab Results  Component Value Date   WBC 8.6 11/28/2021   HGB 13.8 11/28/2021   HCT 40.3 11/28/2021   MCV 83 11/28/2021   PLT 265 11/28/2021

## 2022-01-26 NOTE — Assessment & Plan Note (Signed)
We discussed the natural history of MGUS She has no evidence of organ damage such as anemia, hypercalcemia or renal dysfunction Rather than ordering additional work-up now, I recommend we wait 3 months for repeat SPEP along with skeletal survey She is in agreement

## 2022-01-27 ENCOUNTER — Telehealth: Payer: Self-pay

## 2022-01-27 DIAGNOSIS — S81801D Unspecified open wound, right lower leg, subsequent encounter: Secondary | ICD-10-CM | POA: Diagnosis not present

## 2022-01-27 NOTE — Telephone Encounter (Signed)
Called and given appt for bone survey on 1/9 at 10 am at [facility]. She verbalized understanding.

## 2022-02-04 DIAGNOSIS — R070 Pain in throat: Secondary | ICD-10-CM | POA: Diagnosis not present

## 2022-02-04 DIAGNOSIS — R6884 Jaw pain: Secondary | ICD-10-CM | POA: Diagnosis not present

## 2022-02-04 DIAGNOSIS — Z03818 Encounter for observation for suspected exposure to other biological agents ruled out: Secondary | ICD-10-CM | POA: Diagnosis not present

## 2022-02-04 DIAGNOSIS — J029 Acute pharyngitis, unspecified: Secondary | ICD-10-CM | POA: Diagnosis not present

## 2022-02-16 DIAGNOSIS — M542 Cervicalgia: Secondary | ICD-10-CM | POA: Diagnosis not present

## 2022-02-16 DIAGNOSIS — M791 Myalgia, unspecified site: Secondary | ICD-10-CM | POA: Diagnosis not present

## 2022-02-16 DIAGNOSIS — M9902 Segmental and somatic dysfunction of thoracic region: Secondary | ICD-10-CM | POA: Diagnosis not present

## 2022-02-16 DIAGNOSIS — M9901 Segmental and somatic dysfunction of cervical region: Secondary | ICD-10-CM | POA: Diagnosis not present

## 2022-02-16 DIAGNOSIS — M546 Pain in thoracic spine: Secondary | ICD-10-CM | POA: Diagnosis not present

## 2022-02-16 DIAGNOSIS — M9903 Segmental and somatic dysfunction of lumbar region: Secondary | ICD-10-CM | POA: Diagnosis not present

## 2022-02-16 DIAGNOSIS — R293 Abnormal posture: Secondary | ICD-10-CM | POA: Diagnosis not present

## 2022-02-16 DIAGNOSIS — M5459 Other low back pain: Secondary | ICD-10-CM | POA: Diagnosis not present

## 2022-03-23 DIAGNOSIS — M5459 Other low back pain: Secondary | ICD-10-CM | POA: Diagnosis not present

## 2022-03-23 DIAGNOSIS — M9902 Segmental and somatic dysfunction of thoracic region: Secondary | ICD-10-CM | POA: Diagnosis not present

## 2022-03-23 DIAGNOSIS — M546 Pain in thoracic spine: Secondary | ICD-10-CM | POA: Diagnosis not present

## 2022-03-23 DIAGNOSIS — M9901 Segmental and somatic dysfunction of cervical region: Secondary | ICD-10-CM | POA: Diagnosis not present

## 2022-03-23 DIAGNOSIS — M791 Myalgia, unspecified site: Secondary | ICD-10-CM | POA: Diagnosis not present

## 2022-03-23 DIAGNOSIS — M542 Cervicalgia: Secondary | ICD-10-CM | POA: Diagnosis not present

## 2022-03-23 DIAGNOSIS — M9903 Segmental and somatic dysfunction of lumbar region: Secondary | ICD-10-CM | POA: Diagnosis not present

## 2022-03-23 DIAGNOSIS — R293 Abnormal posture: Secondary | ICD-10-CM | POA: Diagnosis not present

## 2022-04-07 DIAGNOSIS — D692 Other nonthrombocytopenic purpura: Secondary | ICD-10-CM | POA: Diagnosis not present

## 2022-04-07 DIAGNOSIS — C44722 Squamous cell carcinoma of skin of right lower limb, including hip: Secondary | ICD-10-CM | POA: Diagnosis not present

## 2022-04-07 DIAGNOSIS — D1801 Hemangioma of skin and subcutaneous tissue: Secondary | ICD-10-CM | POA: Diagnosis not present

## 2022-04-07 DIAGNOSIS — L821 Other seborrheic keratosis: Secondary | ICD-10-CM | POA: Diagnosis not present

## 2022-04-07 DIAGNOSIS — C44712 Basal cell carcinoma of skin of right lower limb, including hip: Secondary | ICD-10-CM | POA: Diagnosis not present

## 2022-04-07 DIAGNOSIS — Z85828 Personal history of other malignant neoplasm of skin: Secondary | ICD-10-CM | POA: Diagnosis not present

## 2022-04-07 DIAGNOSIS — C44719 Basal cell carcinoma of skin of left lower limb, including hip: Secondary | ICD-10-CM | POA: Diagnosis not present

## 2022-04-07 DIAGNOSIS — L570 Actinic keratosis: Secondary | ICD-10-CM | POA: Diagnosis not present

## 2022-04-07 DIAGNOSIS — D485 Neoplasm of uncertain behavior of skin: Secondary | ICD-10-CM | POA: Diagnosis not present

## 2022-04-07 DIAGNOSIS — D0461 Carcinoma in situ of skin of right upper limb, including shoulder: Secondary | ICD-10-CM | POA: Diagnosis not present

## 2022-05-01 DIAGNOSIS — B351 Tinea unguium: Secondary | ICD-10-CM | POA: Diagnosis not present

## 2022-05-01 DIAGNOSIS — M21611 Bunion of right foot: Secondary | ICD-10-CM | POA: Diagnosis not present

## 2022-05-01 DIAGNOSIS — E1142 Type 2 diabetes mellitus with diabetic polyneuropathy: Secondary | ICD-10-CM | POA: Diagnosis not present

## 2022-05-01 DIAGNOSIS — L851 Acquired keratosis [keratoderma] palmaris et plantaris: Secondary | ICD-10-CM | POA: Diagnosis not present

## 2022-05-05 ENCOUNTER — Other Ambulatory Visit: Payer: Medicare Other

## 2022-05-05 ENCOUNTER — Other Ambulatory Visit (HOSPITAL_COMMUNITY): Payer: Medicare Other

## 2022-05-11 DIAGNOSIS — C44712 Basal cell carcinoma of skin of right lower limb, including hip: Secondary | ICD-10-CM | POA: Diagnosis not present

## 2022-05-11 DIAGNOSIS — Z85828 Personal history of other malignant neoplasm of skin: Secondary | ICD-10-CM | POA: Diagnosis not present

## 2022-05-12 ENCOUNTER — Inpatient Hospital Stay: Payer: Medicare Other | Attending: Hematology

## 2022-05-12 ENCOUNTER — Other Ambulatory Visit: Payer: Self-pay

## 2022-05-12 ENCOUNTER — Ambulatory Visit (HOSPITAL_COMMUNITY)
Admission: RE | Admit: 2022-05-12 | Discharge: 2022-05-12 | Disposition: A | Discharge status: Home / Self Care | Payer: Medicare Other | Source: Ambulatory Visit | Attending: Hematology and Oncology | Admitting: Hematology and Oncology

## 2022-05-12 DIAGNOSIS — M1990 Unspecified osteoarthritis, unspecified site: Secondary | ICD-10-CM | POA: Diagnosis not present

## 2022-05-12 DIAGNOSIS — G629 Polyneuropathy, unspecified: Secondary | ICD-10-CM | POA: Insufficient documentation

## 2022-05-12 DIAGNOSIS — Z79890 Hormone replacement therapy (postmenopausal): Secondary | ICD-10-CM | POA: Diagnosis not present

## 2022-05-12 DIAGNOSIS — C9000 Multiple myeloma not having achieved remission: Secondary | ICD-10-CM | POA: Diagnosis not present

## 2022-05-12 DIAGNOSIS — R748 Abnormal levels of other serum enzymes: Secondary | ICD-10-CM | POA: Diagnosis not present

## 2022-05-12 DIAGNOSIS — M47812 Spondylosis without myelopathy or radiculopathy, cervical region: Secondary | ICD-10-CM | POA: Diagnosis not present

## 2022-05-12 DIAGNOSIS — D472 Monoclonal gammopathy: Secondary | ICD-10-CM | POA: Insufficient documentation

## 2022-05-12 DIAGNOSIS — M47816 Spondylosis without myelopathy or radiculopathy, lumbar region: Secondary | ICD-10-CM | POA: Insufficient documentation

## 2022-05-12 DIAGNOSIS — M47814 Spondylosis without myelopathy or radiculopathy, thoracic region: Secondary | ICD-10-CM | POA: Insufficient documentation

## 2022-05-12 DIAGNOSIS — Z79899 Other long term (current) drug therapy: Secondary | ICD-10-CM | POA: Diagnosis not present

## 2022-05-12 LAB — CMP (CANCER CENTER ONLY)
ALT: 18 U/L (ref 0–44)
AST: 26 U/L (ref 15–41)
Albumin: 4.5 g/dL (ref 3.5–5.0)
Alkaline Phosphatase: 57 U/L (ref 38–126)
Anion gap: 7 (ref 5–15)
BUN: 16 mg/dL (ref 8–23)
CO2: 31 mmol/L (ref 22–32)
Calcium: 10.2 mg/dL (ref 8.9–10.3)
Chloride: 100 mmol/L (ref 98–111)
Creatinine: 0.83 mg/dL (ref 0.44–1.00)
GFR, Estimated: >60 mL/min (ref >60)
Glucose, Bld: 96 mg/dL (ref 70–99)
Potassium: 4 mmol/L (ref 3.5–5.1)
Sodium: 138 mmol/L (ref 135–145)
Total Bilirubin: 0.6 mg/dL (ref 0.3–1.2)
Total Protein: 7.7 g/dL (ref 6.5–8.1)

## 2022-05-12 LAB — CBC WITH DIFFERENTIAL (CANCER CENTER ONLY)
Abs Immature Granulocytes: 0.01 10*3/uL (ref 0.00–0.07)
Basophils Absolute: 0.1 10*3/uL (ref 0.0–0.1)
Basophils Relative: 1 %
Eosinophils Absolute: 0.2 10*3/uL (ref 0.0–0.5)
Eosinophils Relative: 3 %
HCT: 42.4 % (ref 36.0–46.0)
Hemoglobin: 14.3 g/dL (ref 12.0–15.0)
Immature Granulocytes: 0 %
Lymphocytes Relative: 29 %
Lymphs Abs: 1.5 10*3/uL (ref 0.7–4.0)
MCH: 28.4 pg (ref 26.0–34.0)
MCHC: 33.7 g/dL (ref 30.0–36.0)
MCV: 84.3 fL (ref 80.0–100.0)
Monocytes Absolute: 0.3 10*3/uL (ref 0.1–1.0)
Monocytes Relative: 6 %
Neutro Abs: 3.2 10*3/uL (ref 1.7–7.7)
Neutrophils Relative %: 61 %
Platelet Count: 254 10*3/uL (ref 150–400)
RBC: 5.03 MIL/uL (ref 3.87–5.11)
RDW: 13.6 % (ref 11.5–15.5)
WBC Count: 5.3 10*3/uL (ref 4.0–10.5)
nRBC: 0 % (ref 0.0–0.2)

## 2022-05-12 LAB — URIC ACID: Uric Acid, Serum: 5 mg/dL (ref 2.5–7.1)

## 2022-05-12 LAB — LACTATE DEHYDROGENASE: LDH: 170 U/L (ref 98–192)

## 2022-05-12 LAB — VITAMIN D 25 HYDROXY (VIT D DEFICIENCY, FRACTURES): Vit D, 25-Hydroxy: 66.79 ng/mL (ref 30–100)

## 2022-05-13 LAB — KAPPA/LAMBDA LIGHT CHAINS
Kappa free light chain: 21.4 mg/L — ABNORMAL HIGH (ref 3.3–19.4)
Kappa, lambda light chain ratio: 1.63 (ref 0.26–1.65)
Lambda free light chains: 13.1 mg/L (ref 5.7–26.3)

## 2022-05-14 DIAGNOSIS — M65321 Trigger finger, right index finger: Secondary | ICD-10-CM | POA: Diagnosis not present

## 2022-05-15 ENCOUNTER — Encounter: Payer: Self-pay | Admitting: Hematology and Oncology

## 2022-05-15 ENCOUNTER — Other Ambulatory Visit: Payer: Self-pay

## 2022-05-15 ENCOUNTER — Inpatient Hospital Stay (HOSPITAL_BASED_OUTPATIENT_CLINIC_OR_DEPARTMENT_OTHER): Payer: Medicare Other | Admitting: Hematology and Oncology

## 2022-05-15 VITALS — BP 138/72 | HR 8 | Temp 97.5°F | Resp 18 | Ht 64.5 in | Wt 152.8 lb

## 2022-05-15 DIAGNOSIS — D472 Monoclonal gammopathy: Secondary | ICD-10-CM | POA: Diagnosis not present

## 2022-05-15 DIAGNOSIS — G629 Polyneuropathy, unspecified: Secondary | ICD-10-CM | POA: Diagnosis not present

## 2022-05-15 DIAGNOSIS — M1990 Unspecified osteoarthritis, unspecified site: Secondary | ICD-10-CM | POA: Diagnosis not present

## 2022-05-15 DIAGNOSIS — M47816 Spondylosis without myelopathy or radiculopathy, lumbar region: Secondary | ICD-10-CM | POA: Diagnosis not present

## 2022-05-15 DIAGNOSIS — R748 Abnormal levels of other serum enzymes: Secondary | ICD-10-CM | POA: Diagnosis not present

## 2022-05-15 DIAGNOSIS — M47814 Spondylosis without myelopathy or radiculopathy, thoracic region: Secondary | ICD-10-CM | POA: Diagnosis not present

## 2022-05-15 NOTE — Progress Notes (Signed)
[facility] OFFICE PROGRESS NOTE  Patient Care Team: Vernie Shanks, MD (Inactive) as PCP - General (Family Medicine) Teena Irani, MD (Inactive) as Consulting Physician (Gastroenterology) Leighton Ruff, MD (Inactive) (Family Medicine)  ASSESSMENT & PLAN:  MGUS (monoclonal gammopathy of unknown significance) We discussed the natural history of MGUS She has no evidence of organ damage such as anemia, hypercalcemia or renal dysfunction SPEP is still pending I will call her with test results I will see her once a year  Orders Placed This Encounter  Procedures   CMP ([facility] only)    Standing Status:   Future    Standing Expiration Date:   05/16/2023   CBC with Differential (Cancer Center Only)    Standing Status:   Future    Standing Expiration Date:   05/16/2023   Kappa/lambda light chains    Standing Status:   Future    Standing Expiration Date:   05/16/2023   Multiple Myeloma Panel (SPEP&IFE w/QIG)    Standing Status:   Future    Standing Expiration Date:   05/16/2023    All questions were answered. The patient knows to call the clinic with any problems, questions or concerns. The total time spent in the appointment was 20 minutes encounter with patients including review of chart and various tests results, discussions about plan of care and coordination of care plan   Heath Lark, MD 05/15/2022 12:49 PM  INTERVAL HISTORY: Please see below for problem oriented charting. she returns for review of test results Her appointment was rescheduled recently She just completed blood work and x-ray 3 days ago She have no new symptoms We spent majority of our time reviewing test results  REVIEW OF SYSTEMS:   Constitutional: Denies fevers, chills or abnormal weight loss Eyes: Denies blurriness of vision Ears, nose, mouth, throat, and face: Denies mucositis or sore throat Respiratory: Denies cough, dyspnea or wheezes Cardiovascular: Denies palpitation, chest  discomfort or lower extremity swelling Gastrointestinal:  Denies nausea, heartburn or change in bowel habits Skin: Denies abnormal skin rashes Lymphatics: Denies new lymphadenopathy or easy bruising Neurological:Denies numbness, tingling or new weaknesses Behavioral/Psych: Mood is stable, no new changes  All other systems were reviewed with the patient and are negative.  I have reviewed the past medical history, past surgical history, social history and family history with the patient and they are unchanged from previous note.  ALLERGIES:  is allergic to ciprofloxacin, darvon [propoxyphene], demerol [meperidine hcl], doxycycline, fenofibrate, neosporin [neomycin-bacitracin zn-polymyx], pravastatin, and penicillins.  MEDICATIONS:  Current Outpatient Medications  Medication Sig Dispense Refill   Calcium Carb-Cholecalciferol (CALCIUM 600 + D PO) Take 1 tablet by mouth daily.     cholecalciferol (VITAMIN D) 1000 UNITS tablet Take 1,000 Units by mouth daily.     CINNAMON PO Take 1 capsule by mouth daily. '500mg'$  caps/ 2-3 capsules a day     cyclobenzaprine (FLEXERIL) 10 MG tablet Take 10 mg by mouth daily as needed.     levothyroxine (SYNTHROID, LEVOTHROID) 50 MCG tablet Take 50 mcg by mouth daily before breakfast.     lisinopril (PRINIVIL,ZESTRIL) 10 MG tablet Take 10 mg by mouth daily.     Magnesium 500 MG CAPS Take 500 mg by mouth daily.     MAGNESIUM PO Take 1 tablet by mouth daily. '500mg'$  caps     Multiple Vitamin (MULTIVITAMIN WITH MINERALS) TABS Take 1 tablet by mouth daily.     Multiple Vitamins-Minerals (IMMUNE SUPPORT PO) Take 1 capsule by mouth daily.  Vitamin D, Vitamin C and Zinc     mupirocin ointment (BACTROBAN) 2 % Place 1 application into the nose 3 (three) times daily.     Omega-3 Fatty Acids (FISH OIL) 1000 MG CAPS Take 2,000 mg by mouth every evening.     Probiotic Product (ALIGN PO) Take 1 tablet by mouth daily.     TURMERIC PO Take 1,000 mg by mouth daily.      vitamin C  (ASCORBIC ACID) 250 MG tablet Take 250 mg by mouth daily.     No current facility-administered medications for this visit.    SUMMARY OF ONCOLOGIC HISTORY: She was referred to see neurologist due to peripheral neuropathy affecting the bottom of her feet She denies neuropathy in her hands She had extensive work-up with multiple blood draw and was found to have abnormal serum protein electrophoresis and hence is referred here  She denies history of abnormal bone pain or bone fracture.  She has chronic arthritis Patient denies history of recurrent infection or atypical infections such as shingles of meningitis. Denies chills, night sweats, anorexia or abnormal weight loss. Repeat blood work in January 2024 show minimum disease involvement.  Overall, her presentation is consistent with MGUS, IgG lambda subtype  PHYSICAL EXAMINATION: ECOG PERFORMANCE STATUS: 0 - Asymptomatic  Vitals:   05/15/22 1011  BP: 138/72  Pulse: (!) 8  Resp: 18  Temp: (!) 97.5 F (36.4 C)  SpO2: 100%   Filed Weights   05/15/22 1011  Weight: 152 lb 12.8 oz (69.3 kg)    GENERAL:alert, no distress and comfortable NEURO: alert & oriented x 3 with fluent speech, no focal motor/sensory deficits  LABORATORY DATA:  I have reviewed the data as listed    Component Value Date/Time   NA 138 05/12/2022 0903   NA 134 11/28/2021 1151   K 4.0 05/12/2022 0903   CL 100 05/12/2022 0903   CO2 31 05/12/2022 0903   GLUCOSE 96 05/12/2022 0903   BUN 16 05/12/2022 0903   BUN 17 11/28/2021 1151   CREATININE 0.83 05/12/2022 0903   CALCIUM 10.2 05/12/2022 0903   PROT 7.7 05/12/2022 0903   PROT 7.4 11/28/2021 1151   ALBUMIN 4.5 05/12/2022 0903   ALBUMIN 4.6 11/28/2021 1151   AST 26 05/12/2022 0903   ALT 18 05/12/2022 0903   ALKPHOS 57 05/12/2022 0903   BILITOT 0.6 05/12/2022 0903   GFRNONAA >60 05/12/2022 0903   GFRAA >60 08/01/2018 1248    No results found for: "SPEP", "UPEP"  Lab Results  Component Value Date    WBC 5.3 05/12/2022   NEUTROABS 3.2 05/12/2022   HGB 14.3 05/12/2022   HCT 42.4 05/12/2022   MCV 84.3 05/12/2022   PLT 254 05/12/2022      Chemistry      Component Value Date/Time   NA 138 05/12/2022 0903   NA 134 11/28/2021 1151   K 4.0 05/12/2022 0903   CL 100 05/12/2022 0903   CO2 31 05/12/2022 0903   BUN 16 05/12/2022 0903   BUN 17 11/28/2021 1151   CREATININE 0.83 05/12/2022 0903      Component Value Date/Time   CALCIUM 10.2 05/12/2022 0903   ALKPHOS 57 05/12/2022 0903   AST 26 05/12/2022 0903   ALT 18 05/12/2022 0903   BILITOT 0.6 05/12/2022 0903       RADIOGRAPHIC STUDIES: I have personally reviewed the radiological images as listed and agreed with the findings in the report. DG Bone Survey Met  Result Date: 05/13/2022  CLINICAL DATA:  Staging myeloma. EXAM: METASTATIC BONE SURVEY COMPARISON:  None Available. FINDINGS: No lytic or destructive lesion is seen within the bones. Degenerative changes are present in the cervical, thoracic, and lumbar spine. Lumbar spinal fusion hardware is noted L4-L5. Total knee arthroplasty changes are present on the right. There is bony deformity of the distal tibia and fibula on the left with hardware at the distal tibia, likely related to old trauma. IMPRESSION: No lytic or destructive lesion in the bones. Electronically Signed   By: Brett Fairy M.D.   On: 05/13/2022 20:44

## 2022-05-15 NOTE — Assessment & Plan Note (Signed)
We discussed the natural history of MGUS She has no evidence of organ damage such as anemia, hypercalcemia or renal dysfunction SPEP is still pending I will call her with test results I will see her once a year

## 2022-05-18 ENCOUNTER — Telehealth: Payer: Self-pay | Admitting: Hematology and Oncology

## 2022-05-18 LAB — MULTIPLE MYELOMA PANEL, SERUM
Albumin SerPl Elph-Mcnc: 4.4 g/dL (ref 2.9–4.4)
Albumin/Glob SerPl: 1.5 (ref 0.7–1.7)
Alpha 1: 0.2 g/dL (ref 0.0–0.4)
Alpha2 Glob SerPl Elph-Mcnc: 0.7 g/dL (ref 0.4–1.0)
B-Globulin SerPl Elph-Mcnc: 1 g/dL (ref 0.7–1.3)
Gamma Glob SerPl Elph-Mcnc: 1.2 g/dL (ref 0.4–1.8)
Globulin, Total: 3.1 g/dL (ref 2.2–3.9)
IgA: 143 mg/dL (ref 64–422)
IgG (Immunoglobin G), Serum: 1189 mg/dL (ref 586–1602)
IgM (Immunoglobulin M), Srm: 116 mg/dL (ref 26–217)
M Protein SerPl Elph-Mcnc: 0.2 g/dL — ABNORMAL HIGH
Total Protein ELP: 7.5 g/dL (ref 6.0–8.5)

## 2022-05-18 NOTE — Telephone Encounter (Signed)
Called patient to schedule f/u. Left voicemail with new appointment information. 

## 2022-05-21 ENCOUNTER — Telehealth: Payer: Self-pay

## 2022-05-21 NOTE — Telephone Encounter (Signed)
-----  Message from Heath Lark, MD sent at 05/21/2022  3:20 PM EST ----- Pls call her, her Myeloma panel looked a bit better that the one ordered by her referring physician I will see her next year as scheduled

## 2022-05-21 NOTE — Telephone Encounter (Signed)
Called and given below message. She verbalized understanding.

## 2022-05-25 DIAGNOSIS — M546 Pain in thoracic spine: Secondary | ICD-10-CM | POA: Diagnosis not present

## 2022-05-25 DIAGNOSIS — M791 Myalgia, unspecified site: Secondary | ICD-10-CM | POA: Diagnosis not present

## 2022-05-25 DIAGNOSIS — M9903 Segmental and somatic dysfunction of lumbar region: Secondary | ICD-10-CM | POA: Diagnosis not present

## 2022-05-25 DIAGNOSIS — M9901 Segmental and somatic dysfunction of cervical region: Secondary | ICD-10-CM | POA: Diagnosis not present

## 2022-05-25 DIAGNOSIS — R293 Abnormal posture: Secondary | ICD-10-CM | POA: Diagnosis not present

## 2022-05-25 DIAGNOSIS — M5459 Other low back pain: Secondary | ICD-10-CM | POA: Diagnosis not present

## 2022-05-25 DIAGNOSIS — M542 Cervicalgia: Secondary | ICD-10-CM | POA: Diagnosis not present

## 2022-05-25 DIAGNOSIS — M9902 Segmental and somatic dysfunction of thoracic region: Secondary | ICD-10-CM | POA: Diagnosis not present

## 2022-06-02 DIAGNOSIS — I10 Essential (primary) hypertension: Secondary | ICD-10-CM | POA: Diagnosis not present

## 2022-06-02 DIAGNOSIS — R7303 Prediabetes: Secondary | ICD-10-CM | POA: Diagnosis not present

## 2022-06-02 DIAGNOSIS — E7800 Pure hypercholesterolemia, unspecified: Secondary | ICD-10-CM | POA: Diagnosis not present

## 2022-06-02 DIAGNOSIS — Z6825 Body mass index (BMI) 25.0-25.9, adult: Secondary | ICD-10-CM | POA: Diagnosis not present

## 2022-06-02 DIAGNOSIS — E039 Hypothyroidism, unspecified: Secondary | ICD-10-CM | POA: Diagnosis not present

## 2022-06-03 IMAGING — DX DG CHEST 1V PORT
1 series · 1 of 1 positions shown · non-contrast
Comparison: 08/27/2015

CLINICAL DATA: [id number] positive

EXAM:
PORTABLE CHEST 1 VIEW

[chest ap]
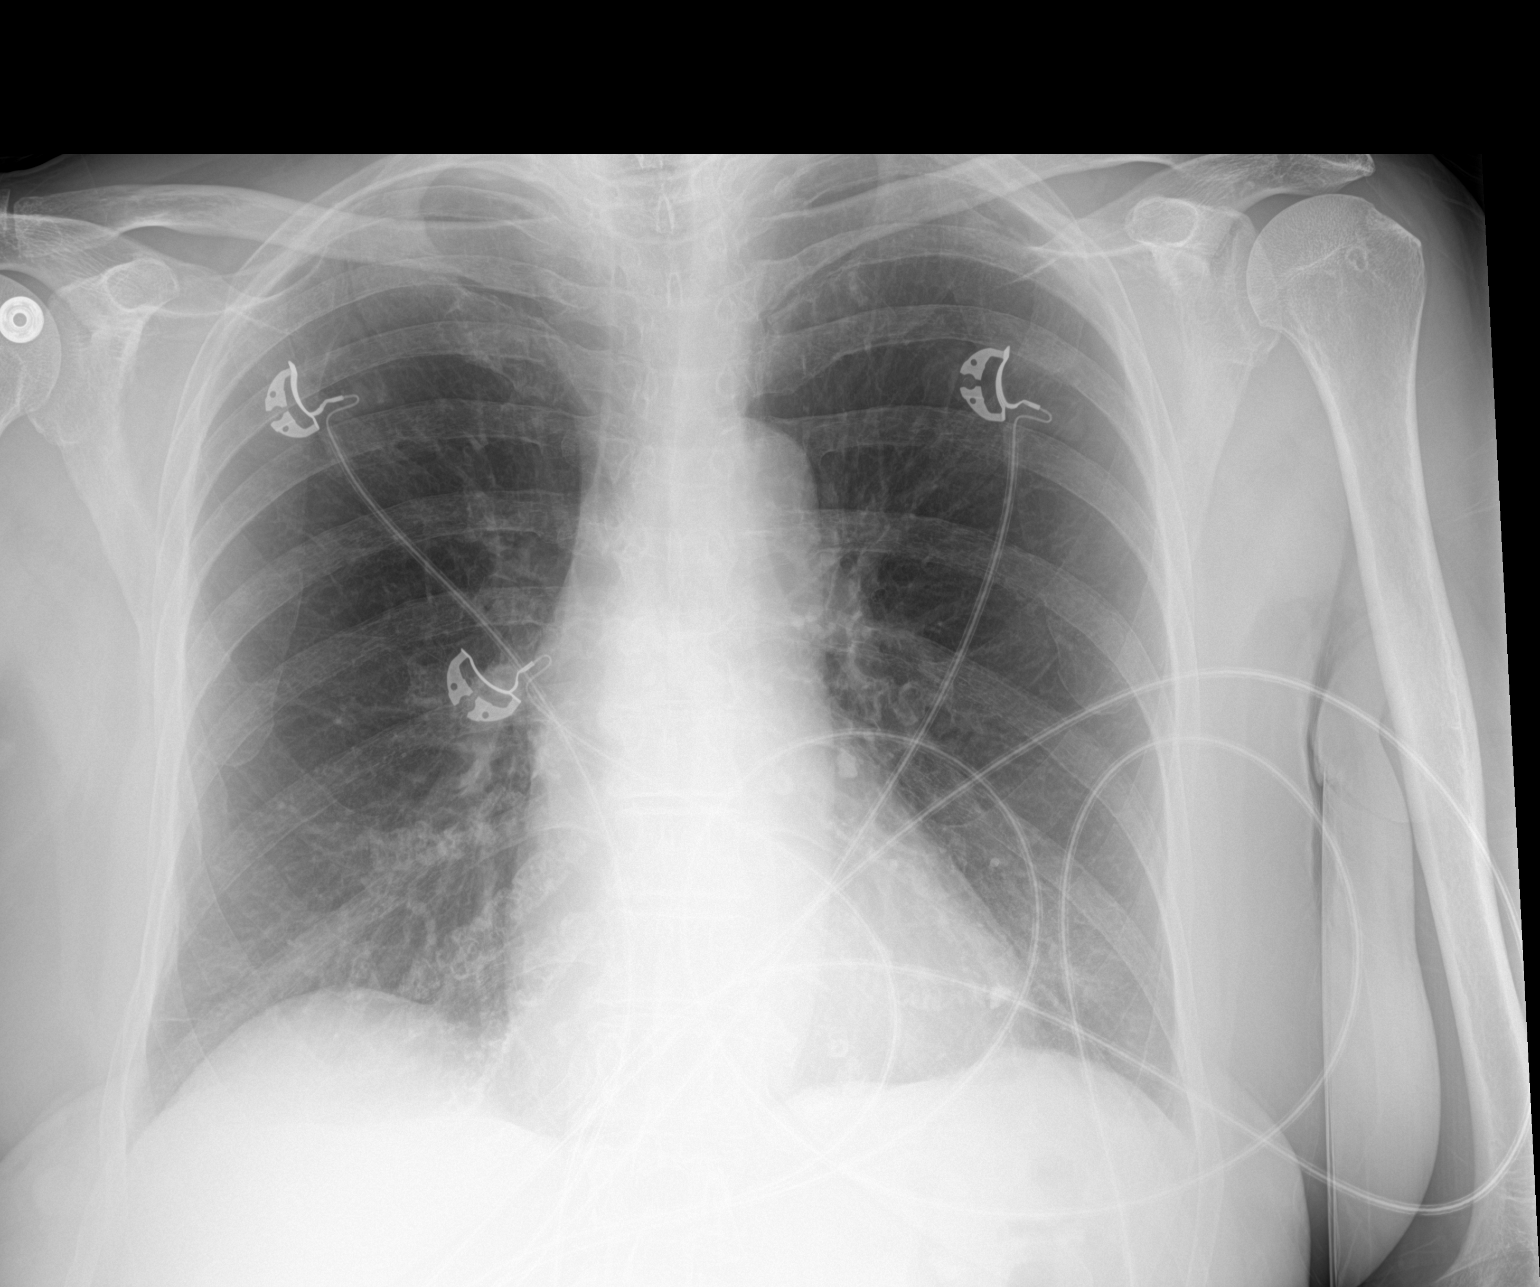

[1 of 1 positions shown; findings below may reference images not displayed]

FINDINGS: The heart size and mediastinal contours are within normal limits. No
focal airspace consolidation, pleural effusion, or pneumothorax. The
visualized skeletal structures are unremarkable.
IMPRESSION: No active disease.

## 2022-06-15 DIAGNOSIS — H35033 Hypertensive retinopathy, bilateral: Secondary | ICD-10-CM | POA: Diagnosis not present

## 2022-08-12 DIAGNOSIS — M549 Dorsalgia, unspecified: Secondary | ICD-10-CM | POA: Diagnosis not present

## 2022-08-12 DIAGNOSIS — I10 Essential (primary) hypertension: Secondary | ICD-10-CM | POA: Diagnosis not present

## 2022-08-12 DIAGNOSIS — E7800 Pure hypercholesterolemia, unspecified: Secondary | ICD-10-CM | POA: Diagnosis not present

## 2022-09-03 DIAGNOSIS — M65341 Trigger finger, right ring finger: Secondary | ICD-10-CM | POA: Diagnosis not present

## 2022-09-03 DIAGNOSIS — M65321 Trigger finger, right index finger: Secondary | ICD-10-CM | POA: Diagnosis not present

## 2022-09-17 DIAGNOSIS — M65321 Trigger finger, right index finger: Secondary | ICD-10-CM | POA: Diagnosis not present

## 2022-09-17 DIAGNOSIS — M65341 Trigger finger, right ring finger: Secondary | ICD-10-CM | POA: Diagnosis not present

## 2022-09-17 DIAGNOSIS — M65322 Trigger finger, left index finger: Secondary | ICD-10-CM | POA: Diagnosis not present

## 2022-10-06 DIAGNOSIS — Z85828 Personal history of other malignant neoplasm of skin: Secondary | ICD-10-CM | POA: Diagnosis not present

## 2022-10-06 DIAGNOSIS — D1801 Hemangioma of skin and subcutaneous tissue: Secondary | ICD-10-CM | POA: Diagnosis not present

## 2022-10-06 DIAGNOSIS — D692 Other nonthrombocytopenic purpura: Secondary | ICD-10-CM | POA: Diagnosis not present

## 2022-10-06 DIAGNOSIS — L570 Actinic keratosis: Secondary | ICD-10-CM | POA: Diagnosis not present

## 2022-10-06 DIAGNOSIS — L821 Other seborrheic keratosis: Secondary | ICD-10-CM | POA: Diagnosis not present

## 2022-10-07 DIAGNOSIS — G629 Polyneuropathy, unspecified: Secondary | ICD-10-CM | POA: Diagnosis not present

## 2022-10-07 DIAGNOSIS — I10 Essential (primary) hypertension: Secondary | ICD-10-CM | POA: Diagnosis not present

## 2022-10-07 DIAGNOSIS — R252 Cramp and spasm: Secondary | ICD-10-CM | POA: Diagnosis not present

## 2022-10-26 DIAGNOSIS — M65321 Trigger finger, right index finger: Secondary | ICD-10-CM | POA: Diagnosis not present

## 2022-10-26 DIAGNOSIS — M65341 Trigger finger, right ring finger: Secondary | ICD-10-CM | POA: Diagnosis not present

## 2022-11-05 DIAGNOSIS — M65321 Trigger finger, right index finger: Secondary | ICD-10-CM | POA: Diagnosis not present

## 2022-12-03 DIAGNOSIS — M65321 Trigger finger, right index finger: Secondary | ICD-10-CM | POA: Diagnosis not present

## 2022-12-03 DIAGNOSIS — M65331 Trigger finger, right middle finger: Secondary | ICD-10-CM | POA: Diagnosis not present

## 2022-12-08 DIAGNOSIS — E2839 Other primary ovarian failure: Secondary | ICD-10-CM | POA: Diagnosis not present

## 2022-12-08 DIAGNOSIS — Z23 Encounter for immunization: Secondary | ICD-10-CM | POA: Diagnosis not present

## 2022-12-08 DIAGNOSIS — R252 Cramp and spasm: Secondary | ICD-10-CM | POA: Diagnosis not present

## 2022-12-08 DIAGNOSIS — Z0000 Encounter for general adult medical examination without abnormal findings: Secondary | ICD-10-CM | POA: Diagnosis not present

## 2022-12-29 DIAGNOSIS — U071 COVID-19: Secondary | ICD-10-CM | POA: Diagnosis not present

## 2023-01-07 DIAGNOSIS — M65321 Trigger finger, right index finger: Secondary | ICD-10-CM | POA: Diagnosis not present

## 2023-01-07 DIAGNOSIS — M65331 Trigger finger, right middle finger: Secondary | ICD-10-CM | POA: Diagnosis not present

## 2023-01-18 DIAGNOSIS — Z780 Asymptomatic menopausal state: Secondary | ICD-10-CM | POA: Diagnosis not present

## 2023-01-18 DIAGNOSIS — E2839 Other primary ovarian failure: Secondary | ICD-10-CM | POA: Diagnosis not present

## 2023-01-28 DIAGNOSIS — N952 Postmenopausal atrophic vaginitis: Secondary | ICD-10-CM | POA: Diagnosis not present

## 2023-01-28 DIAGNOSIS — R350 Frequency of micturition: Secondary | ICD-10-CM | POA: Diagnosis not present

## 2023-01-28 DIAGNOSIS — N816 Rectocele: Secondary | ICD-10-CM | POA: Diagnosis not present

## 2023-01-28 DIAGNOSIS — N3946 Mixed incontinence: Secondary | ICD-10-CM | POA: Diagnosis not present

## 2023-02-02 DIAGNOSIS — N6310 Unspecified lump in the right breast, unspecified quadrant: Secondary | ICD-10-CM | POA: Diagnosis not present

## 2023-02-17 DIAGNOSIS — R92323 Mammographic fibroglandular density, bilateral breasts: Secondary | ICD-10-CM | POA: Diagnosis not present

## 2023-02-17 DIAGNOSIS — N6314 Unspecified lump in the right breast, lower inner quadrant: Secondary | ICD-10-CM | POA: Diagnosis not present

## 2023-02-17 DIAGNOSIS — R92321 Mammographic fibroglandular density, right breast: Secondary | ICD-10-CM | POA: Diagnosis not present

## 2023-03-09 DIAGNOSIS — Z124 Encounter for screening for malignant neoplasm of cervix: Secondary | ICD-10-CM | POA: Diagnosis not present

## 2023-03-09 DIAGNOSIS — Z01419 Encounter for gynecological examination (general) (routine) without abnormal findings: Secondary | ICD-10-CM | POA: Diagnosis not present

## 2023-03-15 DIAGNOSIS — M19041 Primary osteoarthritis, right hand: Secondary | ICD-10-CM | POA: Diagnosis not present

## 2023-04-01 DIAGNOSIS — R1031 Right lower quadrant pain: Secondary | ICD-10-CM | POA: Diagnosis not present

## 2023-04-06 DIAGNOSIS — Z85828 Personal history of other malignant neoplasm of skin: Secondary | ICD-10-CM | POA: Diagnosis not present

## 2023-04-06 DIAGNOSIS — L817 Pigmented purpuric dermatosis: Secondary | ICD-10-CM | POA: Diagnosis not present

## 2023-04-06 DIAGNOSIS — L570 Actinic keratosis: Secondary | ICD-10-CM | POA: Diagnosis not present

## 2023-04-06 DIAGNOSIS — L814 Other melanin hyperpigmentation: Secondary | ICD-10-CM | POA: Diagnosis not present

## 2023-04-06 DIAGNOSIS — L821 Other seborrheic keratosis: Secondary | ICD-10-CM | POA: Diagnosis not present

## 2023-04-08 DIAGNOSIS — R1031 Right lower quadrant pain: Secondary | ICD-10-CM | POA: Diagnosis not present

## 2023-04-08 DIAGNOSIS — I10 Essential (primary) hypertension: Secondary | ICD-10-CM | POA: Diagnosis not present

## 2023-04-08 DIAGNOSIS — E7800 Pure hypercholesterolemia, unspecified: Secondary | ICD-10-CM | POA: Diagnosis not present

## 2023-04-08 DIAGNOSIS — E039 Hypothyroidism, unspecified: Secondary | ICD-10-CM | POA: Diagnosis not present

## 2023-05-03 DIAGNOSIS — E1142 Type 2 diabetes mellitus with diabetic polyneuropathy: Secondary | ICD-10-CM | POA: Diagnosis not present

## 2023-05-03 DIAGNOSIS — B351 Tinea unguium: Secondary | ICD-10-CM | POA: Diagnosis not present

## 2023-05-03 DIAGNOSIS — L851 Acquired keratosis [keratoderma] palmaris et plantaris: Secondary | ICD-10-CM | POA: Diagnosis not present

## 2023-05-03 DIAGNOSIS — L97522 Non-pressure chronic ulcer of other part of left foot with fat layer exposed: Secondary | ICD-10-CM | POA: Diagnosis not present

## 2023-05-06 DIAGNOSIS — M19041 Primary osteoarthritis, right hand: Secondary | ICD-10-CM | POA: Diagnosis not present

## 2023-05-10 ENCOUNTER — Inpatient Hospital Stay: Payer: Federal, State, Local not specified - PPO | Attending: Hematology and Oncology

## 2023-05-10 ENCOUNTER — Telehealth: Payer: Self-pay

## 2023-05-10 DIAGNOSIS — I10 Essential (primary) hypertension: Secondary | ICD-10-CM | POA: Insufficient documentation

## 2023-05-10 DIAGNOSIS — D472 Monoclonal gammopathy: Secondary | ICD-10-CM | POA: Diagnosis not present

## 2023-05-10 DIAGNOSIS — G609 Hereditary and idiopathic neuropathy, unspecified: Secondary | ICD-10-CM | POA: Insufficient documentation

## 2023-05-10 LAB — CBC WITH DIFFERENTIAL (CANCER CENTER ONLY)
Abs Immature Granulocytes: 0.02 10*3/uL (ref 0.00–0.07)
Basophils Absolute: 0.1 10*3/uL (ref 0.0–0.1)
Basophils Relative: 1 %
Eosinophils Absolute: 0.3 10*3/uL (ref 0.0–0.5)
Eosinophils Relative: 3 %
HCT: 41.8 % (ref 36.0–46.0)
Hemoglobin: 13.9 g/dL (ref 12.0–15.0)
Immature Granulocytes: 0 %
Lymphocytes Relative: 17 %
Lymphs Abs: 1.6 10*3/uL (ref 0.7–4.0)
MCH: 28.1 pg (ref 26.0–34.0)
MCHC: 33.3 g/dL (ref 30.0–36.0)
MCV: 84.4 fL (ref 80.0–100.0)
Monocytes Absolute: 0.7 10*3/uL (ref 0.1–1.0)
Monocytes Relative: 8 %
Neutro Abs: 6.9 10*3/uL (ref 1.7–7.7)
Neutrophils Relative %: 71 %
Platelet Count: 284 10*3/uL (ref 150–400)
RBC: 4.95 MIL/uL (ref 3.87–5.11)
RDW: 13.2 % (ref 11.5–15.5)
WBC Count: 9.6 10*3/uL (ref 4.0–10.5)
nRBC: 0 % (ref 0.0–0.2)

## 2023-05-10 LAB — CMP (CANCER CENTER ONLY)
ALT: 15 U/L (ref 0–44)
AST: 20 U/L (ref 15–41)
Albumin: 4.5 g/dL (ref 3.5–5.0)
Alkaline Phosphatase: 58 U/L (ref 38–126)
Anion gap: 7 (ref 5–15)
BUN: 23 mg/dL (ref 8–23)
CO2: 27 mmol/L (ref 22–32)
Calcium: 10 mg/dL (ref 8.9–10.3)
Chloride: 100 mmol/L (ref 98–111)
Creatinine: 0.78 mg/dL (ref 0.44–1.00)
GFR, Estimated: >60 mL/min (ref >60)
Glucose, Bld: 105 mg/dL — ABNORMAL HIGH (ref 70–99)
Potassium: 4.6 mmol/L (ref 3.5–5.1)
Sodium: 134 mmol/L — ABNORMAL LOW (ref 135–145)
Total Bilirubin: 0.5 mg/dL (ref 0.0–1.2)
Total Protein: 7.5 g/dL (ref 6.5–8.1)

## 2023-05-10 NOTE — Telephone Encounter (Signed)
 Called and moved appt to 1/23. She is aware of appt.

## 2023-05-11 LAB — KAPPA/LAMBDA LIGHT CHAINS
Kappa free light chain: 15.1 mg/L (ref 3.3–19.4)
Kappa, lambda light chain ratio: 1.53 (ref 0.26–1.65)
Lambda free light chains: 9.9 mg/L (ref 5.7–26.3)

## 2023-05-12 DIAGNOSIS — K4090 Unilateral inguinal hernia, without obstruction or gangrene, not specified as recurrent: Secondary | ICD-10-CM | POA: Diagnosis not present

## 2023-05-17 LAB — MULTIPLE MYELOMA PANEL, SERUM
Albumin SerPl Elph-Mcnc: 4 g/dL (ref 2.9–4.4)
Albumin/Glob SerPl: 1.4 (ref 0.7–1.7)
Alpha 1: 0.2 g/dL (ref 0.0–0.4)
Alpha2 Glob SerPl Elph-Mcnc: 0.8 g/dL (ref 0.4–1.0)
B-Globulin SerPl Elph-Mcnc: 1 g/dL (ref 0.7–1.3)
Gamma Glob SerPl Elph-Mcnc: 1.1 g/dL (ref 0.4–1.8)
Globulin, Total: 3 g/dL (ref 2.2–3.9)
IgA: 127 mg/dL (ref 64–422)
IgG (Immunoglobin G), Serum: 1180 mg/dL (ref 586–1602)
IgM (Immunoglobulin M), Srm: 100 mg/dL (ref 26–217)
Total Protein ELP: 7 g/dL (ref 6.0–8.5)

## 2023-05-19 ENCOUNTER — Ambulatory Visit: Payer: Medicare Other | Admitting: Hematology and Oncology

## 2023-05-20 ENCOUNTER — Inpatient Hospital Stay (HOSPITAL_BASED_OUTPATIENT_CLINIC_OR_DEPARTMENT_OTHER): Payer: Federal, State, Local not specified - PPO | Admitting: Hematology and Oncology

## 2023-05-20 ENCOUNTER — Encounter: Payer: Self-pay | Admitting: Hematology and Oncology

## 2023-05-20 VITALS — BP 150/66 | HR 71 | Temp 98.4°F | Resp 18 | Ht 64.5 in | Wt 149.8 lb

## 2023-05-20 DIAGNOSIS — G609 Hereditary and idiopathic neuropathy, unspecified: Secondary | ICD-10-CM

## 2023-05-20 DIAGNOSIS — D472 Monoclonal gammopathy: Secondary | ICD-10-CM | POA: Diagnosis not present

## 2023-05-20 DIAGNOSIS — I10 Essential (primary) hypertension: Secondary | ICD-10-CM

## 2023-05-20 NOTE — Assessment & Plan Note (Addendum)
She felt slightly worse It is possible that her symptoms are exacerbated by changes in the weather She has quit alcohol use Monitor closely I do not believe her neuropathy is caused by MGUS

## 2023-05-20 NOTE — Progress Notes (Signed)
[facility] OFFICE PROGRESS NOTE  Patient Care Team: Dorena Cookey, MD (Inactive) as Consulting Physician (Gastroenterology) Juluis Rainier, MD (Inactive) (Family Medicine)  ASSESSMENT & PLAN:  MGUS (monoclonal gammopathy of unknown significance) M protein is undetected this year It is possible that her recent steroid injection to her right hand might have affected the reading Overall, she have no symptoms and her other counts are normal I will continue to see her once a year  Idiopathic neuropathy She felt slightly worse It is possible that her symptoms are exacerbated by changes in the weather She has quit alcohol use Monitor closely I do not believe her neuropathy is caused by MGUS  Hypertension Blood pressure is elevated could be due to anxiety She will continue close monitoring at home  Orders Placed This Encounter  Procedures   Comprehensive metabolic panel    Standing Status:   Standing    Number of Occurrences:   33    Expiration Date:   05/19/2024   CBC with Differential/Platelet    Standing Status:   Standing    Number of Occurrences:   22    Expiration Date:   05/19/2024   Kappa/lambda light chains    Standing Status:   Standing    Number of Occurrences:   22    Expiration Date:   05/19/2024   Multiple Myeloma Panel (SPEP&IFE w/QIG)    Standing Status:   Standing    Number of Occurrences:   22    Expiration Date:   05/19/2024    All questions were answered. The patient knows to call the clinic with any problems, questions or concerns. The total time spent in the appointment was 20 minutes encounter with patients including review of chart and various tests results, discussions about plan of care and coordination of care plan   Artis Delay, MD 05/20/2023 9:01 AM  INTERVAL HISTORY: Please see below for problem oriented charting. she returns for surveillance follow-up for history of MGUS She complained of recent trigger finger She had some form  of surgery in July of last year but it did not help her in the long-term She had another joint injection recently earlier this month It is still bothering her She is also bothered by her peripheral neuropathy We discussed test results and future follow-up  REVIEW OF SYSTEMS:   Constitutional: Denies fevers, chills or abnormal weight loss Eyes: Denies blurriness of vision Ears, nose, mouth, throat, and face: Denies mucositis or sore throat Respiratory: Denies cough, dyspnea or wheezes Cardiovascular: Denies palpitation, chest discomfort or lower extremity swelling Gastrointestinal:  Denies nausea, heartburn or change in bowel habits Skin: Denies abnormal skin rashes Lymphatics: Denies new lymphadenopathy or easy bruising Behavioral/Psych: Mood is stable, no new changes  All other systems were reviewed with the patient and are negative.  I have reviewed the past medical history, past surgical history, social history and family history with the patient and they are unchanged from previous note.  ALLERGIES:  is allergic to ciprofloxacin, darvon [propoxyphene], demerol [meperidine hcl], doxycycline, fenofibrate, neosporin [neomycin-bacitracin zn-polymyx], pravastatin, and penicillins.  MEDICATIONS:  Current Outpatient Medications  Medication Sig Dispense Refill   Calcium Carb-Cholecalciferol (CALCIUM 600 + D PO) Take 1 tablet by mouth daily.     cholecalciferol (VITAMIN D) 1000 UNITS tablet Take 1,000 Units by mouth daily.     CINNAMON PO Take 1 capsule by mouth daily. 500mg  caps/ 2-3 capsules a day     cyclobenzaprine (FLEXERIL) 10 MG tablet Take 10  mg by mouth daily as needed.     levothyroxine (SYNTHROID, LEVOTHROID) 50 MCG tablet Take 50 mcg by mouth daily before breakfast.     lisinopril (PRINIVIL,ZESTRIL) 10 MG tablet Take 10 mg by mouth daily.     Magnesium 500 MG CAPS Take 500 mg by mouth daily.     MAGNESIUM PO Take 1 tablet by mouth daily. 500mg  caps     Multiple Vitamin  (MULTIVITAMIN WITH MINERALS) TABS Take 1 tablet by mouth daily.     Multiple Vitamins-Minerals (IMMUNE SUPPORT PO) Take 1 capsule by mouth daily. Vitamin D, Vitamin C and Zinc     mupirocin ointment (BACTROBAN) 2 % Place 1 application into the nose 3 (three) times daily.     Omega-3 Fatty Acids (FISH OIL) 1000 MG CAPS Take 2,000 mg by mouth every evening.     Probiotic Product (ALIGN PO) Take 1 tablet by mouth daily.     TURMERIC PO Take 1,000 mg by mouth daily.      vitamin C (ASCORBIC ACID) 250 MG tablet Take 250 mg by mouth daily.     No current facility-administered medications for this visit.    SUMMARY OF ONCOLOGIC HISTORY:  She was referred to see neurologist due to peripheral neuropathy affecting the bottom of her feet She denies neuropathy in her hands She had extensive work-up with multiple blood draw and was found to have abnormal serum protein electrophoresis and hence is referred here  She denies history of abnormal bone pain or bone fracture.  She has chronic arthritis Patient denies history of recurrent infection or atypical infections such as shingles of meningitis. Denies chills, night sweats, anorexia or abnormal weight loss. Repeat blood work in January 2024 show minimum disease involvement.  Overall, her presentation is consistent with MGUS, IgG lambda subtype  PHYSICAL EXAMINATION: ECOG PERFORMANCE STATUS: 1 - Symptomatic but completely ambulatory  Vitals:   05/20/23 0841  BP: (!) 150/66  Pulse: 71  Resp: 18  Temp: 98.4 F (36.9 C)  SpO2: 99%   Filed Weights   05/20/23 0841  Weight: 149 lb 12.8 oz (67.9 kg)    GENERAL:alert, no distress and comfortable Musculoskeletal: Noted trigger finger and changes to her right hand consistent with osteoarthritis NEURO: alert & oriented x 3 with fluent speech  LABORATORY DATA:  I have reviewed the data as listed    Component Value Date/Time   NA 134 (L) 05/10/2023 1036   NA 134 11/28/2021 1151   K 4.6  05/10/2023 1036   CL 100 05/10/2023 1036   CO2 27 05/10/2023 1036   GLUCOSE 105 (H) 05/10/2023 1036   BUN 23 05/10/2023 1036   BUN 17 11/28/2021 1151   CREATININE 0.78 05/10/2023 1036   CALCIUM 10.0 05/10/2023 1036   PROT 7.5 05/10/2023 1036   PROT 7.4 11/28/2021 1151   ALBUMIN 4.5 05/10/2023 1036   ALBUMIN 4.6 11/28/2021 1151   AST 20 05/10/2023 1036   ALT 15 05/10/2023 1036   ALKPHOS 58 05/10/2023 1036   BILITOT 0.5 05/10/2023 1036   GFRNONAA >60 05/10/2023 1036   GFRAA >60 08/01/2018 1248    No results found for: "SPEP", "UPEP"  Lab Results  Component Value Date   WBC 9.6 05/10/2023   NEUTROABS 6.9 05/10/2023   HGB 13.9 05/10/2023   HCT 41.8 05/10/2023   MCV 84.4 05/10/2023   PLT 284 05/10/2023      Chemistry      Component Value Date/Time   NA 134 (L) 05/10/2023 1036  NA 134 11/28/2021 1151   K 4.6 05/10/2023 1036   CL 100 05/10/2023 1036   CO2 27 05/10/2023 1036   BUN 23 05/10/2023 1036   BUN 17 11/28/2021 1151   CREATININE 0.78 05/10/2023 1036      Component Value Date/Time   CALCIUM 10.0 05/10/2023 1036   ALKPHOS 58 05/10/2023 1036   AST 20 05/10/2023 1036   ALT 15 05/10/2023 1036   BILITOT 0.5 05/10/2023 1036

## 2023-05-20 NOTE — Assessment & Plan Note (Signed)
Blood pressure is elevated could be due to anxiety She will continue close monitoring at home

## 2023-05-20 NOTE — Assessment & Plan Note (Signed)
M protein is undetected this year It is possible that her recent steroid injection to her right hand might have affected the reading Overall, she have no symptoms and her other counts are normal I will continue to see her once a year

## 2023-05-27 DIAGNOSIS — M65321 Trigger finger, right index finger: Secondary | ICD-10-CM | POA: Diagnosis not present

## 2023-05-27 DIAGNOSIS — M720 Palmar fascial fibromatosis [Dupuytren]: Secondary | ICD-10-CM | POA: Diagnosis not present

## 2023-05-27 DIAGNOSIS — M65331 Trigger finger, right middle finger: Secondary | ICD-10-CM | POA: Diagnosis not present

## 2023-06-01 DIAGNOSIS — W278XXA Contact with other nonpowered hand tool, initial encounter: Secondary | ICD-10-CM | POA: Diagnosis not present

## 2023-06-01 DIAGNOSIS — S81801A Unspecified open wound, right lower leg, initial encounter: Secondary | ICD-10-CM | POA: Diagnosis not present

## 2023-06-01 DIAGNOSIS — L03115 Cellulitis of right lower limb: Secondary | ICD-10-CM | POA: Diagnosis not present

## 2023-06-08 DIAGNOSIS — L089 Local infection of the skin and subcutaneous tissue, unspecified: Secondary | ICD-10-CM | POA: Diagnosis not present

## 2023-06-08 DIAGNOSIS — Z6824 Body mass index (BMI) 24.0-24.9, adult: Secondary | ICD-10-CM | POA: Diagnosis not present

## 2023-06-09 DIAGNOSIS — M65331 Trigger finger, right middle finger: Secondary | ICD-10-CM | POA: Diagnosis not present

## 2023-06-09 DIAGNOSIS — M720 Palmar fascial fibromatosis [Dupuytren]: Secondary | ICD-10-CM | POA: Diagnosis not present

## 2023-06-14 DIAGNOSIS — Z6824 Body mass index (BMI) 24.0-24.9, adult: Secondary | ICD-10-CM | POA: Diagnosis not present

## 2023-06-14 DIAGNOSIS — S81811D Laceration without foreign body, right lower leg, subsequent encounter: Secondary | ICD-10-CM | POA: Diagnosis not present

## 2023-06-18 DIAGNOSIS — K4090 Unilateral inguinal hernia, without obstruction or gangrene, not specified as recurrent: Secondary | ICD-10-CM | POA: Diagnosis not present

## 2023-06-18 DIAGNOSIS — E039 Hypothyroidism, unspecified: Secondary | ICD-10-CM | POA: Diagnosis not present

## 2023-06-18 DIAGNOSIS — Z4800 Encounter for change or removal of nonsurgical wound dressing: Secondary | ICD-10-CM | POA: Diagnosis not present

## 2023-06-18 DIAGNOSIS — I10 Essential (primary) hypertension: Secondary | ICD-10-CM | POA: Diagnosis not present

## 2023-06-19 DIAGNOSIS — Z01810 Encounter for preprocedural cardiovascular examination: Secondary | ICD-10-CM | POA: Diagnosis not present

## 2023-06-22 DIAGNOSIS — M720 Palmar fascial fibromatosis [Dupuytren]: Secondary | ICD-10-CM | POA: Diagnosis not present

## 2023-06-29 DIAGNOSIS — M720 Palmar fascial fibromatosis [Dupuytren]: Secondary | ICD-10-CM | POA: Diagnosis not present

## 2023-07-07 DIAGNOSIS — K4090 Unilateral inguinal hernia, without obstruction or gangrene, not specified as recurrent: Secondary | ICD-10-CM | POA: Diagnosis not present

## 2023-07-08 DIAGNOSIS — M65331 Trigger finger, right middle finger: Secondary | ICD-10-CM | POA: Diagnosis not present

## 2023-07-08 DIAGNOSIS — M720 Palmar fascial fibromatosis [Dupuytren]: Secondary | ICD-10-CM | POA: Diagnosis not present

## 2023-07-13 DIAGNOSIS — I862 Pelvic varices: Secondary | ICD-10-CM | POA: Diagnosis not present

## 2023-07-13 DIAGNOSIS — I700 Atherosclerosis of aorta: Secondary | ICD-10-CM | POA: Diagnosis not present

## 2023-07-13 DIAGNOSIS — K4090 Unilateral inguinal hernia, without obstruction or gangrene, not specified as recurrent: Secondary | ICD-10-CM | POA: Diagnosis not present

## 2023-07-13 DIAGNOSIS — Z9889 Other specified postprocedural states: Secondary | ICD-10-CM | POA: Diagnosis not present

## 2023-07-13 DIAGNOSIS — R9389 Abnormal findings on diagnostic imaging of other specified body structures: Secondary | ICD-10-CM | POA: Diagnosis not present

## 2023-07-13 DIAGNOSIS — R932 Abnormal findings on diagnostic imaging of liver and biliary tract: Secondary | ICD-10-CM | POA: Diagnosis not present

## 2023-07-13 DIAGNOSIS — K5730 Diverticulosis of large intestine without perforation or abscess without bleeding: Secondary | ICD-10-CM | POA: Diagnosis not present

## 2023-07-19 DIAGNOSIS — M79641 Pain in right hand: Secondary | ICD-10-CM | POA: Diagnosis not present

## 2023-07-26 DIAGNOSIS — M79641 Pain in right hand: Secondary | ICD-10-CM | POA: Diagnosis not present

## 2023-08-02 DIAGNOSIS — M79641 Pain in right hand: Secondary | ICD-10-CM | POA: Diagnosis not present

## 2023-08-02 DIAGNOSIS — M19072 Primary osteoarthritis, left ankle and foot: Secondary | ICD-10-CM | POA: Diagnosis not present

## 2023-08-09 DIAGNOSIS — M79641 Pain in right hand: Secondary | ICD-10-CM | POA: Diagnosis not present

## 2023-08-20 DIAGNOSIS — M79641 Pain in right hand: Secondary | ICD-10-CM | POA: Diagnosis not present

## 2023-09-03 DIAGNOSIS — M79641 Pain in right hand: Secondary | ICD-10-CM | POA: Diagnosis not present

## 2023-10-11 DIAGNOSIS — M19041 Primary osteoarthritis, right hand: Secondary | ICD-10-CM | POA: Diagnosis not present

## 2023-10-11 DIAGNOSIS — M66241 Spontaneous rupture of extensor tendons, right hand: Secondary | ICD-10-CM | POA: Diagnosis not present

## 2023-10-11 DIAGNOSIS — M65331 Trigger finger, right middle finger: Secondary | ICD-10-CM | POA: Diagnosis not present

## 2023-10-17 DIAGNOSIS — M79641 Pain in right hand: Secondary | ICD-10-CM | POA: Diagnosis not present

## 2023-10-18 DIAGNOSIS — M65331 Trigger finger, right middle finger: Secondary | ICD-10-CM | POA: Diagnosis not present

## 2023-10-18 DIAGNOSIS — M19041 Primary osteoarthritis, right hand: Secondary | ICD-10-CM | POA: Diagnosis not present

## 2023-10-18 DIAGNOSIS — M66241 Spontaneous rupture of extensor tendons, right hand: Secondary | ICD-10-CM | POA: Diagnosis not present

## 2023-10-18 DIAGNOSIS — L821 Other seborrheic keratosis: Secondary | ICD-10-CM | POA: Diagnosis not present

## 2023-10-18 DIAGNOSIS — L570 Actinic keratosis: Secondary | ICD-10-CM | POA: Diagnosis not present

## 2023-10-18 DIAGNOSIS — D692 Other nonthrombocytopenic purpura: Secondary | ICD-10-CM | POA: Diagnosis not present

## 2023-10-18 DIAGNOSIS — L814 Other melanin hyperpigmentation: Secondary | ICD-10-CM | POA: Diagnosis not present

## 2023-10-18 DIAGNOSIS — Z85828 Personal history of other malignant neoplasm of skin: Secondary | ICD-10-CM | POA: Diagnosis not present

## 2023-11-04 DIAGNOSIS — M79622 Pain in left upper arm: Secondary | ICD-10-CM | POA: Diagnosis not present

## 2023-11-04 DIAGNOSIS — Z6824 Body mass index (BMI) 24.0-24.9, adult: Secondary | ICD-10-CM | POA: Diagnosis not present

## 2023-11-04 DIAGNOSIS — M79621 Pain in right upper arm: Secondary | ICD-10-CM | POA: Diagnosis not present

## 2023-11-04 DIAGNOSIS — R350 Frequency of micturition: Secondary | ICD-10-CM | POA: Diagnosis not present

## 2023-11-16 DIAGNOSIS — M24841 Other specific joint derangements of right hand, not elsewhere classified: Secondary | ICD-10-CM | POA: Diagnosis not present

## 2023-11-16 DIAGNOSIS — M65331 Trigger finger, right middle finger: Secondary | ICD-10-CM | POA: Diagnosis not present

## 2023-11-16 DIAGNOSIS — M79641 Pain in right hand: Secondary | ICD-10-CM | POA: Diagnosis not present

## 2023-11-16 DIAGNOSIS — Z9889 Other specified postprocedural states: Secondary | ICD-10-CM | POA: Diagnosis not present

## 2023-11-16 DIAGNOSIS — M6289 Other specified disorders of muscle: Secondary | ICD-10-CM | POA: Diagnosis not present

## 2023-11-16 DIAGNOSIS — M25641 Stiffness of right hand, not elsewhere classified: Secondary | ICD-10-CM | POA: Diagnosis not present

## 2023-11-16 DIAGNOSIS — G8918 Other acute postprocedural pain: Secondary | ICD-10-CM | POA: Diagnosis not present

## 2023-11-16 DIAGNOSIS — M24641 Ankylosis, right hand: Secondary | ICD-10-CM | POA: Diagnosis not present

## 2023-11-16 DIAGNOSIS — M66241 Spontaneous rupture of extensor tendons, right hand: Secondary | ICD-10-CM | POA: Diagnosis not present

## 2023-11-29 DIAGNOSIS — Z4789 Encounter for other orthopedic aftercare: Secondary | ICD-10-CM | POA: Diagnosis not present

## 2023-12-02 DIAGNOSIS — Z4789 Encounter for other orthopedic aftercare: Secondary | ICD-10-CM | POA: Diagnosis not present

## 2023-12-09 DIAGNOSIS — I10 Essential (primary) hypertension: Secondary | ICD-10-CM | POA: Diagnosis not present

## 2023-12-09 DIAGNOSIS — Z0000 Encounter for general adult medical examination without abnormal findings: Secondary | ICD-10-CM | POA: Diagnosis not present

## 2023-12-09 DIAGNOSIS — M25512 Pain in left shoulder: Secondary | ICD-10-CM | POA: Diagnosis not present

## 2023-12-09 DIAGNOSIS — E039 Hypothyroidism, unspecified: Secondary | ICD-10-CM | POA: Diagnosis not present

## 2023-12-09 DIAGNOSIS — E7800 Pure hypercholesterolemia, unspecified: Secondary | ICD-10-CM | POA: Diagnosis not present

## 2023-12-09 DIAGNOSIS — R7303 Prediabetes: Secondary | ICD-10-CM | POA: Diagnosis not present

## 2023-12-13 DIAGNOSIS — M25641 Stiffness of right hand, not elsewhere classified: Secondary | ICD-10-CM | POA: Diagnosis not present

## 2023-12-28 DIAGNOSIS — S46212A Strain of muscle, fascia and tendon of other parts of biceps, left arm, initial encounter: Secondary | ICD-10-CM | POA: Diagnosis not present

## 2023-12-28 DIAGNOSIS — M25641 Stiffness of right hand, not elsewhere classified: Secondary | ICD-10-CM | POA: Diagnosis not present

## 2023-12-30 DIAGNOSIS — M7542 Impingement syndrome of left shoulder: Secondary | ICD-10-CM | POA: Diagnosis not present

## 2023-12-30 DIAGNOSIS — M25511 Pain in right shoulder: Secondary | ICD-10-CM | POA: Diagnosis not present

## 2024-01-05 DIAGNOSIS — M25641 Stiffness of right hand, not elsewhere classified: Secondary | ICD-10-CM | POA: Diagnosis not present

## 2024-01-10 DIAGNOSIS — R634 Abnormal weight loss: Secondary | ICD-10-CM | POA: Diagnosis not present

## 2024-01-10 DIAGNOSIS — R8290 Unspecified abnormal findings in urine: Secondary | ICD-10-CM | POA: Diagnosis not present

## 2024-01-10 DIAGNOSIS — R1030 Lower abdominal pain, unspecified: Secondary | ICD-10-CM | POA: Diagnosis not present

## 2024-01-10 DIAGNOSIS — M25641 Stiffness of right hand, not elsewhere classified: Secondary | ICD-10-CM | POA: Diagnosis not present

## 2024-01-10 DIAGNOSIS — R1013 Epigastric pain: Secondary | ICD-10-CM | POA: Diagnosis not present

## 2024-01-26 DIAGNOSIS — M25641 Stiffness of right hand, not elsewhere classified: Secondary | ICD-10-CM | POA: Diagnosis not present

## 2024-02-03 DIAGNOSIS — M25641 Stiffness of right hand, not elsewhere classified: Secondary | ICD-10-CM | POA: Diagnosis not present

## 2024-02-14 DIAGNOSIS — M25641 Stiffness of right hand, not elsewhere classified: Secondary | ICD-10-CM | POA: Diagnosis not present

## 2024-03-06 ENCOUNTER — Telehealth: Payer: Self-pay

## 2024-03-06 NOTE — Telephone Encounter (Signed)
 Called and rescheduled 1/23 appt to 0940. She is aware of appt.

## 2024-03-08 DIAGNOSIS — H25813 Combined forms of age-related cataract, bilateral: Secondary | ICD-10-CM | POA: Diagnosis not present

## 2024-03-08 DIAGNOSIS — H5203 Hypermetropia, bilateral: Secondary | ICD-10-CM | POA: Diagnosis not present

## 2024-03-08 DIAGNOSIS — H52203 Unspecified astigmatism, bilateral: Secondary | ICD-10-CM | POA: Diagnosis not present

## 2024-03-08 DIAGNOSIS — H40013 Open angle with borderline findings, low risk, bilateral: Secondary | ICD-10-CM | POA: Diagnosis not present

## 2024-03-28 DIAGNOSIS — R3915 Urgency of urination: Secondary | ICD-10-CM | POA: Diagnosis not present

## 2024-05-09 ENCOUNTER — Inpatient Hospital Stay: Payer: Medicare Other | Attending: Hematology and Oncology

## 2024-05-09 DIAGNOSIS — D472 Monoclonal gammopathy: Secondary | ICD-10-CM

## 2024-05-09 LAB — COMPREHENSIVE METABOLIC PANEL WITH GFR
ALT: 30 U/L (ref 0–44)
AST: 33 U/L (ref 15–41)
Albumin: 4.7 g/dL (ref 3.5–5.0)
Alkaline Phosphatase: 71 U/L (ref 38–126)
Anion gap: 11 (ref 5–15)
BUN: 17 mg/dL (ref 8–23)
CO2: 28 mmol/L (ref 22–32)
Calcium: 9.8 mg/dL (ref 8.9–10.3)
Chloride: 97 mmol/L — ABNORMAL LOW (ref 98–111)
Creatinine, Ser: 0.9 mg/dL (ref 0.44–1.00)
GFR, Estimated: >60 mL/min
Glucose, Bld: 101 mg/dL — ABNORMAL HIGH (ref 70–99)
Potassium: 4.6 mmol/L (ref 3.5–5.1)
Sodium: 136 mmol/L (ref 135–145)
Total Bilirubin: 0.6 mg/dL (ref 0.0–1.2)
Total Protein: 7.8 g/dL (ref 6.5–8.1)

## 2024-05-09 LAB — CBC WITH DIFFERENTIAL/PLATELET
Abs Immature Granulocytes: 0.02 K/uL (ref 0.00–0.07)
Basophils Absolute: 0.1 K/uL (ref 0.0–0.1)
Basophils Relative: 1 %
Eosinophils Absolute: 0.3 K/uL (ref 0.0–0.5)
Eosinophils Relative: 4 %
HCT: 41.9 % (ref 36.0–46.0)
Hemoglobin: 13.9 g/dL (ref 12.0–15.0)
Immature Granulocytes: 0 %
Lymphocytes Relative: 22 %
Lymphs Abs: 1.5 K/uL (ref 0.7–4.0)
MCH: 28 pg (ref 26.0–34.0)
MCHC: 33.2 g/dL (ref 30.0–36.0)
MCV: 84.5 fL (ref 80.0–100.0)
Monocytes Absolute: 0.4 K/uL (ref 0.1–1.0)
Monocytes Relative: 6 %
Neutro Abs: 4.4 K/uL (ref 1.7–7.7)
Neutrophils Relative %: 67 %
Platelets: 258 K/uL (ref 150–400)
RBC: 4.96 MIL/uL (ref 3.87–5.11)
RDW: 13.4 % (ref 11.5–15.5)
WBC: 6.6 K/uL (ref 4.0–10.5)
nRBC: 0 % (ref 0.0–0.2)

## 2024-05-11 LAB — KAPPA/LAMBDA LIGHT CHAINS
Kappa free light chain: 19.4 mg/L (ref 3.3–19.4)
Kappa, lambda light chain ratio: 1.41 (ref 0.26–1.65)
Lambda free light chains: 13.8 mg/L (ref 5.7–26.3)

## 2024-05-12 LAB — MULTIPLE MYELOMA PANEL, SERUM
Albumin SerPl Elph-Mcnc: 3.9 g/dL (ref 2.9–4.4)
Albumin/Glob SerPl: 1.2 (ref 0.7–1.7)
Alpha 1: 0.2 g/dL (ref 0.0–0.4)
Alpha2 Glob SerPl Elph-Mcnc: 0.9 g/dL (ref 0.4–1.0)
B-Globulin SerPl Elph-Mcnc: 1 g/dL (ref 0.7–1.3)
Gamma Glob SerPl Elph-Mcnc: 1.2 g/dL (ref 0.4–1.8)
Globulin, Total: 3.4 g/dL (ref 2.2–3.9)
IgA: 134 mg/dL (ref 64–422)
IgG (Immunoglobin G), Serum: 1268 mg/dL (ref 586–1602)
IgM (Immunoglobulin M), Srm: 107 mg/dL (ref 26–217)
M Protein SerPl Elph-Mcnc: 0.2 g/dL — ABNORMAL HIGH
Total Protein ELP: 7.3 g/dL (ref 6.0–8.5)

## 2024-05-19 ENCOUNTER — Ambulatory Visit: Payer: Medicare Other | Admitting: Hematology and Oncology

## 2024-05-19 ENCOUNTER — Inpatient Hospital Stay (HOSPITAL_BASED_OUTPATIENT_CLINIC_OR_DEPARTMENT_OTHER): Admitting: Hematology and Oncology

## 2024-05-19 ENCOUNTER — Encounter: Payer: Self-pay | Admitting: Hematology and Oncology

## 2024-05-19 VITALS — BP 156/64 | HR 69 | Temp 98.6°F | Resp 18 | Ht 64.5 in | Wt 141.4 lb

## 2024-05-19 DIAGNOSIS — D472 Monoclonal gammopathy: Secondary | ICD-10-CM | POA: Diagnosis not present

## 2024-05-19 NOTE — Progress Notes (Signed)
 [facility] OFFICE PROGRESS NOTE  Patient Care Team: Dayna Motto, DO as PCP - General (Family Medicine) Dyane Rush, MD (Inactive) as Consulting Physician (Gastroenterology)  ASSESSMENT & PLAN:  Assessment & Plan MGUS (monoclonal gammopathy of unknown significance) The patient was diagnosed with MGUS since 2023 after extensive workup for neuropathy I review all the labs with her Overall, MGUS is stable and she has no evidence of endorgan damage The neuropathy is not related to her MGUS I will continue to see her once a year  No orders of the defined types were placed in this encounter.    INTERVAL HISTORY: she returns for surveillance follow-up for diagnosis of MGUS Patient denies recurrent infection or bone pain We reviewed recent CBC, CMP and myeloma panel results  PHYSICAL EXAMINATION: ECOG PERFORMANCE STATUS: 0 - Asymptomatic  Vitals:   05/19/24 1004  BP: (!) 156/64  Pulse: 69  Resp: 18  Temp: 98.6 F (37 C)  SpO2: 100%

## 2024-05-19 NOTE — Assessment & Plan Note (Addendum)
 The patient was diagnosed with MGUS since 2023 after extensive workup for neuropathy I review all the labs with her Overall, MGUS is stable and she has no evidence of endorgan damage The neuropathy is not related to her MGUS I will continue to see her once a year

## 2025-05-08 ENCOUNTER — Inpatient Hospital Stay

## 2025-05-18 ENCOUNTER — Inpatient Hospital Stay: Admitting: Hematology and Oncology
